# Patient Record
Sex: Female | Born: 1945 | Race: White | Hispanic: No | Smoking: Current every day smoker
Health system: Southern US, Community
[De-identification: ages and names within clinical notes are randomized; demographics above are authoritative.]

## PROBLEM LIST (undated history)

## (undated) DIAGNOSIS — M7070 Other bursitis of hip, unspecified hip: Secondary | ICD-10-CM

## (undated) DIAGNOSIS — T7840XA Allergy, unspecified, initial encounter: Secondary | ICD-10-CM

## (undated) DIAGNOSIS — G5600 Carpal tunnel syndrome, unspecified upper limb: Secondary | ICD-10-CM

## (undated) DIAGNOSIS — C801 Malignant (primary) neoplasm, unspecified: Secondary | ICD-10-CM

## (undated) DIAGNOSIS — S32592A Other specified fracture of left pubis, initial encounter for closed fracture: Secondary | ICD-10-CM

## (undated) DIAGNOSIS — F419 Anxiety disorder, unspecified: Secondary | ICD-10-CM

## (undated) DIAGNOSIS — M719 Bursopathy, unspecified: Secondary | ICD-10-CM

## (undated) DIAGNOSIS — K529 Noninfective gastroenteritis and colitis, unspecified: Secondary | ICD-10-CM

## (undated) DIAGNOSIS — M797 Fibromyalgia: Secondary | ICD-10-CM

## (undated) DIAGNOSIS — M1990 Unspecified osteoarthritis, unspecified site: Secondary | ICD-10-CM

## (undated) DIAGNOSIS — T148XXA Other injury of unspecified body region, initial encounter: Secondary | ICD-10-CM

## (undated) DIAGNOSIS — J189 Pneumonia, unspecified organism: Secondary | ICD-10-CM

## (undated) DIAGNOSIS — E785 Hyperlipidemia, unspecified: Secondary | ICD-10-CM

## (undated) DIAGNOSIS — K589 Irritable bowel syndrome without diarrhea: Secondary | ICD-10-CM

## (undated) DIAGNOSIS — H52209 Unspecified astigmatism, unspecified eye: Secondary | ICD-10-CM

## (undated) DIAGNOSIS — K219 Gastro-esophageal reflux disease without esophagitis: Secondary | ICD-10-CM

## (undated) DIAGNOSIS — N809 Endometriosis, unspecified: Secondary | ICD-10-CM

## (undated) DIAGNOSIS — H269 Unspecified cataract: Secondary | ICD-10-CM

## (undated) DIAGNOSIS — F32A Depression, unspecified: Secondary | ICD-10-CM

## (undated) DIAGNOSIS — K8020 Calculus of gallbladder without cholecystitis without obstruction: Secondary | ICD-10-CM

## (undated) DIAGNOSIS — I10 Essential (primary) hypertension: Secondary | ICD-10-CM

## (undated) DIAGNOSIS — H47323 Drusen of optic disc, bilateral: Secondary | ICD-10-CM

## (undated) DIAGNOSIS — D649 Anemia, unspecified: Secondary | ICD-10-CM

## (undated) DIAGNOSIS — F329 Major depressive disorder, single episode, unspecified: Secondary | ICD-10-CM

## (undated) HISTORY — DX: Other injury of unspecified body region, initial encounter: T14.8XXA

## (undated) HISTORY — DX: Hyperlipidemia, unspecified: E78.5

## (undated) HISTORY — DX: Unspecified astigmatism, unspecified eye: H52.209

## (undated) HISTORY — DX: Fibromyalgia: M79.7

## (undated) HISTORY — DX: Gastro-esophageal reflux disease without esophagitis: K21.9

## (undated) HISTORY — DX: Carpal tunnel syndrome, unspecified upper limb: G56.00

## (undated) HISTORY — DX: Calculus of gallbladder without cholecystitis without obstruction: K80.20

## (undated) HISTORY — DX: Essential (primary) hypertension: I10

## (undated) HISTORY — PX: ELBOW SURGERY: SHX618

## (undated) HISTORY — DX: Unspecified osteoarthritis, unspecified site: M19.90

## (undated) HISTORY — PX: APPENDECTOMY: SHX54

## (undated) HISTORY — DX: Unspecified cataract: H26.9

## (undated) HISTORY — DX: Irritable bowel syndrome, unspecified: K58.9

## (undated) HISTORY — DX: Anemia, unspecified: D64.9

## (undated) HISTORY — DX: Anxiety disorder, unspecified: F41.9

## (undated) HISTORY — DX: Pneumonia, unspecified organism: J18.9

## (undated) HISTORY — DX: Malignant (primary) neoplasm, unspecified: C80.1

## (undated) HISTORY — DX: Bursopathy, unspecified: M71.9

## (undated) HISTORY — DX: Allergy, unspecified, initial encounter: T78.40XA

## (undated) HISTORY — DX: Endometriosis, unspecified: N80.9

## (undated) HISTORY — PX: CARPAL TUNNEL RELEASE: SHX101

## (undated) HISTORY — PX: CYST EXCISION: SHX5701

## (undated) HISTORY — DX: Noninfective gastroenteritis and colitis, unspecified: K52.9

## (undated) HISTORY — PX: LUMBAR FUSION: SHX111

## (undated) HISTORY — DX: Major depressive disorder, single episode, unspecified: F32.9

## (undated) HISTORY — DX: Other bursitis of hip, unspecified hip: M70.70

## (undated) HISTORY — DX: Depression, unspecified: F32.A

## (undated) HISTORY — DX: Drusen of optic disc, bilateral: H47.323

---

## 1999-03-20 ENCOUNTER — Emergency Department (HOSPITAL_COMMUNITY): Admission: EM | Admit: 1999-03-20 | Discharge: 1999-03-20 | Payer: Self-pay | Admitting: Emergency Medicine

## 1999-03-20 ENCOUNTER — Encounter: Payer: Self-pay | Admitting: Emergency Medicine

## 1999-03-30 ENCOUNTER — Other Ambulatory Visit: Admission: RE | Admit: 1999-03-30 | Discharge: 1999-03-30 | Payer: Self-pay | Admitting: Obstetrics and Gynecology

## 2000-07-01 ENCOUNTER — Other Ambulatory Visit: Admission: RE | Admit: 2000-07-01 | Discharge: 2000-07-01 | Payer: Self-pay | Admitting: Obstetrics and Gynecology

## 2001-03-29 ENCOUNTER — Emergency Department (HOSPITAL_COMMUNITY): Admission: EM | Admit: 2001-03-29 | Discharge: 2001-03-29 | Payer: Self-pay | Admitting: Emergency Medicine

## 2001-03-29 ENCOUNTER — Encounter: Payer: Self-pay | Admitting: Emergency Medicine

## 2001-07-10 ENCOUNTER — Emergency Department (HOSPITAL_COMMUNITY): Admission: EM | Admit: 2001-07-10 | Discharge: 2001-07-10 | Payer: Self-pay | Admitting: Emergency Medicine

## 2001-07-10 ENCOUNTER — Encounter: Payer: Self-pay | Admitting: Emergency Medicine

## 2001-09-19 ENCOUNTER — Encounter: Admission: RE | Admit: 2001-09-19 | Discharge: 2001-10-27 | Payer: Self-pay | Admitting: Internal Medicine

## 2001-10-21 ENCOUNTER — Ambulatory Visit (HOSPITAL_COMMUNITY): Admission: RE | Admit: 2001-10-21 | Discharge: 2001-10-21 | Payer: Self-pay | Admitting: Gastroenterology

## 2001-11-06 ENCOUNTER — Other Ambulatory Visit: Admission: RE | Admit: 2001-11-06 | Discharge: 2001-11-06 | Payer: Self-pay | Admitting: Obstetrics and Gynecology

## 2002-09-09 ENCOUNTER — Encounter: Payer: Self-pay | Admitting: Gastroenterology

## 2002-09-09 ENCOUNTER — Encounter: Admission: RE | Admit: 2002-09-09 | Discharge: 2002-09-09 | Payer: Self-pay | Admitting: Gastroenterology

## 2002-09-21 ENCOUNTER — Encounter: Admission: RE | Admit: 2002-09-21 | Discharge: 2002-09-21 | Payer: Self-pay | Admitting: Gastroenterology

## 2002-09-21 ENCOUNTER — Encounter: Payer: Self-pay | Admitting: Gastroenterology

## 2003-03-26 ENCOUNTER — Other Ambulatory Visit: Admission: RE | Admit: 2003-03-26 | Discharge: 2003-03-26 | Payer: Self-pay | Admitting: Obstetrics and Gynecology

## 2004-06-08 ENCOUNTER — Other Ambulatory Visit: Admission: RE | Admit: 2004-06-08 | Discharge: 2004-06-08 | Payer: Self-pay | Admitting: Obstetrics and Gynecology

## 2004-07-22 ENCOUNTER — Inpatient Hospital Stay (HOSPITAL_COMMUNITY): Admission: AD | Admit: 2004-07-22 | Discharge: 2004-07-23 | Payer: Self-pay | Admitting: Obstetrics and Gynecology

## 2004-07-27 ENCOUNTER — Encounter (INDEPENDENT_AMBULATORY_CARE_PROVIDER_SITE_OTHER): Payer: Self-pay | Admitting: Specialist

## 2004-07-27 ENCOUNTER — Ambulatory Visit (HOSPITAL_COMMUNITY): Admission: RE | Admit: 2004-07-27 | Discharge: 2004-07-27 | Payer: Self-pay | Admitting: Obstetrics and Gynecology

## 2004-09-19 ENCOUNTER — Ambulatory Visit: Payer: Self-pay | Admitting: Psychiatry

## 2004-09-19 ENCOUNTER — Encounter: Payer: Self-pay | Admitting: *Deleted

## 2004-09-20 ENCOUNTER — Inpatient Hospital Stay (HOSPITAL_COMMUNITY): Admission: EM | Admit: 2004-09-20 | Discharge: 2004-09-23 | Payer: Self-pay | Admitting: Psychiatry

## 2004-10-12 ENCOUNTER — Other Ambulatory Visit: Admission: RE | Admit: 2004-10-12 | Discharge: 2004-10-12 | Payer: Self-pay | Admitting: Obstetrics and Gynecology

## 2005-09-06 ENCOUNTER — Other Ambulatory Visit: Admission: RE | Admit: 2005-09-06 | Discharge: 2005-09-06 | Payer: Self-pay | Admitting: *Deleted

## 2006-10-24 DIAGNOSIS — C801 Malignant (primary) neoplasm, unspecified: Secondary | ICD-10-CM

## 2006-10-24 HISTORY — DX: Malignant (primary) neoplasm, unspecified: C80.1

## 2006-11-26 ENCOUNTER — Encounter: Admission: RE | Admit: 2006-11-26 | Discharge: 2006-11-26 | Payer: Self-pay | Admitting: Radiology

## 2006-12-02 ENCOUNTER — Encounter: Admission: RE | Admit: 2006-12-02 | Discharge: 2006-12-02 | Payer: Self-pay | Admitting: Family Medicine

## 2006-12-05 ENCOUNTER — Encounter: Admission: RE | Admit: 2006-12-05 | Discharge: 2006-12-05 | Payer: Self-pay | Admitting: Family Medicine

## 2006-12-06 ENCOUNTER — Encounter: Admission: RE | Admit: 2006-12-06 | Discharge: 2006-12-06 | Payer: Self-pay | Admitting: Family Medicine

## 2006-12-10 ENCOUNTER — Encounter: Admission: RE | Admit: 2006-12-10 | Discharge: 2006-12-10 | Payer: Self-pay | Admitting: General Surgery

## 2006-12-11 ENCOUNTER — Encounter (INDEPENDENT_AMBULATORY_CARE_PROVIDER_SITE_OTHER): Payer: Self-pay | Admitting: *Deleted

## 2006-12-11 ENCOUNTER — Ambulatory Visit (HOSPITAL_BASED_OUTPATIENT_CLINIC_OR_DEPARTMENT_OTHER): Admission: RE | Admit: 2006-12-11 | Discharge: 2006-12-11 | Payer: Self-pay | Admitting: General Surgery

## 2006-12-11 HISTORY — PX: BREAST SURGERY: SHX581

## 2006-12-12 ENCOUNTER — Ambulatory Visit: Payer: Self-pay | Admitting: Oncology

## 2006-12-25 LAB — CBC WITH DIFFERENTIAL/PLATELET
EOS%: 1.2 % (ref 0.0–7.0)
Eosinophils Absolute: 0.1 10*3/uL (ref 0.0–0.5)
MCV: 80.6 fL — ABNORMAL LOW (ref 81.0–101.0)
MONO%: 8.4 % (ref 0.0–13.0)
NEUT#: 3.9 10*3/uL (ref 1.5–6.5)
RBC: 4.16 10*6/uL (ref 3.70–5.32)
RDW: 17 % — ABNORMAL HIGH (ref 11.3–14.5)
lymph#: 2.2 10*3/uL (ref 0.9–3.3)

## 2006-12-25 LAB — COMPREHENSIVE METABOLIC PANEL
ALT: 45 U/L — ABNORMAL HIGH (ref 0–35)
BUN: 16 mg/dL (ref 6–23)
Calcium: 9.1 mg/dL (ref 8.4–10.5)
Creatinine, Ser: 0.82 mg/dL (ref 0.40–1.20)
Potassium: 4.4 mEq/L (ref 3.5–5.3)
Sodium: 138 mEq/L (ref 135–145)
Total Bilirubin: 0.4 mg/dL (ref 0.3–1.2)

## 2006-12-25 LAB — LACTATE DEHYDROGENASE: LDH: 235 U/L (ref 94–250)

## 2006-12-26 ENCOUNTER — Ambulatory Visit (HOSPITAL_COMMUNITY): Admission: RE | Admit: 2006-12-26 | Discharge: 2006-12-26 | Payer: Self-pay | Admitting: Oncology

## 2006-12-27 ENCOUNTER — Ambulatory Visit (HOSPITAL_COMMUNITY): Admission: RE | Admit: 2006-12-27 | Discharge: 2006-12-27 | Payer: Self-pay | Admitting: Oncology

## 2006-12-30 ENCOUNTER — Ambulatory Visit: Admission: RE | Admit: 2006-12-30 | Discharge: 2007-03-19 | Payer: Self-pay | Admitting: Radiation Oncology

## 2007-01-03 LAB — CBC & DIFF AND RETIC
BASO%: 0.3 % (ref 0.0–2.0)
Basophils Absolute: 0 10*3/uL (ref 0.0–0.1)
HCT: 33.1 % — ABNORMAL LOW (ref 34.8–46.6)
HGB: 10.8 g/dL — ABNORMAL LOW (ref 11.6–15.9)
LYMPH%: 43.2 % (ref 14.0–48.0)
MCH: 26.6 pg (ref 26.0–34.0)
MCHC: 32.5 g/dL (ref 32.0–36.0)
MONO#: 0.5 10*3/uL (ref 0.1–0.9)
NEUT%: 45.9 % (ref 39.6–76.8)
Platelets: 324 10*3/uL (ref 145–400)
WBC: 5.9 10*3/uL (ref 3.9–10.0)

## 2007-01-03 LAB — MORPHOLOGY

## 2007-01-03 LAB — FERRITIN: Ferritin: 7 ng/mL — ABNORMAL LOW (ref 10–291)

## 2007-01-03 LAB — IRON AND TIBC
%SAT: 8 % — ABNORMAL LOW (ref 20–55)
Iron: 31 ug/dL — ABNORMAL LOW (ref 42–145)
TIBC: 375 ug/dL (ref 250–470)

## 2007-01-06 ENCOUNTER — Ambulatory Visit (HOSPITAL_COMMUNITY): Admission: RE | Admit: 2007-01-06 | Discharge: 2007-01-06 | Payer: Self-pay | Admitting: Oncology

## 2007-01-17 LAB — CBC & DIFF AND RETIC
BASO%: 0.3 % (ref 0.0–2.0)
LYMPH%: 36.3 % (ref 14.0–48.0)
MCHC: 32.7 g/dL (ref 32.0–36.0)
MONO#: 0.7 10*3/uL (ref 0.1–0.9)
Platelets: 270 10*3/uL (ref 145–400)
RBC: 4.18 10*6/uL (ref 3.70–5.32)
Retic %: 1.3 % (ref 0.4–2.3)
WBC: 5.9 10*3/uL (ref 3.9–10.0)

## 2007-01-31 ENCOUNTER — Ambulatory Visit: Payer: Self-pay | Admitting: Oncology

## 2007-01-31 LAB — FECAL OCCULT BLOOD, GUAIAC: Occult Blood: NEGATIVE

## 2007-02-26 ENCOUNTER — Ambulatory Visit: Payer: Self-pay | Admitting: Psychiatry

## 2007-03-03 ENCOUNTER — Ambulatory Visit: Payer: Self-pay | Admitting: Psychiatry

## 2007-03-18 ENCOUNTER — Ambulatory Visit: Payer: Self-pay | Admitting: Oncology

## 2007-03-19 ENCOUNTER — Ambulatory Visit: Admission: RE | Admit: 2007-03-19 | Discharge: 2007-05-11 | Payer: Self-pay | Admitting: Radiation Oncology

## 2007-03-21 LAB — CBC WITH DIFFERENTIAL/PLATELET
BASO%: 0.3 % (ref 0.0–2.0)
EOS%: 2.9 % (ref 0.0–7.0)
HCT: 33 % — ABNORMAL LOW (ref 34.8–46.6)
LYMPH%: 31.5 % (ref 14.0–48.0)
MCH: 27 pg (ref 26.0–34.0)
MCHC: 33.7 g/dL (ref 32.0–36.0)
MONO%: 9.4 % (ref 0.0–13.0)
NEUT%: 55.9 % (ref 39.6–76.8)
Platelets: 275 10*3/uL (ref 145–400)
lymph#: 1.2 10*3/uL (ref 0.9–3.3)

## 2007-03-26 LAB — VITAMIN D PNL(25-HYDRXY+1,25-DIHY)-BLD: Vit D, 25-Hydroxy: 13 ng/mL — ABNORMAL LOW (ref 20–57)

## 2007-03-26 LAB — COMPREHENSIVE METABOLIC PANEL
BUN: 14 mg/dL (ref 6–23)
CO2: 27 mEq/L (ref 19–32)
Calcium: 9.1 mg/dL (ref 8.4–10.5)
Chloride: 101 mEq/L (ref 96–112)
Creatinine, Ser: 0.88 mg/dL (ref 0.40–1.20)
Glucose, Bld: 137 mg/dL — ABNORMAL HIGH (ref 70–99)

## 2007-06-11 ENCOUNTER — Ambulatory Visit: Payer: Self-pay | Admitting: Oncology

## 2007-06-18 LAB — COMPREHENSIVE METABOLIC PANEL
ALT: 12 U/L (ref 0–35)
AST: 17 U/L (ref 0–37)
BUN: 20 mg/dL (ref 6–23)
Calcium: 9.2 mg/dL (ref 8.4–10.5)
Chloride: 106 mEq/L (ref 96–112)
Creatinine, Ser: 0.81 mg/dL (ref 0.40–1.20)
Total Bilirubin: 0.3 mg/dL (ref 0.3–1.2)

## 2007-06-18 LAB — CBC WITH DIFFERENTIAL/PLATELET
BASO%: 0.3 % (ref 0.0–2.0)
Basophils Absolute: 0 10*3/uL (ref 0.0–0.1)
EOS%: 6.5 % (ref 0.0–7.0)
HCT: 29.8 % — ABNORMAL LOW (ref 34.8–46.6)
HGB: 9.9 g/dL — ABNORMAL LOW (ref 11.6–15.9)
LYMPH%: 23.6 % (ref 14.0–48.0)
MCH: 26.9 pg (ref 26.0–34.0)
MCHC: 33.3 g/dL (ref 32.0–36.0)
MCV: 80.7 fL — ABNORMAL LOW (ref 81.0–101.0)
MONO%: 8.2 % (ref 0.0–13.0)
NEUT%: 61.4 % (ref 39.6–76.8)
Platelets: 381 10*3/uL (ref 145–400)
lymph#: 1.2 10*3/uL (ref 0.9–3.3)

## 2007-06-18 LAB — LACTATE DEHYDROGENASE: LDH: 266 U/L — ABNORMAL HIGH (ref 94–250)

## 2007-10-08 ENCOUNTER — Ambulatory Visit: Payer: Self-pay | Admitting: Oncology

## 2007-10-10 LAB — CBC & DIFF AND RETIC
Basophils Absolute: 0 10*3/uL (ref 0.0–0.1)
EOS%: 2.8 % (ref 0.0–7.0)
Eosinophils Absolute: 0.2 10*3/uL (ref 0.0–0.5)
HGB: 11.1 g/dL — ABNORMAL LOW (ref 11.6–15.9)
LYMPH%: 33.6 % (ref 14.0–48.0)
MCH: 27.5 pg (ref 26.0–34.0)
MCV: 79.4 fL — ABNORMAL LOW (ref 81.0–101.0)
MONO%: 9 % (ref 0.0–13.0)
NEUT%: 54.3 % (ref 39.6–76.8)
Platelets: 291 10*3/uL (ref 145–400)
RDW: 16.1 % — ABNORMAL HIGH (ref 11.3–14.5)
RETIC #: 47.4 10*3/uL (ref 19.7–115.1)

## 2007-10-10 LAB — LACTATE DEHYDROGENASE: LDH: 210 U/L (ref 94–250)

## 2007-10-10 LAB — COMPREHENSIVE METABOLIC PANEL
Albumin: 4.1 g/dL (ref 3.5–5.2)
BUN: 19 mg/dL (ref 6–23)
CO2: 22 mEq/L (ref 19–32)
Calcium: 8.7 mg/dL (ref 8.4–10.5)
Chloride: 104 mEq/L (ref 96–112)
Creatinine, Ser: 0.87 mg/dL (ref 0.40–1.20)
Glucose, Bld: 129 mg/dL — ABNORMAL HIGH (ref 70–99)
Potassium: 3.7 mEq/L (ref 3.5–5.3)

## 2007-10-10 LAB — FERRITIN: Ferritin: 5 ng/mL — ABNORMAL LOW (ref 10–291)

## 2007-10-10 LAB — CANCER ANTIGEN 27.29: CA 27.29: 30 U/mL (ref 0–39)

## 2007-10-10 LAB — IRON AND TIBC
Iron: 36 ug/dL — ABNORMAL LOW (ref 42–145)
UIBC: 315 ug/dL

## 2007-10-10 LAB — MORPHOLOGY: PLT EST: ADEQUATE

## 2007-10-21 ENCOUNTER — Ambulatory Visit: Payer: Self-pay | Admitting: Physical Medicine & Rehabilitation

## 2007-10-21 ENCOUNTER — Encounter
Admission: RE | Admit: 2007-10-21 | Discharge: 2007-10-22 | Payer: Self-pay | Admitting: Physical Medicine & Rehabilitation

## 2007-11-03 ENCOUNTER — Encounter: Admission: RE | Admit: 2007-11-03 | Discharge: 2007-12-01 | Payer: Self-pay | Admitting: Oncology

## 2007-11-16 ENCOUNTER — Ambulatory Visit: Payer: Self-pay | Admitting: Cardiovascular Disease

## 2007-11-16 ENCOUNTER — Ambulatory Visit: Payer: Self-pay | Admitting: Emergency Medicine

## 2007-11-16 ENCOUNTER — Inpatient Hospital Stay (HOSPITAL_COMMUNITY): Admission: EM | Admit: 2007-11-16 | Discharge: 2007-12-15 | Payer: Self-pay | Admitting: Emergency Medicine

## 2007-11-18 ENCOUNTER — Encounter: Payer: Self-pay | Admitting: Emergency Medicine

## 2007-11-24 ENCOUNTER — Ambulatory Visit: Payer: Self-pay | Admitting: Vascular Surgery

## 2008-02-10 ENCOUNTER — Ambulatory Visit: Payer: Self-pay | Admitting: Oncology

## 2008-02-27 ENCOUNTER — Encounter: Payer: Self-pay | Admitting: Internal Medicine

## 2008-03-05 ENCOUNTER — Encounter: Payer: Self-pay | Admitting: Critical Care Medicine

## 2008-09-07 ENCOUNTER — Ambulatory Visit: Payer: Self-pay | Admitting: Oncology

## 2008-09-10 ENCOUNTER — Telehealth (INDEPENDENT_AMBULATORY_CARE_PROVIDER_SITE_OTHER): Payer: Self-pay | Admitting: *Deleted

## 2008-09-16 ENCOUNTER — Ambulatory Visit: Payer: Self-pay | Admitting: Internal Medicine

## 2008-09-16 DIAGNOSIS — J984 Other disorders of lung: Secondary | ICD-10-CM

## 2008-09-16 DIAGNOSIS — F341 Dysthymic disorder: Secondary | ICD-10-CM

## 2008-09-16 DIAGNOSIS — Z8719 Personal history of other diseases of the digestive system: Secondary | ICD-10-CM

## 2008-09-16 DIAGNOSIS — R05 Cough: Secondary | ICD-10-CM

## 2008-09-16 DIAGNOSIS — E785 Hyperlipidemia, unspecified: Secondary | ICD-10-CM | POA: Insufficient documentation

## 2008-09-16 LAB — CBC WITH DIFFERENTIAL/PLATELET
BASO%: 0.3 % (ref 0.0–2.0)
LYMPH%: 29.9 % (ref 14.0–48.0)
MCH: 31.9 pg (ref 26.0–34.0)
MCHC: 34.2 g/dL (ref 32.0–36.0)
MCV: 93.3 fL (ref 81.0–101.0)
MONO%: 7.9 % (ref 0.0–13.0)
Platelets: 218 10*3/uL (ref 145–400)
RBC: 4.26 10*6/uL (ref 3.70–5.32)

## 2008-09-17 LAB — COMPREHENSIVE METABOLIC PANEL
ALT: 14 U/L (ref 0–35)
Alkaline Phosphatase: 59 U/L (ref 39–117)
Glucose, Bld: 103 mg/dL — ABNORMAL HIGH (ref 70–99)
Sodium: 140 mEq/L (ref 135–145)
Total Bilirubin: 0.4 mg/dL (ref 0.3–1.2)
Total Protein: 7.2 g/dL (ref 6.0–8.3)

## 2008-09-17 LAB — VITAMIN D 25 HYDROXY (VIT D DEFICIENCY, FRACTURES): Vit D, 25-Hydroxy: 14 ng/mL — ABNORMAL LOW (ref 30–89)

## 2008-09-17 LAB — CANCER ANTIGEN 27.29: CA 27.29: 31 U/mL (ref 0–39)

## 2008-09-20 ENCOUNTER — Ambulatory Visit: Payer: Self-pay | Admitting: Cardiovascular Disease

## 2008-09-28 ENCOUNTER — Telehealth (INDEPENDENT_AMBULATORY_CARE_PROVIDER_SITE_OTHER): Payer: Self-pay | Admitting: *Deleted

## 2008-10-21 ENCOUNTER — Ambulatory Visit: Payer: Self-pay | Admitting: Internal Medicine

## 2008-12-22 ENCOUNTER — Telehealth (INDEPENDENT_AMBULATORY_CARE_PROVIDER_SITE_OTHER): Payer: Self-pay | Admitting: *Deleted

## 2008-12-28 ENCOUNTER — Ambulatory Visit: Payer: Self-pay | Admitting: Internal Medicine

## 2009-02-10 ENCOUNTER — Telehealth (INDEPENDENT_AMBULATORY_CARE_PROVIDER_SITE_OTHER): Payer: Self-pay | Admitting: *Deleted

## 2009-03-15 ENCOUNTER — Encounter: Admission: RE | Admit: 2009-03-15 | Discharge: 2009-03-15 | Payer: Self-pay | Admitting: General Surgery

## 2009-04-06 ENCOUNTER — Ambulatory Visit: Payer: Self-pay | Admitting: Oncology

## 2009-04-08 LAB — CBC & DIFF AND RETIC
BASO%: 0.3 % (ref 0.0–2.0)
LYMPH%: 38.8 % (ref 14.0–49.7)
MCHC: 34.9 g/dL (ref 31.5–36.0)
MONO#: 0.3 10*3/uL (ref 0.1–0.9)
MONO%: 7.2 % (ref 0.0–14.0)
Platelets: 198 10*3/uL (ref 145–400)
RBC: 4.17 10*6/uL (ref 3.70–5.45)
RDW: 12.9 % (ref 11.2–14.5)
Retic %: 1.6 % (ref 0.4–2.3)
WBC: 4.6 10*3/uL (ref 3.9–10.3)

## 2009-04-11 LAB — COMPREHENSIVE METABOLIC PANEL
ALT: 14 U/L (ref 0–35)
AST: 15 U/L (ref 0–37)
Albumin: 4.2 g/dL (ref 3.5–5.2)
Alkaline Phosphatase: 59 U/L (ref 39–117)
BUN: 15 mg/dL (ref 6–23)
CO2: 27 mEq/L (ref 19–32)
Calcium: 9.2 mg/dL (ref 8.4–10.5)
Chloride: 102 mEq/L (ref 96–112)
Creatinine, Ser: 0.83 mg/dL (ref 0.40–1.20)
Glucose, Bld: 113 mg/dL — ABNORMAL HIGH (ref 70–99)
Potassium: 3.9 mEq/L (ref 3.5–5.3)
Sodium: 138 mEq/L (ref 135–145)
Total Bilirubin: 0.3 mg/dL (ref 0.3–1.2)
Total Protein: 7 g/dL (ref 6.0–8.3)

## 2009-04-11 LAB — LACTATE DEHYDROGENASE: LDH: 169 U/L (ref 94–250)

## 2009-04-11 LAB — IRON AND TIBC
TIBC: 282 ug/dL (ref 250–470)
UIBC: 205 ug/dL

## 2009-04-11 LAB — FERRITIN: Ferritin: 125 ng/mL (ref 10–291)

## 2009-04-11 LAB — CANCER ANTIGEN 27.29: CA 27.29: 27 U/mL (ref 0–39)

## 2009-04-11 LAB — VITAMIN D 25 HYDROXY (VIT D DEFICIENCY, FRACTURES): Vit D, 25-Hydroxy: 31 ng/mL (ref 30–89)

## 2009-04-26 ENCOUNTER — Ambulatory Visit: Payer: Self-pay | Admitting: Psychiatry

## 2009-05-11 ENCOUNTER — Ambulatory Visit: Payer: Self-pay | Admitting: Psychiatry

## 2009-05-18 ENCOUNTER — Ambulatory Visit: Payer: Self-pay | Admitting: Psychiatry

## 2009-06-09 ENCOUNTER — Ambulatory Visit: Payer: Self-pay | Admitting: Psychiatry

## 2009-06-16 ENCOUNTER — Ambulatory Visit: Payer: Self-pay | Admitting: Psychiatry

## 2009-07-06 ENCOUNTER — Ambulatory Visit: Payer: Self-pay | Admitting: Psychiatry

## 2009-07-14 ENCOUNTER — Encounter: Admission: RE | Admit: 2009-07-14 | Discharge: 2009-09-20 | Payer: Self-pay | Admitting: Family Medicine

## 2009-07-21 ENCOUNTER — Ambulatory Visit: Payer: Self-pay | Admitting: Psychiatry

## 2009-08-04 ENCOUNTER — Ambulatory Visit: Payer: Self-pay | Admitting: Psychiatry

## 2009-08-18 ENCOUNTER — Ambulatory Visit: Payer: Self-pay | Admitting: Psychiatry

## 2009-10-04 ENCOUNTER — Ambulatory Visit: Payer: Self-pay | Admitting: Psychiatry

## 2009-10-19 ENCOUNTER — Ambulatory Visit: Payer: Self-pay | Admitting: Oncology

## 2009-10-21 LAB — CBC WITH DIFFERENTIAL/PLATELET
Basophils Absolute: 0 10*3/uL (ref 0.0–0.1)
Eosinophils Absolute: 0.1 10*3/uL (ref 0.0–0.5)
HCT: 40.1 % (ref 34.8–46.6)
HGB: 13.6 g/dL (ref 11.6–15.9)
LYMPH%: 34.3 % (ref 14.0–49.7)
MONO#: 0.4 10*3/uL (ref 0.1–0.9)
NEUT%: 55.3 % (ref 38.4–76.8)
Platelets: 231 10*3/uL (ref 145–400)
WBC: 5.3 10*3/uL (ref 3.9–10.3)
lymph#: 1.8 10*3/uL (ref 0.9–3.3)

## 2009-10-22 LAB — COMPREHENSIVE METABOLIC PANEL
ALT: 12 U/L (ref 0–35)
BUN: 14 mg/dL (ref 6–23)
CO2: 28 mEq/L (ref 19–32)
Calcium: 9.1 mg/dL (ref 8.4–10.5)
Chloride: 101 mEq/L (ref 96–112)
Creatinine, Ser: 0.84 mg/dL (ref 0.40–1.20)
Glucose, Bld: 111 mg/dL — ABNORMAL HIGH (ref 70–99)

## 2009-10-22 LAB — CANCER ANTIGEN 27.29: CA 27.29: 22 U/mL (ref 0–39)

## 2009-10-22 LAB — LACTATE DEHYDROGENASE: LDH: 182 U/L (ref 94–250)

## 2009-10-26 ENCOUNTER — Ambulatory Visit: Payer: Self-pay | Admitting: Psychiatry

## 2010-04-20 ENCOUNTER — Ambulatory Visit: Payer: Self-pay | Admitting: Oncology

## 2010-04-21 LAB — CBC WITH DIFFERENTIAL/PLATELET
BASO%: 0.2 % (ref 0.0–2.0)
Eosinophils Absolute: 0.1 10*3/uL (ref 0.0–0.5)
LYMPH%: 39.3 % (ref 14.0–49.7)
MCH: 30 pg (ref 25.1–34.0)
MCV: 89.3 fL (ref 79.5–101.0)
MONO%: 7.9 % (ref 0.0–14.0)
Platelets: 189 10*3/uL (ref 145–400)
RDW: 13.1 % (ref 11.2–14.5)
lymph#: 2.2 10*3/uL (ref 0.9–3.3)

## 2010-04-21 LAB — COMPREHENSIVE METABOLIC PANEL
CO2: 23 mEq/L (ref 19–32)
Chloride: 105 mEq/L (ref 96–112)
Glucose, Bld: 119 mg/dL — ABNORMAL HIGH (ref 70–99)
Potassium: 4.1 mEq/L (ref 3.5–5.3)
Sodium: 140 mEq/L (ref 135–145)
Total Bilirubin: 0.3 mg/dL (ref 0.3–1.2)
Total Protein: 7 g/dL (ref 6.0–8.3)

## 2010-07-19 ENCOUNTER — Encounter: Admission: RE | Admit: 2010-07-19 | Discharge: 2010-07-19 | Payer: Self-pay | Admitting: Family Medicine

## 2010-08-16 ENCOUNTER — Encounter
Admission: RE | Admit: 2010-08-16 | Discharge: 2010-11-14 | Payer: Self-pay | Admitting: Physical Medicine & Rehabilitation

## 2010-08-19 ENCOUNTER — Encounter: Payer: Self-pay | Admitting: Cardiovascular Disease

## 2010-08-22 ENCOUNTER — Ambulatory Visit: Payer: Self-pay | Admitting: Cardiovascular Disease

## 2010-08-22 DIAGNOSIS — F329 Major depressive disorder, single episode, unspecified: Secondary | ICD-10-CM

## 2010-08-22 DIAGNOSIS — K449 Diaphragmatic hernia without obstruction or gangrene: Secondary | ICD-10-CM | POA: Insufficient documentation

## 2010-08-22 DIAGNOSIS — R079 Chest pain, unspecified: Secondary | ICD-10-CM

## 2010-08-22 DIAGNOSIS — D649 Anemia, unspecified: Secondary | ICD-10-CM

## 2010-08-22 DIAGNOSIS — L259 Unspecified contact dermatitis, unspecified cause: Secondary | ICD-10-CM | POA: Insufficient documentation

## 2010-08-22 DIAGNOSIS — R7309 Other abnormal glucose: Secondary | ICD-10-CM

## 2010-09-12 ENCOUNTER — Telehealth (INDEPENDENT_AMBULATORY_CARE_PROVIDER_SITE_OTHER): Payer: Self-pay | Admitting: *Deleted

## 2010-11-06 ENCOUNTER — Ambulatory Visit: Payer: Self-pay | Admitting: Oncology

## 2010-11-08 LAB — COMPREHENSIVE METABOLIC PANEL
BUN: 13 mg/dL (ref 6–23)
CO2: 30 mEq/L (ref 19–32)
Creatinine, Ser: 0.78 mg/dL (ref 0.40–1.20)
Glucose, Bld: 120 mg/dL — ABNORMAL HIGH (ref 70–99)
Total Bilirubin: 0.4 mg/dL (ref 0.3–1.2)

## 2010-11-08 LAB — CBC WITH DIFFERENTIAL/PLATELET
Eosinophils Absolute: 0.1 10*3/uL (ref 0.0–0.5)
HCT: 39.2 % (ref 34.8–46.6)
LYMPH%: 46.1 % (ref 14.0–49.7)
MCHC: 33.5 g/dL (ref 31.5–36.0)
MONO#: 0.4 10*3/uL (ref 0.1–0.9)
NEUT#: 2 10*3/uL (ref 1.5–6.5)
NEUT%: 42.8 % (ref 38.4–76.8)
Platelets: 258 10*3/uL (ref 145–400)
WBC: 4.6 10*3/uL (ref 3.9–10.3)

## 2010-11-08 LAB — LACTATE DEHYDROGENASE: LDH: 177 U/L (ref 94–250)

## 2010-11-09 LAB — IRON AND TIBC
TIBC: 289 ug/dL (ref 250–470)
UIBC: 237 ug/dL

## 2010-11-09 LAB — CANCER ANTIGEN 27.29: CA 27.29: 19 U/mL (ref 0–39)

## 2010-12-08 ENCOUNTER — Ambulatory Visit: Payer: Self-pay | Admitting: Oncology

## 2010-12-28 ENCOUNTER — Encounter
Admission: RE | Admit: 2010-12-28 | Discharge: 2010-12-28 | Payer: Self-pay | Source: Home / Self Care | Attending: Unknown Physician Specialty | Admitting: Unknown Physician Specialty

## 2011-01-23 NOTE — Assessment & Plan Note (Signed)
Summary: chest pain/ family hx heart attacks/mt   Referring Provider:  Dr. Prince Rome Primary Provider:  [facility]   History of Present Illness: [name] is seen today for SSCP.  She is very anxious.  She had a few minutes of SSCP last Friday.  She has CRF's including elevated lipids.  Pain left sided and associated with diaphoresis but no SOB.  She has positive family history of CAD.  She has fibromyalgia and gout in the ankles and cannot walk on a treadmill.  She has just started Rx for a UTI and has colitis which gives her diarhea from time to time including this past weekend.  Denies palpitaiotns, syncope, PND orthopnea or edema.  Her daughter is having a baby boy in the next 2 days and she is very anxious about this  Current Problems (verified): 1)  Chest Pain  (ICD-786.50) 2)  Hyperlipidemia  (ICD-272.4) 3)  Hiatal Hernia  (ICD-553.3) 4)  Depression  (ICD-311) 5)  Bipolar Affective Disorder, Hx of  (ICD-V11.8) 6)  Hyperglycemia  (ICD-790.29) 7)  Dermatitis  (ICD-692.9) 8)  Anemia  (ICD-285.9) 9)  Cough  (ICD-786.2) 10)  Anxiety Depression  (ICD-300.4) 11)  Adult Respiratory Distress Syndrome  (ICD-518.82) 12)  Neoplasm, Malignant, Breast  (ICD-174.9) 13)  Hx of Gallstone  (ICD-V12.79)  Current Medications (verified): 1)  Wellbutrin Xl 300 Mg Xr24h-Tab (Bupropion Hcl) .... Once Daily 2)  Ultram 50 Mg Tabs (Tramadol Hcl) .Marland Kitchen.. 1 - 2 Up To Every 4 Hours If Needed For Cough 3)  Flexeril 10 Mg Tabs (Cyclobenzaprine Hcl) .Marland Kitchen.. 1 Three Times A Day As Needed 4)  Bentyl 10 Mg Caps (Dicyclomine Hcl) .Marland Kitchen.. 1 Once Daily As Needed 5)  Calcium .Marland Kitchen.. 1 Tab By Mouth Once Daily 6)  Lexapro 20 Mg Tabs (Escitalopram Oxalate) .Marland Kitchen.. 1 Tab By Mouth Once Daily 7)  Prevacid 30 Mg Cpdr (Lansoprazole) .Marland Kitchen.. 1  Tab By Mouth Once Daily 8)  Zocor 20 Mg Tabs (Simvastatin) .Marland Kitchen.. 1 Tab By Mouth Once Daily  Allergies (verified): No Known Drug Allergies  Past History:  Past Medical History: Last updated:  08/22/2010 CHEST PAIN  HYPERLIPIDEMIA  HIATAL HERNIA  DEPRESSION  BIPOLAR AFFECTIVE DISORDER, HX OF  HYPERGLYCEMIA DERMATITIS  ANEMIA  COUGH  ANXIETY DEPRESSION  ADULT RESPIRATORY DISTRESS SYNDROME  NEOPLASM, MALIGNANT, BREAST  HX OF GALLSTONE      COUGH onset 11/08.................................................................Marland KitchenWert    - Sinus CT 09/10/08  neg  Past Surgical History: Last updated: 09/16/2008 Appendectomy Breast Surgery nov 07 plus RT no chemo finished RT april 08: .Marland KitchenMarland KitchenMarland Kitchenfollowed by Donnie Coffin  Family History: Last updated: 09/16/2008 Both parents had heart dz Mother had emphysema Father had colon cancer  Social History: Last updated: 09/16/2008 Former smoker, quit in November 2008  She smoked 1 ppd x 1 yr No ETOH Quit using drugs in 1989  Review of Systems       Denies fever, malais, weight loss, blurry vision, decreased visual acuity, cough, sputum, SOB, hemoptysis, pleuritic pain, palpitaitons, heartburn, abdominal pain, melena, lower extremity edema, claudication, or rash.   Vital Signs:  Patient profile:   65 year old female Height:      66 inches Weight:      163 pounds BMI:     26.40 Pulse rate:   93 / minute Resp:     14 per minute BP sitting:   121 / 82  (left arm)  Vitals Entered By: Kem Parkinson (August 22, 2010 2:04 PM)  Physical Exam  General:  Affect appropriate  Healthy:  appears stated age HEENT: normal Neck supple with no adenopathy JVP normal no bruits no thyromegaly Lungs clear with no wheezing and good diaphragmatic motion Heart:  S1/S2 no murmur,rub, gallop or click PMI normal Abdomen: benighn, BS positve, no tenderness, no AAA no bruit.  No HSM or HJR Distal pulses intact with no bruits No edema Neuro non-focal Skin warm and dry    Impression & Recommendations:  Problem # 1:  CHEST PAIN (ICD-786.50) Atypical but 2 CRF's.  Unable to walk due to fibromyalgia and gout in ankles.  Lexiscan  myovue Orders: Nuclear Stress Test (Nuc Stress Test)  Problem # 2:  HYPERLIPIDEMIA (ICD-272.4) Continue statin labs per primary Her updated medication list for this problem includes:    Zocor 20 Mg Tabs (Simvastatin) .Marland Kitchen... 1 tab by mouth once daily  Patient Instructions: 1)  Your physician recommends that you schedule a follow-up appointment in:  AS NEEDED PENDING TEST RESULTS 2)  Your physician has requested that you have an LEXISCAN myoview.  For further information please visit [email or web address]  Please follow instruction sheet, as given.   EKG Report  Procedure date:  08/22/2010  Findings:      NSR 93 Normal ECG

## 2011-01-23 NOTE — Letter (Signed)
Summary: [facility]  [facility]   Imported By: Marylou Mccoy 08/22/2010 09:12:20  _____________________________________________________________________  External Attachment:    Type:   Image     Comment:   External Document

## 2011-01-23 NOTE — Progress Notes (Signed)
Summary: Nuclear Pre-Procedure  Phone Note Outgoing Call   Call placed by: Milana Na, EMT-P,  September 12, 2010 3:01 PM Summary of Call: Left message with information on Myoview Information Sheet (see scanned document for details).      Nuclear Med Background Indications for Stress Test: Evaluation for Ischemia     Symptoms: Chest Pain    Nuclear Pre-Procedure Cardiac Risk Factors: Family History - CAD, History of Smoking, Lipids Height (in): 66  Nuclear Med Study Referring MD:  P.Nishan     Appended Document: Nuclear Pre-Procedure Pt called and cancelled due to illness(strep throat). Stated will call back to reschedule.

## 2011-03-06 ENCOUNTER — Other Ambulatory Visit: Payer: Self-pay | Admitting: Family Medicine

## 2011-03-06 DIAGNOSIS — M25572 Pain in left ankle and joints of left foot: Secondary | ICD-10-CM

## 2011-03-07 ENCOUNTER — Other Ambulatory Visit: Payer: Self-pay | Admitting: Family Medicine

## 2011-03-07 DIAGNOSIS — M25572 Pain in left ankle and joints of left foot: Secondary | ICD-10-CM

## 2011-03-09 ENCOUNTER — Other Ambulatory Visit: Payer: Self-pay

## 2011-03-14 ENCOUNTER — Ambulatory Visit
Admission: RE | Admit: 2011-03-14 | Discharge: 2011-03-14 | Disposition: A | Discharge status: Home / Self Care | Payer: Medicare Other | Source: Ambulatory Visit | Attending: Family Medicine | Admitting: Family Medicine

## 2011-03-14 DIAGNOSIS — M25572 Pain in left ankle and joints of left foot: Secondary | ICD-10-CM

## 2011-05-08 NOTE — Consult Note (Signed)
NAMELILANA, [name]                ACCOUNT NO.:  [id number]   MEDICAL RECORD NO.:  [id number]          PATIENT TYPE:  INP   LOCATION:  1514                         FACILITY:  [facility]   PHYSICIAN:  Antonietta Breach, M.D.  DATE OF BIRTH:  07-05-1946   DATE OF CONSULTATION:  12/09/2007  DATE OF DISCHARGE:                                 CONSULTATION   CONSULTATION FOLLOW-UP:  [name] continues with impaired judgment  and fluctuations in her cognitive process.  She is not able to provide  the day of the week or the day of the month.  Her judgment is impaired.  She will confabulate at times.  Also she is not able to rationally  appreciate her deficits and the risks that would be involved such as  potential self-neglect.   Her mood is within normal limits, although she continues to have slight  fatigue.  She is not having any adverse medication effects.  She has  constructive future goals and interests are intact.   VITAL SIGNS:  Temperature 97.0, pulse 95, respiratory rate 20, blood  pressure 106/54.  MENTAL STATUS EXAM:  [name] is alert.  Her eye contact is good.  Her concentration is mildly decreased.  On orientation testing she does  not know the day of the week or the day of the month.  She has poor  memory and cannot retain the instruction or basic information of the  conversation that the undersigned has with her.  Later in the rounding  period, the patient comes up to the nursing station and asks questions  clearly reflecting that she does not recall information.  Her speech is  involving a slightly flat prosody.  Thought process as in the history of  thought content, no thoughts of harming herself, no thoughts of harming  others, no delusions, no hallucinations.  Judgment is impaired.  Insight  is poor.   ASSESSMENT:  1. 293.00, delirium, not otherwise specified, improved.  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  Like other patients, the patient may eventually regain      her cognitive and memory baseline through subsequent weeks of      thiamine treatment; however, by 4 weeks of thiamine treatment if      she is not improved, her prognosis of regaining her memory and      cognitive function is poor.  At that point would consider Namenda      and/or Aricept.  2. 293.83, mood disorder, not otherwise specified.  The patient may      have a history of bipolar disorder.  This was mentioned as a      possibility in previous psychiatric evaluation.  As the patient      regains her memory, this can be ascertained.  For now, would      proceed with a decrease of the Depakote to 500 mg q.h.s. in an  effort to reduce the possibility of Depakote fatigue.  Would      continue to monitor periodic CBC and liver function panel for      screening Depakote adverse effects.  Would slowly taper off the      Klonopin.  Would continue the patient's Effexor 37.5 mg b.i.d.,      augmenting it with Wellbutrin 150 mg b.i.d. for antidepression.   The patient could possibly be tapered off of her Depakote over the long  term by her outpatient psychiatrist if it is confirmed that she has no  history of mania or hypomania and her mood continues to remain stable.   The patient is displaying ongoing severe impairment in judgment as well  as memory difficulty.  She does not have the capacity for informed  consent.   She will require an assisted living level memory-impaired environment  until she regains her memory and cognitive function.   Would continue the thiamine 100 mg daily indefinitely.   Concerning follow-up, the patient should have her mental status  rechecked every week.  Outpatient psychiatric follow-up can be obtained  at one of the clinics attached to [facility], [facility] or  [facility].  There are also private  psychiatrists available.      Antonietta Breach, M.D.  Electronically Signed     JW/MEDQ  D:  12/10/2007  T:  12/10/2007  Job:  [id number]

## 2011-05-08 NOTE — Discharge Summary (Signed)
[name], Lori                ACCOUNT NO.:  [id number]   MEDICAL RECORD NO.:  [id number]          PATIENT TYPE:  INP   LOCATION:  1226                         FACILITY:  [facility]   PHYSICIAN:  Weatherman B. Lori Sires, MD, FCCPDATE OF BIRTH:  1946-07-12   DATE OF ADMISSION:  11/16/2007  DATE OF DISCHARGE:  11/29/2007                               DISCHARGE SUMMARY   INTERIM DISCHARGE SUMMARY:   DATE OF CRITICAL CARE SIGNOFF:  November 29, 2007   DISCHARGE DIAGNOSES:  1. Status post acute respiratory failure secondary to community-      acquired pneumonia (no organism specified with resultant      fibroproliferative acute respiratory distress syndrome).  2. Delirium with toxic metabolic encephalopathy superimposed on      underlying bipolar disease and narcotic dependency.  3. Left upper extremity superficial thrombus along the peripherally      inserted central catheter line from insertion site to the axilla      noted in the cephalic vein.  4. Anemia.  5. Candida dermatitis.  6. Hyperglycemia.  7. History of breast cancer.  8. Elevated liver function tests.   LINES AND TUBES:  1. On November 17, 2007, oral endotracheal tube placed and removed      November 24, 2007.  2. Left upper extremity PICC line placed November 17, 2007 and removed      November 25, 2007.  3. Left radial A line placed on November 17, 2007 and removed November 24, 2007.  4. Oral endotracheal tube placed November 24, 2007 and removed November 27, 2007.  5. Right internal jugular vein central venous catheter placed November 25, 2007.   LABORATORY DATA:  On November 28, 2007, AST 41, ALT 53, total bilirubin  0.6, alkaline phosphatase 153.  Hemoglobin 9.1, hematocrit 27.2, white  blood cell count 9.2, platelet count 455.  On November 27, 2007, sodium  137, potassium 4.6, chloride 97, bicarbonate 35, glucose 112, BUN 16,  creatinine 0.58.   MICROBIOLOGY:  On November 16, 2007, blood cultures x2  negative.  On  November 16, 2007, urine culture negative.  On November 17, 2007, urine  Legionella and urine Streptococcus antigens both negative.  On November 17, 2007, bronchioalveolar lavage negative.  On November 19, 2007, blood  cultures x2 negative.  On November 19, 2007, urine cultures negative.  On November 21, 2007 and November 22, 2007, sputum and bronchioalveolar  lavage demonstrating few Candida.   IMAGING DATA:  Chest x-ray obtained on November 27, 2007 demonstrating  diffuse pulmonary infiltrates consistent with acute lung injury, notable  improved aeration over the last 48 hours.  Endotracheal tube has been  removed compared with prior film.   BRIEF HISTORY:  [name] is a 65 year old white female with a history  of smoking, breast cancer on the right status post radiation therapy and  lumpectomy, history of substance abuse and depression, and bipolar  disease and narcotic dependent since November 30, 2007.  14 days prior to  admission, she stopped taking  her narcotics after having failed a taper  withdrawal as prescribed by her pain specialist.  She developed a cough  which was productive of purulent tan secretions.  She presented to the  hospital on November 16, 2007 and was started on empiric azithromycin  and Rocephin.  Vancomycin was added on November 17, 2007.  She developed  decline in her respiratory status on November 17, 2007.  She was moved  to intensive care on 70% nonbreather mask.  The pulmonary critical care  team was asked to evaluate her at that point.   HOSPITAL COURSE:  Problem 1.  Acute respiratory failure secondary to  community-acquired pneumonia (no organism specified, with resultant  fibroproliferative ARDS).  Ms. Carrender was admitted to the critical care  unit at [facility].  She was placed on mechanical ventilation  on November 17, 2007.  Therapeutic interventions included sedation  protocol, IV empiric antibiotics consisting of  Rocephin from November 16, 2007 to November 26, 2007, Zithromax from November 16, 2007 to  November 22, 2007, and IV vancomycin from November 17, 2007 to November  38, 2008.  She was ventilated on low tidal volume ventilation per  standard of care and ARDS therapy.  Her ventilator mechanics actually  improved to the point where she was extubated on November 24, 2007.  She  had marked upper airway stridor.  It was unclear how much of this was  dynamic airway collapse versus upper airway inflammation.  She developed  precipitous respiratory failure.  Her chest x-ray demonstrated what  appeared to be acute pulmonary edema superimposed on underlying  resolving acute lung injury.  It was presumed at that time that she had  developed negative pressure pulmonary edema post extubation.  She was  therefore reintubated on November 24, 2007 shortly after extubation.  She  remained again on mechanical ventilation up until November 27, 2007.  Up  to that time, therapeutic changes in her management regimen focused  primarily on control of her agitation.  It was felt that her agitation  and delirium were major obstacles to successful extubation.  She was  successfully extubated on November 27, 2007.  Since that time, she  continues to have diffuse pulmonary infiltrates on chest x-ray.  We did  trial a 48-hour course of IV Solu-Medrol which it is unclear as to how  much this contributed to improvement in her chest x-ray; however, it was  felt that it was precipitating worsening delirium and was therefore  discontinued.  Upon time of critical care signoff, Ms. Bilyeu is  currently on 2 liters nasal cannula, saturating in the 92-93% range.  Her chest x-ray continues as of November 27, 2007 to demonstrate diffuse  pulmonary infiltrates likely residual fibroproliferative ARDS.  The  recommendations at this point would be to continue pulmonary toilet,  wean FiO2 as tolerated, and continue diuresis as her BUN,  creatinine,  and blood pressure will allow.  Additionally, we will set up speech-  language pathology consultation to evaluate swallowing function.  She  remains a high risk for aspiration at this point and therefore will  continue n.p.o. status until swallowing function is objectively  evaluated.   Problem 2.  Delirium.  Toxic metabolic encephalopathy, superimposed on  underlying bipolar disease, depression, and narcotic dependency.  As  previously said, Ms. Liggett had been undergoing a guided narcotic  withdrawal program in the outpatient setting.  Certainly, her agitation  and anxiolytic/depression/narcotic requirements have been a major  obstacle in carrying  Ms. Pettitt in the acute setting.  Upon time of  critical care signoff, Ms. Fant remains delirious, with occasional  visual hallucinations and erratic behavior.  Psychiatry was consulted on  November 27, 2007.  Dr. Jeanie Sewer has made recommendations.  Upon time of  critical care signoff, Ms. Enwright primary concerns remain to be  delirium and safety.  She is a relatively large fall risk.  Upon time of  critical care signoff, current plan of care will be to order a bedtime  sitter for close observation and prevention of falls as well as to  continue the current anxiolytic, narcotic, and antipsychotic regimen as  outlined by psychiatry.   Problem 3.  Superficial thrombus in the left upper extremity cephalic  vein.  Ms. Sanagustin had a left upper extremity cephalically placed PICC  line which was placed on November 17, 2007.  She began to complain of  significant discomfort particularly to palpitation on November 24, 2007.  The site was noted to be erythematous and slightly warm to the touch.  A  venous ultrasound was obtained that demonstrated a superficial venous  thrombus extending from the level of the PICC insertion site all the way  up to the axilla.  Therefore, therapeutic low molecular weight heparin  was initiated, and  the left upper extremity was discontinued.  She now  has a right internal jugular vein central venous catheter in place.   Problem 4.  Anemia.  Current hemoglobin is 9.1, hematocrit 27.2.  There  has been no evidence of bleeding on therapeutic dose low molecular  weight heparin.  We will continue with this as well as proton pump  inhibitors, with plan to periodically trend CBC.   Problem 5.  Candida dermatitis in the groin.  She is now on day #4 of  Diflucan and nystatin powder.  Clinically, this is continuing to  improve.  Plan for this is to continue treatment x7-10 days.   Problem 6.  Hyperglycemia.  Plan for this is to continue sliding scale  insulin.   Problem 7.  Elevated LFTs.  This was felt to be primarily secondary to  medication administration.  Of note, on initiation of Depakote therapy,  we have noticed a small rise in her AST and ALT which had previously  been trending down.  Plan for this would be to continue to trend LFTs.   Problem 8.  History of breast cancer status post radiation therapy and  lumpectomy on the right.  Plan for this is to observe the typical  precautions as outlined by hospital protocol.   DIET:  Upon time of critical care signoff, diet is currently n.p.o.  until evaluated by speech therapy.  The plan at this point will be to  either continue n.p.o. and resume tube feedings or advance diet per  recommendations of speech therapy.   DISCHARGE MEDICATIONS:  1. Diflucan 150 mg p.o. daily x7 days.  2. Catapres 0.2 mg tablet q.8h.  3. Combivent inhaler 2 puffs q.6h.  4. Depakote 500 mg p.o. or IV q.h.s.  5. Haldol 1 mg via tube q.8h.  6. Depakote 250 mg p.o. daily at 4 p.m.  7. Duragesic patch 100 mcg an hour.  8. Klonopin 2 mg p.o. q.6h.  9. Effexor 37.5 mg via tube b.i.d.  10.Wellbutrin 150 mg via tube b.i.d.  11.Reglan 10 mg IV q.6h.  12.Protonix 40 mg via tube daily.  13.Currently she is on Lovenox 70 mg subcutaneously every 12 hours.   14.Lantus insulin  5 units subcutaneously every 12 hours.  15.Sliding scale insulin.  16.Haldol p.r.n.  17.Fentanyl p.r.n.  18.Ultram p.r.n.   DISPOSITION:  Ms. Trostel remains acutely ill; however, she is no longer  critically ill.  Her care at this point will be signed over to the  [facility] team, internal medicine for internal medicine who will  reassume her care.      Zenia Resides, NP      Charlaine Dalton. Lori Sires, MD, [facility]  Electronically Signed    PB/MEDQ  D:  11/28/2007  T:  11/28/2007  Job:  [id number]

## 2011-05-08 NOTE — Consult Note (Signed)
NAMEMANREET, [name]                ACCOUNT NO.:  [id number]   MEDICAL RECORD NO.:  [id number]          PATIENT TYPE:  INP   LOCATION:  1514                         FACILITY:  [facility]   PHYSICIAN:  Antonietta Breach, M.D.  DATE OF BIRTH:  Aug 14, 1946   DATE OF CONSULTATION:  12/12/2007  DATE OF DISCHARGE:  12/01/2007                                 CONSULTATION   [name] continues with memory impairment.  She was addressing the  undersigned and stated that he has not seen her the whole time she has  been here at the hospital.  She is refusing some of her medication and  believing that she is going to be poisoned.  She believes there is a  conspiracy going on to make money by keeping her here.  She also has  reported delusions that there are rats in the room above hers.  The  onset of these psychotic symptoms has been within the past 24 hours.   The patient continues with impaired judgment and poor insight.  She also  has difficulty with time orientation.   She wanders up and down the hallway but is redirectable by staff back to  her room.  She is not combative.   The organic workup has been negative, including the brain MRI with and  without contrast which was done on December 13 showing no acute or  reversible brain pathology.  There were minor chronic small vessel type  changes in the white matter.  The radiologist stated that these appeared  the same and are not likely of clinical relevance.   PHYSICAL EXAMINATION:  VITAL SIGNS:  Temperature 98.2, pulse 87,  respiratory rate 18, blood pressure 114/60, O2 saturation on room air  99%.   MENTAL STATUS EXAM:  As above.  The patient is alert.  She is oriented  to person and place but has difficulty with time orientation.  Thought  process involves some confabulation.  Please see the above for other  elements of the mental status exam.   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  The patient is now  beyond the acute phase of delirium.  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  She may have a Korsakoff  dementia; however, it is premature to state this since it is possible  that she could respond yet to her thiamine.  Some cases will continue to  respond to thiamine as far out as 4 weeks after stopping alcohol.   The psychotic symptoms above are new in onset within the past 24 hours.  The patient's thought process is much more organized than the acute  delirium phase, also she does not wax and wane with her level of  alertness.  Therefore, the dementia NOS category is utilized.   RECOMMENDATIONS:  1. Given the ongoing organic impairment with no additional response to      thiamine and the patient's age combined with the new development of  psychosis within the past 24 hours, would consult neurology to      pursue the possibility of less likely etiologies on the      differential.  The patient's antidepressant medication has not been      altered so far due to her history of recurrent severe depression;      however, given the fact that Wellbutrin is a dopamine reuptake      inhibitor, would now reduce it to 150 mg SR q.a.m.  While this is      not likely to be contributing to her mental status abnormalities,      this reduction is low risk and given the issues described above is      worth doing at this time.  2. Would also as discussed before continue to taper off the Klonopin      to as low as possible while maintaining anti-acute anxiety benefit.  3. Would continue the Depakote for anti-agitation and mood      stabilization.  4. Would start Zyprexa 5 mg q.h.s. for anti-psychosis.  5. Would continue the combination of Effexor combined with Wellbutrin      for anti-depression, which has been an effective maintenance      combination for this patient, however, will  reduce the Wellbutrin      as discussed above.  6. While continuing to reduce the Klonopin, would keep the Ativan      available p.r.n. as written.   Regarding disposition, this patient is still a candidate for a memory  impaired unit in an assisted-living facility; however, now that she has  developed a psychosis she would also be a candidate for an inpatient  psychiatric unit given that this psychosis can be reversed.   As mentioned, her decapacitating condition is the dementia not otherwise  specified.  Also she has a mild psychosis and is redirectable and  noncombative; therefore, she could be placed safely in a memory impaired  assisted living or skilled nursing facility.      Antonietta Breach, M.D.  Electronically Signed     JW/MEDQ  D:  12/13/2007  T:  12/13/2007  Job:  [id number]
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
## 2011-05-08 NOTE — Discharge Summary (Signed)
[name], [name]                ACCOUNT NO.:  [id number]   MEDICAL RECORD NO.:  [id number]          PATIENT TYPE:  INP   LOCATION:  1514                         FACILITY:  [facility]   PHYSICIAN:  Ladell Pier, M.D.   DATE OF BIRTH:  06/06/1946   DATE OF ADMISSION:  DATE OF DISCHARGE:                               DISCHARGE SUMMARY   ADDENDUM:  This is for the period between December the 5th and December  the 11th.  The outstanding problems are as follows:  1. Delirium:  Patient remains delirious.  Dr. Jeanie Sewer is following      patient and making adjustments to her medication.  We will continue      to monitor her.  2. Bipolar disorder:  Dr. Jeanie Sewer is also following her medication      regimen for that.  He continues to make adjustments.  3. Anemia:  Patient's hemoglobin remained stable at 11.  4. Abnormal liver function tests:  On transfer from the unit, patient      had abnormal liver function tests that have since resolved and her      liver function is back to normal.  5. Hyperglycemia:  She also has some elevated blood sugars, highest      1.78.  We will continue to monitor and cover with sliding scale      insulin.   DISPOSITION:  Patient will be discharged to an intermediate rehab  facility when she leaves the hospital, discussed this with her daughter.  She will continue PT.  We will continue to monitor her mental status.   DISCHARGE LABS:  Ammonia 25.  CMP, sodium 138, potassium 3.0, chloride  101, CO2 of 31, glucose 98, BUN 11, creatinine 0.62.  LFTs, as mentioned  before, within normal limits.  WBC 5.7, hemoglobin 11, platelets 403.  She has a chest x-ray that is pending.      Ladell Pier, M.D.  Electronically Signed     NJ/MEDQ  D:  12/04/2007  T:  12/05/2007  Job:  [id number]

## 2011-05-08 NOTE — Discharge Summary (Signed)
[name], [name]                ACCOUNT NO.:  [id number]   MEDICAL RECORD NO.:  [id number]          PATIENT TYPE:  INP   LOCATION:  1511                         FACILITY:  [facility]   PHYSICIAN:  Beckey Rutter, MD  DATE OF BIRTH:  November 11, 1946   DATE OF ADMISSION:  11/16/2007  DATE OF DISCHARGE:  12/15/2007                               DISCHARGE SUMMARY   Please refer to the previously dictated discharge summary for details in  regard to the hospitalization.   Today, the patient left the hospital and she was found by security, but  she refused to come up to the floor.  She has a friend who stated that  she is committed to keep her under observation 24/7.  The daughter  agreed to this plan and hence, the plan originally is to have the  patient continue on her medication of thiamine and Zyprexa.  Will  discharge her in assisted-living facility mainly for observation  purposes.  This plan is acceptable to use.  I also discussed this  situation with Dr. Antonietta Breach who wanted to make sure that the  patient will be released against medical advice with observation, the  thing which has happened as per the documentation on the chart in  several places.  So now, the patient is under observation 24/7 by her  friend.  The daughter and family are aware of the plan.  It was  recommended the patient continue Zyprexa and thiamine which was  discussed earlier with the family.  This discharge summary is to be  amended to the previously dictated discharge summary on December 14, 2007.   DISPOSITION:  The patient left the hospital against medical advice.      Beckey Rutter, MD  Electronically Signed     EME/MEDQ  D:  12/15/2007  T:  12/16/2007  Job:  [id number]

## 2011-05-08 NOTE — H&P (Signed)
NAMEJADA, FASS                ACCOUNT NO.:  [id number]   MEDICAL RECORD NO.:  [id number]          PATIENT TYPE:  INP   LOCATION:  1409                         FACILITY:  [facility]   PHYSICIAN:  Ladell Pier, M.D.   DATE OF BIRTH:  1946-06-29   DATE OF ADMISSION:  11/16/2007  DATE OF DISCHARGE:                              HISTORY & PHYSICAL   CHIEF COMPLAINT:  Shortness of breath.   HISTORY OF PRESENT ILLNESS:  The patient is a 65 year old white female  that presented to the emergency room complaining of dizziness when she  stands up, cough productive of green mucus as well as blood tinge and  increasing shortness of breath for the past 4 days.  She has subjective  fevers and chills.   PAST MEDICAL HISTORY:  Significant for:  1. Breast cancer with right upper extremity lymphedema.  2. GERD.  3. Gastritis.  4. Depression.  5. Hiatal hernia.  6. History of gallstones.  7. History of narcotic abuse, presently seeing a pain doctor, Dr.      Osborn Coho, phone number [phone].   FAMILY HISTORY:  Mother died at 29 from heart disease.  Father died at  41 from colon cancer.   SOCIAL HISTORY:  She smokes about a pack a day for many years she has  quit for about 12 days now.  No alcohol.  No IV drug use.  She is  divorced.  She has 1 child who is on disability.   MEDICATIONS:  1. Effexor 75 mg daily.  2. Prozac 40 mg daily.  3. Wellbutrin 300 mg daily.  4. Prevacid 20 mg daily.  5. Suboxone 8 mg to 10 mg daily.   ALLERGIES:  NO KNOWN DRUG ALLERGIES.   REVIEW OF SYSTEMS:  As per stated in HPI.   PHYSICAL EXAM:  VITAL SIGNS:  Temperature of 101.4, blood pressure  120/75, pulse 96, respirations 20, pulse oximetry 91% on room air.  HEENT:  Head is normocephalic, atraumatic.  Pupils are reactive to  light.  Throat without erythema.  CARDIOVASCULAR:  Regular rate and rhythm.  LUNGS:  Decreased breath sounds throughout.  ABDOMEN:  Positive bowel sounds.  Soft, nontender and  nondistended.  EXTREMITIES:  Without edema.  DP pulses 2+ bilaterally.   LABORATORY DATA:  WBC 8.7, hemoglobin 9.7, MCV 85.3, platelets 250,000.  Sodium 136, potassium 3.5, chloride 101, CO2 27, glucose 108, BUN 6,  creatinine 0.68, calcium 8.6.  UA showed 3-6 wbc's, 3-6 rbc's, moderate  amount of leukocyte esterase, moderate hemoglobin.   Chest x-ray showed diffuse density, edema versus pneumonia.   ASSESSMENT AND PLAN:  1. Pneumonia:  The patient most likely has atypical pneumonia, based      on other factor such as increase in temperature.  We will start her      on Rocephin and Zithromax.  We will also check a BNP level.  We      will give her nebulizer treatments, scheduled and as needed.  Blood      cultures x2.  2. Urinary tract infection.  The patient had a moderate amount  of      leukocyte esterase with 3-6 wbc's.  We will check a urine culture.      The patient is on Rocephin for pneumonia, which will also treat      urinary tract infection.  3. Gastroesophageal reflux disease.  Continue on a proton pump      inhibitor.  4. Depression.  Continue her on her home medications.  5. Question of treatment for narcotic abuse.  Will keep her on      Suboxone and verify with her pain doctor.  6. Anemia.  She has had a colonoscopy and upper endoscopy in the      computer it looks like back in 2005.  We will do an anemia panel to      see if she needs to be on iron replacement therapy.      Ladell Pier, M.D.  Electronically Signed     NJ/MEDQ  D:  11/16/2007  T:  11/17/2007  Job:  [id number]   cc:   Dr. Osborn Coho

## 2011-05-08 NOTE — Consult Note (Signed)
[name], [name]                ACCOUNT NO.:  [id number]   MEDICAL RECORD NO.:  [id number]          PATIENT TYPE:  INP   LOCATION:  1514                         FACILITY:  [facility]   PHYSICIAN:  Antonietta Breach, M.D.  DATE OF BIRTH:  Aug 27, 1946   DATE OF CONSULTATION:  12/01/2007  DATE OF DISCHARGE:                                 CONSULTATION   INPATIENT CONSULTATION FOLLOWUP   [name] continues to have stupor, clouding of consciousness,  disorganized thought process, difficulty concentrating.  She is not  having any current hallucinations or delusions.   WBC 5.8, hemoglobin 10.5, platelet count 479, SGOT 24, SGPT 29.   EXAMINATION:  Vital signs:  Temperature 97.4, pulse 87, respiratory rate  24, blood pressure 123/65, O2 saturation on 2 L 100%.   MENTAL STATUS EXAM:  As above, the patient, when asked what the year  was, she delayed and then finally said 2008.  She did have some coherent  thought process.  However, she is having stupor.  Her insight is poor.  Her judgment is impaired.  She does not appear to be having any  hallucinations or delusions.   RECOMMENDATION:  Would proceed with a Klonopin taper off or as low as  possible.  At this point we will decrease to 6 mg per day and utilize  Ativan 1-2 mg q.4h. p.r.n. anxiety/agitation.  Would also keep the  Haldol available p.r.n. for severe agitation or combativeness.   Given the fact that the patient has required Haldol 5 mg along with a 50  mcg fentanyl dose twice this morning, would increase her Depakote to 500  mg t.i.d.  This can provide antiagitation and mood stabilization with  less sedation.  Would also decrease the fentanyl as possible.   We may have to apply a standing Haldol dose.  However, since the patient  is not having hallucinations or delusions currently, will not proceed  with that yet.      Antonietta Breach, M.D.  Electronically Signed     JW/MEDQ  D:  12/05/2007  T:  12/06/2007  Job:   [id number]

## 2011-05-08 NOTE — Consult Note (Signed)
NAMEZIA, NAJERA                ACCOUNT NO.:  [id number]   MEDICAL RECORD NO.:  [id number]          PATIENT TYPE:  INP   LOCATION:  1514                         [facility]ACILITY:  [facility]   PHYSICIAN:  Antonietta Breach, M.D.  DATE OF BIRTH:  26-Mar-1946   DATE OF CONSULTATION:  DATE OF DISCHARGE:                                 CONSULTATION   INPATIENT CONSULTATION:  Ms. Pascarella is able to walk now with  assistance.  She continues to have memory impairment as well as  disorganized thought process.  She is not having hallucinations or  delusions.  However, she may have had some fluctuation into paranoia  this morning when she saw the attending physician, she clearly was  suspicious (there does still appear to be some waxing and waning).   She still has trouble with date orientation.  She is able to recognize  long-term friends and family.  Her mood is within normal limits  concerning depression.  She has constructive future goals and interests.  She still has decreased energy.  She is cooperative with health care and  redirectable.   Her ammonia level was 25, within normal limits.   PHYSICAL EXAMINATION:  VITAL SIGNS:  Temperature 98.3, pulse 76,  respiratory rate 18, blood pressure 123/73.  MENTAL STATUS EXAM:  As above.  The patient is alert.  Her attention  span is decreased.  She is mildly distractible.  Concentration decreased  on orientation testing; she states the year correctly, however, she does  not know the month or the day of the month.  She does not know the day  of the week.  She knows that she is in a hospital in [place].  She is  oriented to self.  On memory testing she can only recall 1/3 words  immediately.  Her fund of knowledge and intelligence are below that of  her premorbid baseline.  Her speech has a slightly flat prosody.  There  is no dysarthria.  Thought process, slight disorganization, no looseness  of associations.  Thought content, no thoughts of harming  herself, no  thoughts of harming others, no delusions, no hallucinations.  Insight is  poor, judgment is impaired.   ASSESSMENT:  AXIS I:  293.00 delirium, not otherwise specified; rule out  dementia, not otherwise specified.  Next diagnosis 296.80 bipolar  disorder, not otherwise specified, depressed.   The patient's cognitive and memory impairment may be due to slight  adverse effects of Depakote and Wellbutrin combined with narcotic  medications and Klonopin.  However, at this point one would expect a 60-  year-old to have regained cognitive and memory function.  Therefore  suspicion of other organic factors is warranted.   RECOMMENDATIONS:  1. Would check a cranial image.  Would also check B12, folic acid, RPR      and TSH.  2. Continue to taper off the Klonopin with the next step decreased to      1 mg b.i.d..  3. Will reduce Depakote to 1000 mg extended release p.o. daily at      bedtime.  4. Would decrease her opioid  pain medication to as low as possible.  5. If these measures do not improve her would decrease her Wellbutrin      to 150 mg XR p.o. q. a.m.   Concerning the patient's disposition she would require a memory impaired  assisted living unit until her mental status recovered.  Otherwise she  would require outpatient 24 hour supervision.  She does not have the  capacity for informed consent.      Antonietta Breach, M.D.  Electronically Signed     JW/MEDQ  D:  12/05/2007  T:  12/07/2007  Job:  [id number]

## 2011-05-08 NOTE — Consult Note (Signed)
NAMEGENTRI, [name]                ACCOUNT NO.:  [id number]   MEDICAL RECORD NO.:  [id number]          PATIENT TYPE:  INP   LOCATION:  1514                         FACILITY:  [facility]   PHYSICIAN:  Antonietta Breach, M.D.  DATE OF BIRTH:  [birth year]   DATE OF CONSULTATION:  12/03/2007  DATE OF DISCHARGE:  12/01/2007                                 CONSULTATION   [name] does continue with stupor, clouding of consciousness,  impairment of memory and disorientation.  She is not combative; however,  she did receive Haldol yesterday.   WBC 5.7, hemoglobin 11, platelet count 403, SGOT 23, SGPT 27, albumin  2.4.   Temperature 98.8, pulse 90, respiratory rate 22, blood pressure 125/75.   MENTAL STATUS EXAM:  As above, [name] is stuporous.  She has easy  distractibility,  impaired judgment, decreased concentration,  confabulation on thought process.  On thought content, there are no  hallucinations or delusions.  Judgment and insight are impaired.   ASSESSMENT:  AXIS I:  293.00, delirium not otherwise specified.   RECOMMENDATION:  Would discontinue the fentanyl p.r.n.  The undersigned  will write for that order.  We will check an ammonia level in that  Depakote can idiosyncratically increase ammonia independent of a  Depakote- induced hepatitis.   We will decrease her Klonopin with the goal of tapering off.  At this  point will decrease down to 1 mg t.i.d.      Antonietta Breach, M.D.  Electronically Signed     JW/MEDQ  D:  12/05/2007  T:  12/06/2007  Job:  [id number]

## 2011-05-08 NOTE — Group Therapy Note (Signed)
PURPOSE OF EVALUATION:  Evaluate and treat chronic diffuse pain.   HISTORY OF PRESENT ILLNESS:  [name] is a 65 year old adult female  referred to this office by Dr. Prince Rome for evaluation and treatment of  chronic diffuse pain.  Minimal medical records accompanied the patient,  and those were reviewed prior to the office visit.  She does bring in a  list of other complaints with her in the office today, and those were  reviewed as noted below.   It appears that the patient has had pain diffusely of her fingers,  hands, knees, shoulders and headaches described as cluster headaches  since approximately June 2008.  She apparently reports increased pain  gradually over the year 2008 and was prescribed oxycodone by Dr. Roselind Messier,  her radiation oncologist.  She has a history of breast cancer with  resection in December 2007 and has had increased pain since that time.   Apparently on June 05, 2007, the patient saw Dr. Prince Rome, and blood tests  including a sed rate were done along with rheumatoid factor and ANA.  The sed rate was elevated at 55, but the rheumatoid factor and  antinuclear antibodies were negative.  On June 10, 2007, the patient saw  Dr. Prince Rome in follow-up.  She complained of neck and upper back pain  after falling backwards out of a hammock.  He apparently did cervical  and thoracic spine and found no significant abnormalities and diagnosed  this with sprain of those two areas.   The patient apparently was given the oxycodone by Dr. Roselind Messier, her  radiation oncologist.  She is also followed by Dr. Donnie Coffin, her  oncologist.   The patient reports that she has taken oxycodone since December 2007  when she had breast surgery.  She reports that she was taking 5 mg 4  times a day for a total of 20 mg through August 2008, and then she  started increasing the medication on her own.  She initially increased  it to 6 tablets per day for a total of 30 mg per day and then increased  it on  her own to 40 mg daily for a total of 8 tablets per day.  She  reports that Dr. Prince Rome started her on OxyContin CR 10 mg twice a day  approximately a month ago and allowed her to use immediate-release  oxycodone 5 mg twice a day as needed.  On her own again, she misused the  medication and started using increased amounts of the OxyContin  medication with questionable increased amounts of oxycodone.  She  reports that Dr. Prince Rome has told her that they would not be able to  supply her with any more pain medicine secondary to her increased use,  and she was referred to this office for continued care.   Presently, the patient reports diffuse pain all over her body including  her hands, shoulders, midback, low back, bilateral hips, knees and feet  along with headache.  She reports the pain is interfering with all  activities including relaxation and enjoyment of life.  She reports the  pain is worse with inactivity and worse with some activities.  She  reports that the pain improves with medication, rest and therapy.   PAST MEDICAL HISTORY:  1. History of breast cancer with right partial mastectomy in December      2007 with subsequent radiation treatment.  2. History of right fourth trigger finger.  3. Tobacco abuse.  4. Cluster headaches.  5.  Panic attacks.  6. Bilateral hand degenerative joint disease primarily involving the      proximal interphalangeal joints.  7. Dyslipidemia.  8. Anemia.  9. Appendectomy in 1970s.  10.Bursitis of the shoulder.  11.Irritable bowel syndrome with colitis.  12.Fibromyalgia.  13.Gastritis.  14.Gallstones.  15.Sinus infection.  16.Osteopenia.  17.Pinched nerve of her back.  18.Severe insomnia.  19.Attention deficit disorder.  20.Hiatal hernia.  21.Panniculitis.  22.Depression and anxiety.  23.Iron deficiency anemia.  24.Lymph fluid of the right arm secondary to prior breast surgery.  25.Gingivitis.  26.Abscessed teeth.   ALLERGIES:  No known  drug allergies.   FAMILY HISTORY:  Positive for alcohol abuse, heart disease, drug abuse  and depression.   SOCIAL HISTORY:  The patient is divorced and has one grown daughter.  The patient does not use alcohol, although she does have a history of  alcohol abuse in the past along with numerous other medication and  nonprescription medication abuses including marijuana, Valium, Sinequan,  Fiorinal.  She still smokes 8 cigarettes per day.  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   MEDICATIONS:  1. Oxycodone immediate release 5 mg 1 tablet t.i.d. p.r.n.  2. OxyContin sustained release 10 mg b.i.d.  3. Prevacid 30 mg b.i.d.  4. Wellbutrin sustained release 150 mg b.i.d.  5. Prozac 40 mg daily.  6. Effexor XR 75 mg daily.  7. Alprazolam 0.25 mg b.i.d.  8. Cyclobenzaprine 10 mg t.i.d. p.r.n.  9. Nasacort 2 sprays daily p.r.n.   PHYSICAL EXAMINATION:  GENERAL:  Reasonably well-appearing, middle-aged  adult female who is pleasant and cooperative throughout the exam,  although she is a poor historian.  VITAL SIGNS:  Blood pressure is 118/67 with pulse of 92, respiratory  rate 18 and O2 saturation 97% on room air.  NEUROLOGIC/EXTREMITIES:  The patient ambulates without any assistive  device.  Bulk and tone were normal.  Upper extremity range of motion was  full and pain free.  Lumbar range of motion was good in all directions.  She complained of diffuse pain throughout her shoulders, upper arms,  hands, hips, knees and feet during the exam.  Upper extremity exam  showed 5-/5 strength.  Bulk and tone were normal, and reflexes were 2+  and symmetrical.  Cervical range of motion was decreased in all planes  with complaints of pain.  Lower extremity exam showed normal bulk and  tone with strength of at least 4+/5 in hip flexion/extension and ankle  dorsiflexion.  Straight leg raise was negative bilaterally.   Hip range  of motion was within functional limits with complaints of pain.   IMPRESSION:  1. Diffuse musculoskeletal pain, probable fibromyalgia.  2. History of breast cancer with prior partial mastectomy, December      2007.  3. Depression.  4. History of street drug, prescription drug and alcohol abuse.   In the office today, we had a long discussion regarding her abuse  tendencies in the past and the fact that she tends to misuse medication,  prescribed to her most recently by Dr. Prince Rome.  I have explicitly  explained to her that if she has any adjustment of her medicines on her  own without prior authorization that she will be released from this  office.  Furthermore, if she is calling in for medication early, she  will be released as that is a sign of overuse.  She seems  to have a  reasonable understanding, although I am not confident that she will be  able to comply with our rules.  At this point, we have given her a  prescription for oxycodone sustained release 10 mg b.i.d. and oxycodone  immediate release 5 mg 1 tablet b.i.d. p.r.n.  If she is able to comply  with her regulations, we certainly can treat her pain through this  office.  If she is unable or unwilling, either emotionally or  cognitively, to follow our directions, then we will have to release her.  This has been explained to her explicitly in the office today.   We did obtain a urine drug screen on the patient in the office today.  We will plan on seeing her in follow-up in approximately 4-5 weeks' time  with refills prior to that appointment as necessary.           ______________________________  Ellwood Dense, M.D.     DC/MedQ  D:  10/22/2007 11:43:50  T:  10/22/2007 15:47:22  Job #:  [id number]

## 2011-05-08 NOTE — Consult Note (Signed)
[name], [name]                ACCOUNT NO.:  [id number]   MEDICAL RECORD NO.:  [id number]          PATIENT TYPE:  INP   LOCATION:  1514                         FACILITY:  [facility]   PHYSICIAN:  Antonietta Breach, M.D.  DATE OF BIRTH:  1946-08-05   DATE OF CONSULTATION:  11/27/2007  DATE OF DISCHARGE:  12/01/2007                                 CONSULTATION   REQUESTING PHYSICIAN:  Dr. Sandrea Hughs   REASON FOR CONSULTATION:  Delirium, severe agitation.   HISTORY OF PRESENT ILLNESS:  [name] is a 65 year old female  admitted to the [facility] on November 16, 2007, with  pneumonia, anemia and dehydration.   The patient developed respiratory failure and had to go on the  ventilator.  She is currently extubated.  She has continued with  confusion, severe agitation, and has been crying out for her daughter  over and over again.  She goes in and out of sleep.  She has clouding of  consciousness and disorganized thought process.  This has been occurring  for approximately 1 week.  She has been on Seroquel 200 mg twice daily  without benefit.   PAST PSYCHIATRIC HISTORY:  The patent does have a history of depression.  She has a history of substance abuse counseling by [facility] locally.   She has a history of an admission to a psychiatric hospital.   The undersigned did not reveal any information to the family; however,  the family did report that the patient was doing well without any  substance-dependent behavior until she had breast surgery and then  started oxycodone.  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   FAMILY PSYCHIATRIC HISTORY:  None known.   SOCIAL HISTORY:  [name] does have a daughter who has been attentive  to her.  The patient also has an ex-husband who has been checking in on  her.  The patient is medically disabled.   PAST MEDICAL HISTORY:  The patient has pneumonia in the right middle  lobe, anemia.   MEDICATIONS:  The  MAR is reviewed.  She is on Klonopin 2 mg q.6h.,  Wellbutrin 150 mg b.i.d.  She is on clonidine 0.2 mg q.8h. for blood  pressure as well as opioid withdrawal symptoms.  She is on a fentanyl  patch 100 mcg per 72 hours.  She also is on Solu-Medrol 40 mg q.12h.,  Seroquel 200 mg b.i.d., Depakote 250 mg b.i.d.  She also is on Effexor  37.5 mg b.i.d.   No known drug allergies.   LABORATORY DATA:  Sodium 137, BUN 16, creatinine 0.58.  WBC 10.5,  hemoglobin 9.2, platelet count 423.  The magnesium and phosphorus are  within normal limits.  SGOT 36, SGPT 43.   REVIEW OF SYSTEMS:  Is limited to the due to the patient's poor history  providing ability.  CONSTITUTIONAL:  Afebrile.  No weight loss.  HEAD:  No trauma.  EYES:  No known visual changes.  EARS:  No known hearing  impairment.  NOSE:  No rhinorrhea.  MOUTH/THROAT:  No known sore throat.  NEUROLOGIC:  No focal motor or sensory changes.  PSYCHIATRIC:  As above.  CARDIOVASCULAR:  No chest pain.  RESPIRATORY:  No current cough.  GASTROINTESTINAL:  No vomiting.  GENITOURINARY:  No dysuria.  SKIN:  Unremarkable.  ENDOCRINE/METABOLIC:  No heat or cold intolerance.  MUSCULOSKELETAL:  No deformities.  HEMATOLOGIC/LYMPHATIC:  Anemia with  the hemoglobin as above.   EXAMINATION:  VITAL SIGNS:  Temperature 98.4, pulse 81, respiratory rate  is 18, blood pressure 132/68, O2 saturation on 3 L nasal cannula is 97%.  GENERAL APPEARANCE:  [name] is a middle-aged female lying in a  supine position with no abnormal involuntary movements.  OTHER MENTAL STATUS EXAM:  [name] has clouding of consciousness.  She has poor attention.  Her eye contact is intermittent, concentration  decreased, oriented to self only.  When she is asked where she is she  states on 51.  Memory function is poor.  She does appear to know the  name of her daughter as well as her own name.  Fund of knowledge and  intelligence are below that of her estimated premorbid baseline.   Her  affect is anxious, mood anxious, thought process disorganized.  She is  not able to perform calculation or abstraction testing.  Language  comprehension appears to be better than expression.  Expression as  above.  Thought content:  The patient is not able to provide questions  of thought content.  Currently there appears to be internal stimulation.  Insight is poor, judgment is impaired.   ASSESSMENT:  AXIS I:  1. Code 293.00 delirium not otherwise specified.  2. Code 293.83 mood disorder not otherwise specified.  She appears      have a history of depression.  However, there are also general      medical factors.  The delirium at this point does appear to be a      combination of a baseline anemia with an overlay of acute infection      factors and possibly a role of the steroid treatment.  AXIS II:  Deferred.  AXIS III:  See general medical section above.  AXIS IV:  General medical, primary support group.  AXIS V:  20.   RECOMMENDATION:  1. Would discontinue the Seroquel and would start Haldol standing 1 mg      t.i.d.  The Seroquel does have a significant amount of      antihistaminic properties that may be contributing to the patient's      clouding of consciousness.  Haldol is much more of a pure D2      blocker.  2. Would increase the patient's Depakote to 250 mg b.i.d. and 500 mg      q.h.s. for anti-acute agitation and mood stabilization.  3. Would check a QTC twice daily and discontinue the Haldol if the QTC      is greater than 500 milliseconds.  4. Will not change other psychotropic medications at this time but      will follow up.  5. Keep memory and orientation cues in the room with low-stimulation      ego support.      Antonietta Breach, M.D.  Electronically Signed     JW/MEDQ  D:  12/03/2007  T:  12/03/2007  Job:  [id number]
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
## 2011-05-11 NOTE — Op Note (Signed)
[name], [name]                            ACCOUNT NO.:  [id number]   MEDICAL RECORD NO.:  [id number]                   PATIENT TYPE:  AMB   LOCATION:  DAY                                  FACILITY:  [facility]   PHYSICIAN:  Laqueta Linden, M.D.                 DATE OF BIRTH:  Apr 29, 1946   DATE OF PROCEDURE:  07/27/2004  DATE OF DISCHARGE:                                 OPERATIVE REPORT   PREOPERATIVE DIAGNOSIS:  Postmenopausal bleeding due to endometrial polyp.   POSTOPERATIVE DIAGNOSIS:  Postmenopausal bleeding due to endometrial polyp.   PROCEDURE:  Hysteroscopic resection with curettage.   SURGEON:  Laqueta Linden, M.D.   ANESTHESIA:  General LMA.   ESTIMATED BLOOD LOSS:  Less than 100 mL.   SORBITOL NET INTAKE:  Less than 100 mL.   SPECIMENS:  1. Polyp.  2. Curetting sent to pathology.   COMPLICATIONS:  None.   INDICATIONS:  [name] is a 65 year old, menopausal female on combined  hormonal replacement therapy, who started bleeding several weeks ago.  She  had an increase in her progesterone and actually stopped her estrogen and  continued to have heavy bleeding, dropping her hemoglobin as much as 3 g.  She was evaluated in the office with an endometrial biopsy which revealed  some simple hyperplasia and the question of a polyp.  Her Prometrium was  increased, and she therefore underwent a sonohysterogram which revealed a  slightly enlarged retroverted uterus with a small intramural fibroid.  Ovaries were normal.  There was a 1.5 x 0.6 cm fundal endometrial polyp  noted at that time.  The patient continued to have bleeding in spite of  resuming her estrogen at twice a day for symptoms control and increasing her  Prometrium to 100 mg daily.  She is therefore to undergo hysteroscopic  resection with curettage in an effort to stop the bleeding.  She has seen  the informed consent film, voiced her understanding of symptoms as well as  risks, benefits, alternatives, and  complications and agrees to proceed.   DESCRIPTION OF PROCEDURE:  The patient was taken to the operating room and  after proper identification and consents were ascertained, she was placed on  the operating room table in supine position.  After general LMA was  introduced, she was placed in the [place] stirrups, and the perineum and  vagina were prepped and draped in routine sterile fashion.  Uterus was noted  to be retroverted and somewhat irregular.  The speculum was placed into the  vagina and the internal os dilated to a #33 Pratt dilator.  Marked  difficulty was encountered inserting the resectoscope due to placement of  the loop which appeared to protrude from the end of the scope. This was  eventually rectified.  The scope was still difficult to insert to the fundus  due to the tortuosity of the endocervical canal  and the fact that this  uterus was acutely retroflexed.  Eventually the fundus was identified, and  the fundal polyp was noted.  It was resected and sent as a separate specimen  to pathology.  The remainder of the endometrial cavity appeared quite  shaggy, and sharp curettage productive of a moderate amount of additional  tissue was performed.  Aggressive curettage was performed in an effort to  cause __________ and hopefully at least temporarily stop the bleeding.  This  was sent as a separate specimen.  The patient tolerated the procedure well.  Estimated blood loss less than 100 mL.  Sorbitol intake less than 100 mL.  Complications none.  The patient was awakened stable and transferred to the  PACU.  She was given Toradol 30 mg IV and 30 mg IM intraoperatively.  She  will be given routine verbal and written discharge instructions and told to  follow up in the office in 2 weeks.  She is to take Advil orally as needed  for cramping, to continue her Estradiol once daily and to continue her  Prometrium 100 mg twice daily at this time.  She will be seen in the office  next  week for a hemoglobin check and knows to expect some bleeding over the  next week or so due to the procedure.  The patient was stable upon discharge  per anesthesia.                                               Laqueta Linden, M.D.    LKS/MEDQ  D:  07/27/2004  T:  07/27/2004  Job:  [id number]

## 2011-05-11 NOTE — Procedures (Signed)
[place]  Patient:    GENEIEVE, DUELL Visit Number: [id number] MRN: [id number]          Service Type: END Location: ENDO Attending Physician:  Nelda Marseille Dictated by:   Petra Kuba, M.D. Proc. Date: 10/21/01 Admit Date:  10/21/2001   CC:         Duncan Dull, M.D.  Beulah Gandy. Royetta Asal, M.D.   Procedure Report  PROCEDURE PERFORMED:  Esophagogastroduodenoscopy.  ENDOSCOPIST:  Petra Kuba, M.D.  INDICATIONS FOR PROCEDURE:  Upper tract symptoms.  Consent was signed prior to any premeds given after risks, benefits, methods, and options were thoroughly discussed in the office.  ADDITIONAL MEDICINES FOR THIS PROCEDURE:  Demerol 20 mg, Versed 2 mg.  DESCRIPTION OF PROCEDURE:  The video endoscope was inserted by direct vision. Esophagus was normal.  In the distal esophagus was a small medium-sized hiatal hernia.  There were no signs of esophagitis or Barretts.  The scope passed into the stomach and into the antrum, pertinent for some mild linear antritis, through a normal pylorus into a normal duodenal bulb and around the C-loop to a normal second portion of the duodenum.  Scope was withdrawn back to the bulb and a good look there ruled out ulcers in that location.  Scope was withdrawn back to the stomach and retroflexed.  She had some difficulty holding air but the angularis, cardia and fundus were all normal.  The lesser and greater curve were normal on retroflex and then straight visualization.  The scope was then slowly withdrawn.  Again a good look at the esophagus from the hiatal hernia ruled out any other abnormality.  The scope was removed.  The patient tolerated the procedure well.  There was no obvious immediate complication.  ENDOSCOPIC DIAGNOSIS: 1. Small hiatal hernia. 2. Mild linear antritis. 3. Otherwise normal esophagogastroduodenoscopy.  PLAN:  Happy to see back p.r.n.  Continue present management further  work-up and plans per Dr. Kevan Ny.  Consider completing her GI work-up to include probably CAT scan next with an ultrasound of her gallbladder and possibly even a one time small bowel follow-through to be complete and I will be happy to assist if needed. Dictated by:   Petra Kuba, M.D. Attending Physician:  Nelda Marseille DD:  10/21/01 TD:  10/22/01 Job: [id number]

## 2011-05-11 NOTE — Op Note (Signed)
NAMESAMANTHIA, HOWLAND                ACCOUNT NO.:  [id number]   MEDICAL RECORD NO.:  [id number]          PATIENT TYPE:  AMB   LOCATION:  DSC                          FACILITY:  [facility]   PHYSICIAN:  Rose Phi. Maple Hudson, M.D.   DATE OF BIRTH:  September 11, 1946   DATE OF PROCEDURE:  12/11/2006  DATE OF DISCHARGE:                               OPERATIVE REPORT   PREOPERATIVE DIAGNOSIS:  Stage I carcinoma of the right breast.   POSTOPERATIVE DIAGNOSIS:  Stage I carcinoma of the right breast.   OPERATION:  1. Blue dye injection.  2. Right partial mastectomy.  3. Right sentinel lymph node biopsy.   SURGEON:  Rose Phi. Maple Hudson, M.D.   ANESTHESIA:  General.   OPERATIVE PROCEDURE:  Prior to coming to the operating room, 1 mCi of  technetium sulfur colloid was injected intradermally in the periareolar  area of the right breast.  After suitable general anesthesia was  induced, the patient was placed in the supine position with the arms  extended on the arm board.  5 mL of a mixture of 2 mL of methylene blue  and 3 mL of injectable saline was injected in the subareolar tissue and  the breast gently massaged for three minutes.  We then prepped and  draped the breast and axilla.   A short transverse right axillary incision was made with dissection  through the subcutaneous tissue to the clavipectoral fascia.  Deep to  the fascia were two blue and hot lymph nodes which were removed as  sentinel lymph nodes.  There were no other palpable blue or hot nodes.  The tumor was at the 9 o'clock position of the right breast and a radial  incision was outlined overlying it and an incision made and then a wide  excision of the palpable tumor and the surrounding tissue was carried  out.  The specimen was oriented for the pathologist and submitted for  evaluation of margins.   The sentinel node was reported as negative for metastatic disease.  The  margins were reported as clear, although the anterior margin had a  few  atypical cells.   Both incisions were closed in two layers of 3-0 Vicryl and subcuticular  4-0 Monocryl and Dermabond.  Dressings were applied.  The patient was  transferred to the recovery room in satisfactory condition having  tolerated the procedure well.     Rose Phi. Maple Hudson, M.D.  Electronically Signed    PRY/MEDQ  D:  12/11/2006  T:  12/11/2006  Job:  [id number]

## 2011-05-11 NOTE — Procedures (Signed)
[place]  Patient:    NATORI, GUDINO Visit Number: [id number] MRN: [id number]          Service Type: END Location: ENDO Attending Physician:  Nelda Marseille Dictated by:   Petra Kuba, M.D. Proc. Date: 10/21/01 Admit Date:  10/21/2001   CC:         Duncan Dull, M.D.             Lemmie Evens, M.D.                           Procedure Report  PROCEDURE:  Colonoscopy.  INDICATION:  Patient with multiple GI complaints, family history of colon cancer, overdue for colonic screening.  Consent was signed after risks, benefits, methods, and options thoroughly discussed in the office.  MEDICATIONS:  Demerol 130 mg, Versed 10 mg.  DESCRIPTION OF PROCEDURE:  Rectal inspection was pertinent for external hemorrhoids, small.  Digital exam was negative.  Pediatric video colonoscope was inserted and fairly easily advanced around the colon to the cecum.  No obvious abnormality was seen on insertion.  To advance to the cecum required rolling her on her back and various abdominal pressures.  The cecum was identified by the appendiceal orifice and the ileocecal valve.  In fact, the scope was inserted a short way into the terminal ileum, which was normal. Photo documentation was obtained.  The scope was slowly withdrawn.  The prep was fair at best.  We suctioned over 2 L of liquid stool but still had some formed stool that we could not wash and suction; however, on slow withdrawal back to the rectum no polypoid lesions, masses, diverticula, or other abnormalities were seen.  Once back in the rectum the scope was then retroflexed, pertinent for some internal hemorrhoids.  The scope was straightened and readvanced a short way up the left side of the colon, air was suctioned, scope removed.  The patient tolerated the procedure well.  There was no obvious immediate complication.  ENDOSCOPIC DIAGNOSES: 1. Internal-external hemorrhoids, small. 2.  Fair prep, lots of washing and suctioning done. 3. Otherwise within normal limits to the terminal ileum, but small lesions    could have been missed.  PLAN:  Yearly rectals and guaiacs per Dr. Kevan Ny.  Continue workup with an EGD. Re-screening p.r.n. or in five years and probably use a more aggressive prep and possibly even consider diprivan at that junction. Dictated by:   Petra Kuba, M.D. Attending Physician:  Nelda Marseille DD:  10/21/01 TD:  10/22/01 Job: [id number]

## 2011-05-25 ENCOUNTER — Encounter (HOSPITAL_BASED_OUTPATIENT_CLINIC_OR_DEPARTMENT_OTHER): Payer: Medicare Other | Admitting: Oncology

## 2011-05-25 DIAGNOSIS — Z170 Estrogen receptor positive status [ER+]: Secondary | ICD-10-CM

## 2011-05-25 DIAGNOSIS — C50919 Malignant neoplasm of unspecified site of unspecified female breast: Secondary | ICD-10-CM

## 2011-05-25 DIAGNOSIS — F329 Major depressive disorder, single episode, unspecified: Secondary | ICD-10-CM

## 2011-06-05 ENCOUNTER — Encounter (HOSPITAL_BASED_OUTPATIENT_CLINIC_OR_DEPARTMENT_OTHER): Payer: Medicare Other | Admitting: Oncology

## 2011-06-05 ENCOUNTER — Other Ambulatory Visit: Payer: Self-pay | Admitting: Oncology

## 2011-06-05 DIAGNOSIS — Z170 Estrogen receptor positive status [ER+]: Secondary | ICD-10-CM

## 2011-06-05 DIAGNOSIS — F329 Major depressive disorder, single episode, unspecified: Secondary | ICD-10-CM

## 2011-06-05 DIAGNOSIS — C50419 Malignant neoplasm of upper-outer quadrant of unspecified female breast: Secondary | ICD-10-CM

## 2011-06-05 LAB — COMPREHENSIVE METABOLIC PANEL
AST: 16 U/L (ref 0–37)
Albumin: 4.4 g/dL (ref 3.5–5.2)
Alkaline Phosphatase: 52 U/L (ref 39–117)
BUN: 15 mg/dL (ref 6–23)
Calcium: 9.5 mg/dL (ref 8.4–10.5)
Chloride: 101 mEq/L (ref 96–112)
Glucose, Bld: 114 mg/dL — ABNORMAL HIGH (ref 70–99)
Potassium: 4 mEq/L (ref 3.5–5.3)
Sodium: 138 mEq/L (ref 135–145)
Total Protein: 6.7 g/dL (ref 6.0–8.3)

## 2011-06-05 LAB — VITAMIN D 25 HYDROXY (VIT D DEFICIENCY, FRACTURES): Vit D, 25-Hydroxy: 36 ng/mL (ref 30–89)

## 2011-06-05 LAB — CBC WITH DIFFERENTIAL/PLATELET
Basophils Absolute: 0 10*3/uL (ref 0.0–0.1)
Eosinophils Absolute: 0.1 10*3/uL (ref 0.0–0.5)
HGB: 12.7 g/dL (ref 11.6–15.9)
MONO%: 7.3 % (ref 0.0–14.0)
NEUT#: 2.7 10*3/uL (ref 1.5–6.5)
RBC: 4.04 10*6/uL (ref 3.70–5.45)
RDW: 13.3 % (ref 11.2–14.5)
WBC: 5 10*3/uL (ref 3.9–10.3)
lymph#: 1.8 10*3/uL (ref 0.9–3.3)

## 2011-06-07 ENCOUNTER — Other Ambulatory Visit: Payer: Self-pay | Admitting: Oncology

## 2011-06-11 LAB — UIFE/LIGHT CHAINS/TP QN, 24-HR UR
Albumin, U: DETECTED
Alpha 2, Urine: DETECTED — AB
Beta, Urine: DETECTED — AB
Gamma Globulin, Urine: DETECTED — AB
Total Protein, Urine-Ur/day: 11 mg/d (ref 10–140)
Volume, Urine: 600 mL

## 2011-06-12 ENCOUNTER — Encounter: Payer: Medicare Other | Admitting: Oncology

## 2011-09-28 LAB — URINALYSIS, ROUTINE W REFLEX MICROSCOPIC
Nitrite: NEGATIVE
Protein, ur: 30 — AB
Specific Gravity, Urine: 1.035 — ABNORMAL HIGH
Urobilinogen, UA: 1

## 2011-09-28 LAB — COMPREHENSIVE METABOLIC PANEL
Albumin: 3.7
Alkaline Phosphatase: 72
BUN: 7
CO2: 27
Chloride: 102
Creatinine, Ser: 0.8
GFR calc non Af Amer: >60
Potassium: 4.9
Total Bilirubin: 1.2

## 2011-09-28 LAB — URINE MICROSCOPIC-ADD ON

## 2011-10-01 LAB — CBC
HCT: 29.1 — ABNORMAL LOW
HCT: 30.6 — ABNORMAL LOW
HCT: 30.9 — ABNORMAL LOW
HCT: 32.1 — ABNORMAL LOW
HCT: 32.8 — ABNORMAL LOW
Hemoglobin: 10 — ABNORMAL LOW
Hemoglobin: 10.5 — ABNORMAL LOW
Hemoglobin: 10.7 — ABNORMAL LOW
Hemoglobin: 11 — ABNORMAL LOW
Hemoglobin: 9.1 — ABNORMAL LOW
Hemoglobin: 9.9 — ABNORMAL LOW
MCHC: 32.7
MCHC: 33.2
MCHC: 33.4
MCHC: 33.4
MCHC: 33.6
MCHC: 33.9
MCV: 85.5
MCV: 86.1
MCV: 86.5
MCV: 86.6
MCV: 86.6
Platelets: 332
Platelets: 370
Platelets: 403 — ABNORMAL HIGH
Platelets: 423 — ABNORMAL HIGH
Platelets: 469 — ABNORMAL HIGH
Platelets: 500 — ABNORMAL HIGH
RBC: 3.18 — ABNORMAL LOW
RBC: 3.36 — ABNORMAL LOW
RBC: 3.5 — ABNORMAL LOW
RBC: 3.63 — ABNORMAL LOW
RDW: 18.4 — ABNORMAL HIGH
RDW: 18.5 — ABNORMAL HIGH
RDW: 18.5 — ABNORMAL HIGH
RDW: 18.7 — ABNORMAL HIGH
RDW: 19 — ABNORMAL HIGH
RDW: 19 — ABNORMAL HIGH
RDW: 19.5 — ABNORMAL HIGH
RDW: 20.1 — ABNORMAL HIGH
WBC: 10.5
WBC: 5.8
WBC: 8.6

## 2011-10-01 LAB — COMPREHENSIVE METABOLIC PANEL
ALT: 29
ALT: 29
ALT: 43 — ABNORMAL HIGH
AST: 28
AST: 36
Albumin: 2.3 — ABNORMAL LOW
Albumin: 2.3 — ABNORMAL LOW
Albumin: 2.4 — ABNORMAL LOW
Alkaline Phosphatase: 103
Alkaline Phosphatase: 123 — ABNORMAL HIGH
BUN: 10
BUN: 11
BUN: 12
CO2: 31
Calcium: 8.5
Calcium: 8.5
Calcium: 8.6
Calcium: 8.8
Chloride: 98
Creatinine, Ser: 0.62
Creatinine, Ser: 0.62
Creatinine, Ser: 0.65
GFR calc Af Amer: >60
GFR calc Af Amer: >60
GFR calc non Af Amer: >60
Glucose, Bld: 116 — ABNORMAL HIGH
Glucose, Bld: 90
Glucose, Bld: 98
Potassium: 3.7
Potassium: 3.8
Sodium: 135
Sodium: 137
Sodium: 137
Total Bilirubin: 0.6
Total Protein: 5.6 — ABNORMAL LOW
Total Protein: 5.8 — ABNORMAL LOW
Total Protein: 5.9 — ABNORMAL LOW

## 2011-10-01 LAB — BLOOD GAS, ARTERIAL
Acid-Base Excess: 3.2 — ABNORMAL HIGH
Acid-Base Excess: 7.8 — ABNORMAL HIGH
Bicarbonate: 30.7 — ABNORMAL HIGH
Drawn by: 232811
Drawn by: 275531
Drawn by: 275531
Drawn by: 295541
FIO2: 0.3
MECHVT: 420
MECHVT: 420
MECHVT: 420
O2 Saturation: 93.7
O2 Saturation: 96.6
PEEP: 5
PEEP: 5
PEEP: 5
Patient temperature: 98
Patient temperature: 98
Patient temperature: 98.6
RATE: 18
RATE: 18
pCO2 arterial: 64.1
pH, Arterial: 7.418 — ABNORMAL HIGH
pO2, Arterial: 68 — ABNORMAL LOW

## 2011-10-01 LAB — BASIC METABOLIC PANEL
BUN: 12
BUN: 16
BUN: 16
CO2: 35 — ABNORMAL HIGH
Calcium: 8.1 — ABNORMAL LOW
Calcium: 8.7
Calcium: 8.7
Chloride: 93 — ABNORMAL LOW
Chloride: 93 — ABNORMAL LOW
Creatinine, Ser: 0.58
GFR calc non Af Amer: >60
GFR calc non Af Amer: >60
Glucose, Bld: 112 — ABNORMAL HIGH
Glucose, Bld: 125 — ABNORMAL HIGH
Glucose, Bld: 130 — ABNORMAL HIGH
Glucose, Bld: 78
Potassium: 3.8
Sodium: 136

## 2011-10-01 LAB — HEPATIC FUNCTION PANEL
ALT: 51 — ABNORMAL HIGH
AST: 41 — ABNORMAL HIGH
Albumin: 2 — ABNORMAL LOW
Alkaline Phosphatase: 153 — ABNORMAL HIGH
Bilirubin, Direct: <0.1
Total Bilirubin: 0.4
Total Bilirubin: 0.6
Total Protein: 5.4 — ABNORMAL LOW

## 2011-10-01 LAB — AMMONIA: Ammonia: 25

## 2011-10-01 LAB — MAGNESIUM: Magnesium: 2.1

## 2011-10-01 LAB — GLUCOSE, RANDOM: Glucose, Bld: 157 — ABNORMAL HIGH

## 2011-10-01 LAB — CARDIAC PANEL(CRET KIN+CKTOT+MB+TROPI)
CK, MB: 2
Total CK: 153
Troponin I: 0.05

## 2011-10-01 LAB — TSH: TSH: 3.591

## 2011-10-01 LAB — FOLATE: Folate: >20

## 2011-10-01 LAB — PHOSPHORUS: Phosphorus: 4.1

## 2011-10-01 LAB — VITAMIN B12: Vitamin B-12: 726 (ref 211–911)

## 2011-10-01 LAB — CLOSTRIDIUM DIFFICILE EIA

## 2011-10-02 LAB — COMPREHENSIVE METABOLIC PANEL
ALT: 27
ALT: 64 — ABNORMAL HIGH
Albumin: 1.6 — ABNORMAL LOW
Alkaline Phosphatase: 104
Alkaline Phosphatase: 384 — ABNORMAL HIGH
BUN: 15
BUN: 15
BUN: 8
CO2: 32
Calcium: 7.6 — ABNORMAL LOW
Calcium: 7.9 — ABNORMAL LOW
Chloride: 102
Creatinine, Ser: 0.66
GFR calc non Af Amer: >60
Glucose, Bld: 110 — ABNORMAL HIGH
Glucose, Bld: 159 — ABNORMAL HIGH
Potassium: 3.2 — ABNORMAL LOW
Potassium: 3.5
Potassium: 3.5
Total Bilirubin: 0.9
Total Protein: 5.1 — ABNORMAL LOW
Total Protein: 5.3 — ABNORMAL LOW

## 2011-10-02 LAB — CROSSMATCH
ABO/RH(D): A POS
Antibody Screen: NEGATIVE

## 2011-10-02 LAB — BLOOD GAS, ARTERIAL
Acid-Base Excess: 7.2 — ABNORMAL HIGH
Acid-Base Excess: 7.2 — ABNORMAL HIGH
Acid-base deficit: 0.3
Acid-base deficit: 2.5 — ABNORMAL HIGH
Acid-base deficit: 2.7 — ABNORMAL HIGH
Bicarbonate: 22.1
Bicarbonate: 32.9 — ABNORMAL HIGH
Bicarbonate: 33.5 — ABNORMAL HIGH
Drawn by: 232811
Drawn by: 276051
Drawn by: 276051
FIO2: 0.3
FIO2: 0.3
FIO2: 0.5
FIO2: 0.6
FIO2: 0.6
MECHVT: 340
MECHVT: 400
MECHVT: 420
MECHVT: 420
MECHVT: 450
O2 Saturation: 93.4
O2 Saturation: 93.6
O2 Saturation: 97.5
PEEP: 5
PEEP: 5
PEEP: 5
PEEP: 5
Patient temperature: 100.3
Patient temperature: 98
Patient temperature: 98.1
Patient temperature: 98.6
Patient temperature: 98.6
Patient temperature: 98.6
Pressure support: 5
RATE: 18
RATE: 26
TCO2: 20.4
TCO2: 20.5
TCO2: 21
TCO2: 30.8
TCO2: 31.3
pCO2 arterial: 40
pCO2 arterial: 42.9
pCO2 arterial: 55.7 — ABNORMAL HIGH
pCO2 arterial: 55.7 — ABNORMAL HIGH
pH, Arterial: 7.331 — ABNORMAL LOW
pH, Arterial: 7.366
pH, Arterial: 7.389
pH, Arterial: 7.395
pO2, Arterial: 67.3 — ABNORMAL LOW
pO2, Arterial: 67.9 — ABNORMAL LOW
pO2, Arterial: 82.7

## 2011-10-02 LAB — BASIC METABOLIC PANEL
BUN: 11
BUN: 12
BUN: 12
BUN: 16
BUN: 6
CO2: 27
CO2: 31
CO2: 34 — ABNORMAL HIGH
Calcium: 8.3 — ABNORMAL LOW
Chloride: 101
Chloride: 105
Chloride: 94 — ABNORMAL LOW
Chloride: 99
Chloride: 99
Creatinine, Ser: 0.59
Creatinine, Ser: 0.63
Creatinine, Ser: 0.75
GFR calc Af Amer: >60
GFR calc Af Amer: >60
GFR calc Af Amer: >60
GFR calc non Af Amer: >60
GFR calc non Af Amer: >60
Glucose, Bld: 199 — ABNORMAL HIGH
Glucose, Bld: 229 — ABNORMAL HIGH
Potassium: 3.4 — ABNORMAL LOW
Potassium: 3.5
Potassium: 3.9
Potassium: 4.1
Sodium: 136
Sodium: 137

## 2011-10-02 LAB — CBC
HCT: 22.6 — ABNORMAL LOW
HCT: 26.4 — ABNORMAL LOW
HCT: 27.8 — ABNORMAL LOW
HCT: 28 — ABNORMAL LOW
HCT: 30.3 — ABNORMAL LOW
Hemoglobin: 7.8 — CL
Hemoglobin: 8.3 — ABNORMAL LOW
Hemoglobin: 9.1 — ABNORMAL LOW
Hemoglobin: 9.4 — ABNORMAL LOW
MCHC: 33.3
MCHC: 33.6
MCHC: 33.9
MCV: 85.3
MCV: 85.3
MCV: 85.7
MCV: 86
Platelets: 250
Platelets: 304
Platelets: 318
Platelets: 344
RBC: 2.69 — ABNORMAL LOW
RBC: 2.81 — ABNORMAL LOW
RBC: 2.87 — ABNORMAL LOW
RBC: 3.14 — ABNORMAL LOW
RBC: 3.25 — ABNORMAL LOW
RBC: 3.28 — ABNORMAL LOW
RBC: 3.55 — ABNORMAL LOW
RDW: 19.2 — ABNORMAL HIGH
RDW: 22 — ABNORMAL HIGH
WBC: 10.4
WBC: 8.7
WBC: 8.8
WBC: 9
WBC: 9.5

## 2011-10-02 LAB — URINE CULTURE: Culture: NO GROWTH

## 2011-10-02 LAB — LEGIONELLA ANTIGEN, URINE: Legionella Antigen, Urine: NEGATIVE

## 2011-10-02 LAB — LACTIC ACID, PLASMA: Lactic Acid, Venous: 1.5

## 2011-10-02 LAB — BENZODIAZEPINE, QUANTITATIVE, URINE: Nordiazepam GC/MS Conf: NEGATIVE

## 2011-10-02 LAB — MAGNESIUM
Magnesium: 2.1
Magnesium: 2.2

## 2011-10-02 LAB — CULTURE, BLOOD (ROUTINE X 2)
Culture: NO GROWTH
Culture: NO GROWTH
Culture: NO GROWTH
Culture: NO GROWTH
Culture: NO GROWTH

## 2011-10-02 LAB — URINALYSIS, ROUTINE W REFLEX MICROSCOPIC
Glucose, UA: NEGATIVE
Nitrite: NEGATIVE
Protein, ur: 30 — AB
Specific Gravity, Urine: 1.036 — ABNORMAL HIGH
Urobilinogen, UA: 1
pH: 5.5

## 2011-10-02 LAB — DRUGS OF ABUSE SCREEN W/O ALC, ROUTINE URINE
Cocaine Metabolites: NEGATIVE
Creatinine,U: 186.2
Opiate Screen, Urine: POSITIVE — AB
Phencyclidine (PCP): NEGATIVE
Propoxyphene: NEGATIVE

## 2011-10-02 LAB — CULTURE, BAL-QUANTITATIVE W GRAM STAIN: Colony Count: NO GROWTH

## 2011-10-02 LAB — OPIATE, QUANTITATIVE, URINE
Codeine Urine: NEGATIVE ng/mL
Hydrocodone: NEGATIVE ng/mL
Hydromorphone GC/MS Conf: NEGATIVE ng/mL
Morphine, Confirm: 2590 ng/mL
Oxycodone, ur: NEGATIVE ng/mL

## 2011-10-02 LAB — VANCOMYCIN, TROUGH: Vancomycin Tr: 8.9

## 2011-10-02 LAB — CARDIAC PANEL(CRET KIN+CKTOT+MB+TROPI)
CK, MB: 19.5 — ABNORMAL HIGH
Total CK: 234 — ABNORMAL HIGH

## 2011-10-02 LAB — CULTURE, RESPIRATORY W GRAM STAIN

## 2011-10-02 LAB — IRON AND TIBC
Iron: 11 — ABNORMAL LOW
TIBC: 154 — ABNORMAL LOW

## 2011-10-02 LAB — B-NATRIURETIC PEPTIDE (CONVERTED LAB)
Pro B Natriuretic peptide (BNP): 59.9
Pro B Natriuretic peptide (BNP): 66
Pro B Natriuretic peptide (BNP): 88.4

## 2011-10-02 LAB — URINE MICROSCOPIC-ADD ON

## 2011-10-02 LAB — DIFFERENTIAL
Eosinophils Absolute: 0.2
Eosinophils Relative: 3
Lymphs Abs: 0.8
Monocytes Relative: 11

## 2011-10-02 LAB — RETICULOCYTES
RBC.: 3.31 — ABNORMAL LOW
Retic Count, Absolute: 56.3

## 2011-10-02 LAB — STREP PNEUMONIAE URINARY ANTIGEN: Strep Pneumo Urinary Antigen: NEGATIVE

## 2011-10-02 LAB — FOLATE: Folate: 10.6

## 2011-10-02 LAB — LIPASE, BLOOD: Lipase: 14

## 2011-10-02 LAB — SEDIMENTATION RATE: Sed Rate: >140 — ABNORMAL HIGH

## 2011-10-02 LAB — AMYLASE: Amylase: 60

## 2011-11-27 ENCOUNTER — Encounter: Payer: Medicare Other | Attending: Family Medicine | Admitting: *Deleted

## 2011-11-27 ENCOUNTER — Encounter: Payer: Self-pay | Admitting: *Deleted

## 2011-11-27 DIAGNOSIS — Z713 Dietary counseling and surveillance: Secondary | ICD-10-CM | POA: Insufficient documentation

## 2011-11-27 DIAGNOSIS — R7309 Other abnormal glucose: Secondary | ICD-10-CM | POA: Insufficient documentation

## 2011-11-27 NOTE — Patient Instructions (Signed)
Pt instructions:  Patient plans to aim for 3 - 4 Carb choices per meal, 2 per snack Read food labels for Total Carbohydrate of foods Continue with current activity level of managing her home and yard work, and walking dog daily

## 2011-11-27 NOTE — Progress Notes (Signed)
  Medical Nutrition Therapy:  Appt start time: 1100 end time:  1200.   Assessment:  Primary concerns today: Patient states she would like more information about pre-diabetes in relation to her current eating habits. She does enjoy sweets, eats 3 meals and 3+ snacks per day and is active around her home. 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 She states she has been active in [place] since 1989. She also states she gets together with friends a few times per week and walks her dog daily  MEDICATIONS: see list   DIETARY INTAKE:  Usual eating pattern includes 3 meals and 3+ snacks per day.  Everyday foods include fair variety of most food groups.  Avoided foods include coffee, hard candy limited vegetables.    24-hr recall:  B ( AM): scrambled egg, toast fruit and juice  Snk ( AM): toast with tea and sugar  L ( PM): eats out or microwaves a frozen dinner Snk ( PM): red licorice, chocolate candy or popsicles D ( PM): cooked meal or left overs, chicken sandwich with cranberry relish Snk ( PM):  red licorice, chocolate candy or popsicles Beverages: 2 oz regular Coke 3 x/day, 2 oz milk or fruit juice at breakfast  Usual physical activity: yard work, house work, walks dog daily  Estimated energy needs: 1600 calories 180 g carbohydrates 120 g protein 44 g fat  Progress Towards Goal(s):  In progress.   Nutritional Diagnosis:  NI-5.8.4 Inconsistent carbohydrate intake As related to Pre-diabetes.  As evidenced by mildly elevated FBG.    Intervention:  Nutrition counseling provided on basic pathophysiology of diabetes, rationale for consistent carb meals, value of regular activity daily. Pt instructions: Patient plans to aim for 3 - 4 Carb choices per meal, 2 per snack Read food labels for Total Carbohydrate of foods Continue with current activity level of managing her home and yard work, and walking dog daily Handouts given during visit  include:  Living Well with Type 2 Diabetes per patient request  Carb Counting and Beyond and Label Reading handouts  Monitoring/Evaluation:  Dietary intake, exercise, label reading, and body weight prn.

## 2011-12-05 ENCOUNTER — Other Ambulatory Visit (HOSPITAL_BASED_OUTPATIENT_CLINIC_OR_DEPARTMENT_OTHER): Payer: Medicare Other

## 2011-12-05 ENCOUNTER — Other Ambulatory Visit: Payer: Self-pay | Admitting: Oncology

## 2011-12-05 DIAGNOSIS — M899 Disorder of bone, unspecified: Secondary | ICD-10-CM

## 2011-12-05 DIAGNOSIS — C50419 Malignant neoplasm of upper-outer quadrant of unspecified female breast: Secondary | ICD-10-CM

## 2011-12-05 DIAGNOSIS — Z170 Estrogen receptor positive status [ER+]: Secondary | ICD-10-CM

## 2011-12-05 LAB — CBC & DIFF AND RETIC
BASO%: 0.2 % (ref 0.0–2.0)
LYMPH%: 36.5 % (ref 14.0–49.7)
MCHC: 33 g/dL (ref 31.5–36.0)
MONO#: 0.4 10*3/uL (ref 0.1–0.9)
Platelets: 177 10*3/uL (ref 145–400)
RBC: 3.99 10*6/uL (ref 3.70–5.45)
RDW: 12.4 % (ref 11.2–14.5)
Retic %: 1.08 % (ref 0.70–2.10)
WBC: 4.9 10*3/uL (ref 3.9–10.3)
lymph#: 1.8 10*3/uL (ref 0.9–3.3)

## 2011-12-05 LAB — COMPREHENSIVE METABOLIC PANEL
Alkaline Phosphatase: 38 U/L — ABNORMAL LOW (ref 39–117)
Glucose, Bld: 118 mg/dL — ABNORMAL HIGH (ref 70–99)
Sodium: 142 mEq/L (ref 135–145)
Total Bilirubin: 0.4 mg/dL (ref 0.3–1.2)
Total Protein: 6.3 g/dL (ref 6.0–8.3)

## 2011-12-05 LAB — CHCC SMEAR

## 2011-12-05 LAB — VITAMIN D 25 HYDROXY (VIT D DEFICIENCY, FRACTURES): Vit D, 25-Hydroxy: 43 ng/mL (ref 30–89)

## 2011-12-12 ENCOUNTER — Ambulatory Visit (HOSPITAL_BASED_OUTPATIENT_CLINIC_OR_DEPARTMENT_OTHER): Payer: Medicare Other | Admitting: Oncology

## 2011-12-12 ENCOUNTER — Telehealth: Payer: Self-pay | Admitting: *Deleted

## 2011-12-12 DIAGNOSIS — C50919 Malignant neoplasm of unspecified site of unspecified female breast: Secondary | ICD-10-CM

## 2011-12-12 DIAGNOSIS — E559 Vitamin D deficiency, unspecified: Secondary | ICD-10-CM

## 2011-12-12 NOTE — Progress Notes (Signed)
Hematology and Oncology Follow Up Visit  [name] [id number] Jul 16, 1946 65 y.o. 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  S/P right lumpectomy with node dissection followed by radiation completed 04/30/2007.  Declined tamoxifen therapy. 1. History of anemia with iron deficiency.  S/P iron replacement. 2.     History of depression and anxiety.  3.     History of narcotic use  Interim History:  There have been no intercurrent illness, hospitalizations or medication changes. Taking some hydrocodone for pain related to fibromyalgia Medications: I have reviewed the patient's current medications.  Allergies: No Known Allergies  Past Medical History, Surgical history, Social history, and Family History were reviewed and updated.  Review of Systems: Constitutional:  Negative for fever, chills, night sweats, anorexia, weight loss, pain. Cardiovascular: no chest pain or dyspnea on exertion Respiratory: no cough, shortness of breath, or wheezing Neurological: no TIA or stroke symptoms Dermatological: negative ENT: negative Skin Gastrointestinal: no abdominal pain, change in bowel habits, or black or bloody stools Genito-Urinary: no dysuria, trouble voiding, or hematuria Hematological and Lymphatic: negative Breast: negative for breast lumps Musculoskeletal: positive for - joint pain, muscle pain and  Hips, knees, hx of colitis Remaining ROS negative.  Physical Exam: Blood pressure 123/76, pulse 82, temperature 98.3 F (36.8 C), height 5\' 6"  (1.676 m), weight 148 lb 14.4 oz (67.541 kg). ECOG: 0 General appearance: alert, cooperative and appears stated age Head: Normocephalic, without obvious abnormality, atraumatic Neck: no adenopathy, no carotid bruit, no JVD, supple, symmetrical, trachea midline and thyroid not enlarged, symmetric,  no tenderness/mass/nodules Lymph nodes: Cervical, supraclavicular, and axillary nodes normal. Cardiac : regular rate and rhythm, no murmurs or gallops Pulmonary:clear to auscultation bilaterally and normal percussion bilaterally Breasts: inspection negative, no nipple discharge or bleeding, no masses or nodularity palpable Abdomen:soft, non-tender; bowel sounds normal; no masses,  no organomegaly Extremities negative Neuro: alert, oriented, normal speech, no focal findings or movement disorder noted  Lab Results: Lab Results  Component Value Date   WBC 4.9 12/05/2011   HGB 12.1 12/05/2011   HCT 36.7 12/05/2011   MCV 92.0 12/05/2011   PLT 177 12/05/2011     Chemistry      Component Value Date/Time   NA 142 12/05/2011 1342   NA 142 12/05/2011 1342   NA 142 12/05/2011 1342   NA 142 12/05/2011 1342   K 4.3 12/05/2011 1342   K 4.3 12/05/2011 1342   K 4.3 12/05/2011 1342   K 4.3 12/05/2011 1342   CL 105 12/05/2011 1342   CL 105 12/05/2011 1342   CL 105 12/05/2011 1342   CL 105 12/05/2011 1342   CO2 27 12/05/2011 1342   CO2 27 12/05/2011 1342   CO2 27 12/05/2011 1342   CO2 27 12/05/2011 1342   BUN 13 12/05/2011 1342   BUN 13 12/05/2011 1342   BUN 13 12/05/2011 1342   BUN 13 12/05/2011 1342   CREATININE 0.82 12/05/2011 1342   CREATININE 0.82 12/05/2011 1342   CREATININE 0.82 12/05/2011 1342   CREATININE 0.82 12/05/2011 1342      Component Value Date/Time   CALCIUM 9.1 12/05/2011 1342   CALCIUM 9.1 12/05/2011 1342   CALCIUM 9.1 12/05/2011 1342   CALCIUM 9.1 12/05/2011 1342   ALKPHOS 38* 12/05/2011 1342   ALKPHOS 38* 12/05/2011 1342   ALKPHOS 38* 12/05/2011 1342   ALKPHOS 38*  12/05/2011 1342   AST 17 12/05/2011 1342   AST 17 12/05/2011 1342   AST 17 12/05/2011 1342   AST 17 12/05/2011 1342   ALT 9 12/05/2011 1342   ALT 9 12/05/2011 1342   ALT 9 12/05/2011 1342   ALT 9 12/05/2011 1342   BILITOT 0.4 12/05/2011 1342   BILITOT 0.4 12/05/2011 1342   BILITOT 0.4  12/05/2011 1342   BILITOT 0.4 12/05/2011 1342      .pathology. Radiological Studies: chest X-ray n/a Mammogram  6/12- wnl Bone density n/a  Impression and Plan: [name] is doing well, she is concerned repossible narcotic abuse issues, i will refer her to dr Noe Gens.  More than 50% of the visit was spent in patient-related counselling   Pierce Crane, MD 12/19/201211:12 AM

## 2011-12-12 NOTE — Telephone Encounter (Signed)
called dr.peters and informed her the patient would like to see her gave patient appointment for 11-2012

## 2011-12-31 ENCOUNTER — Ambulatory Visit (INDEPENDENT_AMBULATORY_CARE_PROVIDER_SITE_OTHER): Payer: Medicare Other | Admitting: Psychiatry

## 2011-12-31 DIAGNOSIS — F988 Other specified behavioral and emotional disorders with onset usually occurring in childhood and adolescence: Secondary | ICD-10-CM

## 2011-12-31 DIAGNOSIS — F319 Bipolar disorder, unspecified: Secondary | ICD-10-CM

## 2011-12-31 NOTE — Progress Notes (Unsigned)
Patient seen for individual psychotherapy.  Patient returns to treatment after an absence of three years.  She acknowledges abusing her pain medications, feeling h & h, guilty, with anhedonia and extremely low self-esteem.  She c/o problems with her daughter, lack of friends and financial stressors. She denies active S.I.'s but feels fearful of aging and loss of function on a physical level.  Recommended patient discuss a change of antidepressants with her primary MD, Fenley Hilts,since she has been on wellbutrin and lexapro for many years;  join the tai chi and yoga classes at the [facility] to help her relax and retain physical function; and return to regular church attendance for spiritual support and fellowship.  She is agreeable to continuing in counseling and her next appointment is 01-08-2012.

## 2012-01-01 ENCOUNTER — Ambulatory Visit: Payer: Medicare Other | Admitting: *Deleted

## 2012-01-03 DIAGNOSIS — F321 Major depressive disorder, single episode, moderate: Secondary | ICD-10-CM | POA: Diagnosis not present

## 2012-01-03 DIAGNOSIS — M25559 Pain in unspecified hip: Secondary | ICD-10-CM | POA: Diagnosis not present

## 2012-01-03 DIAGNOSIS — M25579 Pain in unspecified ankle and joints of unspecified foot: Secondary | ICD-10-CM | POA: Diagnosis not present

## 2012-01-08 ENCOUNTER — Ambulatory Visit: Payer: Medicare Other | Admitting: Psychiatry

## 2012-01-08 NOTE — Progress Notes (Unsigned)
Patient cancelled appointment for today because she has the flu.  She will call to reschedule.

## 2012-03-11 DIAGNOSIS — R259 Unspecified abnormal involuntary movements: Secondary | ICD-10-CM | POA: Diagnosis not present

## 2012-03-11 DIAGNOSIS — R634 Abnormal weight loss: Secondary | ICD-10-CM | POA: Diagnosis not present

## 2012-03-11 DIAGNOSIS — R197 Diarrhea, unspecified: Secondary | ICD-10-CM | POA: Diagnosis not present

## 2012-03-11 DIAGNOSIS — R300 Dysuria: Secondary | ICD-10-CM | POA: Diagnosis not present

## 2012-03-17 DIAGNOSIS — R109 Unspecified abdominal pain: Secondary | ICD-10-CM | POA: Diagnosis not present

## 2012-03-27 DIAGNOSIS — K589 Irritable bowel syndrome without diarrhea: Secondary | ICD-10-CM | POA: Diagnosis not present

## 2012-03-27 DIAGNOSIS — R1013 Epigastric pain: Secondary | ICD-10-CM | POA: Diagnosis not present

## 2012-03-27 DIAGNOSIS — R142 Eructation: Secondary | ICD-10-CM | POA: Diagnosis not present

## 2012-03-31 ENCOUNTER — Ambulatory Visit: Payer: Medicare Other | Admitting: Psychiatry

## 2012-04-02 DIAGNOSIS — D129 Benign neoplasm of anus and anal canal: Secondary | ICD-10-CM | POA: Diagnosis not present

## 2012-04-02 DIAGNOSIS — K2970 Gastritis, unspecified, without bleeding: Secondary | ICD-10-CM | POA: Diagnosis not present

## 2012-04-02 DIAGNOSIS — K5900 Constipation, unspecified: Secondary | ICD-10-CM | POA: Diagnosis not present

## 2012-04-02 DIAGNOSIS — R1013 Epigastric pain: Secondary | ICD-10-CM | POA: Diagnosis not present

## 2012-04-02 DIAGNOSIS — K6389 Other specified diseases of intestine: Secondary | ICD-10-CM | POA: Diagnosis not present

## 2012-04-02 DIAGNOSIS — K219 Gastro-esophageal reflux disease without esophagitis: Secondary | ICD-10-CM | POA: Diagnosis not present

## 2012-04-02 DIAGNOSIS — D126 Benign neoplasm of colon, unspecified: Secondary | ICD-10-CM | POA: Diagnosis not present

## 2012-04-02 DIAGNOSIS — D128 Benign neoplasm of rectum: Secondary | ICD-10-CM | POA: Diagnosis not present

## 2012-04-02 DIAGNOSIS — Z1211 Encounter for screening for malignant neoplasm of colon: Secondary | ICD-10-CM | POA: Diagnosis not present

## 2012-04-02 DIAGNOSIS — K2990 Gastroduodenitis, unspecified, without bleeding: Secondary | ICD-10-CM | POA: Diagnosis not present

## 2012-04-03 ENCOUNTER — Ambulatory Visit (INDEPENDENT_AMBULATORY_CARE_PROVIDER_SITE_OTHER): Payer: Medicare Other | Admitting: Psychiatry

## 2012-04-03 DIAGNOSIS — F988 Other specified behavioral and emotional disorders with onset usually occurring in childhood and adolescence: Secondary | ICD-10-CM | POA: Diagnosis not present

## 2012-04-03 DIAGNOSIS — F319 Bipolar disorder, unspecified: Secondary | ICD-10-CM | POA: Diagnosis not present

## 2012-04-07 NOTE — Progress Notes (Addendum)
04-03-2012  Patient seen for individual psychotherapy.  She returns to treatment after several months and a stint in rehab to resolve over-use of prescription medications.  Session focused on establishing goals for treatment and assessing patient's recovery program. Patient will return on 04-09-2012 for f/u appointment.

## 2012-04-09 ENCOUNTER — Ambulatory Visit (INDEPENDENT_AMBULATORY_CARE_PROVIDER_SITE_OTHER): Payer: Medicare Other | Admitting: Psychiatry

## 2012-04-09 DIAGNOSIS — F319 Bipolar disorder, unspecified: Secondary | ICD-10-CM | POA: Diagnosis not present

## 2012-04-09 DIAGNOSIS — F988 Other specified behavioral and emotional disorders with onset usually occurring in childhood and adolescence: Secondary | ICD-10-CM

## 2012-04-09 NOTE — Progress Notes (Signed)
04-09-2012  Patient seen for individual psychotherapy.  She struggles with self-doubt and physical problems that result from her emotional issues.  Recommended she find a p/t job or volunteer several days a week to balance her life between activity and inactivity.  Next appointment 04-16-2012.

## 2012-04-15 ENCOUNTER — Ambulatory Visit (INDEPENDENT_AMBULATORY_CARE_PROVIDER_SITE_OTHER): Payer: Medicare Other | Admitting: Psychiatry

## 2012-04-15 DIAGNOSIS — F319 Bipolar disorder, unspecified: Secondary | ICD-10-CM | POA: Diagnosis not present

## 2012-04-15 DIAGNOSIS — F988 Other specified behavioral and emotional disorders with onset usually occurring in childhood and adolescence: Secondary | ICD-10-CM | POA: Diagnosis not present

## 2012-04-15 NOTE — Progress Notes (Signed)
04-15-2012  Patient seen for individual psychotherapy.  She is making gains in treatment:  took bubble bath, practiced deep breathing and stopped stomach ache, went to lunch with a friend, worked on sorting old mail out, and expressed gratitude for her home.  Helped patient with toxic words and re-framing situations from point of power versus victim stance.  Next appointment 04-24-2012.

## 2012-04-24 ENCOUNTER — Ambulatory Visit: Payer: Medicare Other | Admitting: Psychiatry

## 2012-04-24 NOTE — Progress Notes (Signed)
04-24-2012  Patient called to cancel her appointment for today because she fell and is also feeling ill.  Rescheduled her for 05-01-2012.

## 2012-04-27 ENCOUNTER — Ambulatory Visit: Payer: Medicare Other

## 2012-04-27 ENCOUNTER — Ambulatory Visit (INDEPENDENT_AMBULATORY_CARE_PROVIDER_SITE_OTHER): Payer: Medicare Other | Admitting: Emergency Medicine

## 2012-04-27 VITALS — BP 125/80 | HR 97 | Temp 98.3°F | Resp 18 | Ht 65.0 in | Wt 152.4 lb

## 2012-04-27 DIAGNOSIS — M542 Cervicalgia: Secondary | ICD-10-CM

## 2012-04-27 DIAGNOSIS — S139XXA Sprain of joints and ligaments of unspecified parts of neck, initial encounter: Secondary | ICD-10-CM | POA: Diagnosis not present

## 2012-04-27 DIAGNOSIS — S335XXA Sprain of ligaments of lumbar spine, initial encounter: Secondary | ICD-10-CM | POA: Diagnosis not present

## 2012-04-27 DIAGNOSIS — S239XXA Sprain of unspecified parts of thorax, initial encounter: Secondary | ICD-10-CM

## 2012-04-27 DIAGNOSIS — M549 Dorsalgia, unspecified: Secondary | ICD-10-CM

## 2012-04-27 MED ORDER — HYDROCODONE-ACETAMINOPHEN 5-325 MG PO TABS: 1.0000 | ORAL_TABLET | Freq: Four times a day (QID) | ORAL | Status: AC | PRN

## 2012-04-27 NOTE — Progress Notes (Signed)
  Subjective:    Patient ID: [name], female    DOB: 11-Jul-1946, 66 y.o.   MRN: [id number]  HPI usual state of health until yesterday when while sitting on a chair with wheels she fell off of the chair and onto her buttocks. She now complains of pain in her lower back midthoracic area as well as up into her neck she has pain with movement of the neck or the lower back.    Review of Systems she did not have any loss of consciousness with her episode.     Objective:   Physical Exam  Musculoskeletal:       There is tenderness over the paracervical muscles parathoracic muscles and. Lumbar muscles. Deep tendon reflexes of the upper and lower extremities are 2+ and symmetrical. Straight leg raising is negative in both legs. She states that when she plantar flexes her feet against resistance she feels pain in her lower lumbar spine to   [facility] reading (PRIMARY) by  Dr.Jaxn Chiquito LS spine films are normal T-spine films are normal C-spine films showed degenerative disc disease at C5-C6 with a retro-listhesis of C6 of approximately 2 mm there is also degenerative disc disease at C6-C7         Assessment & Plan:    Patient with an axial load type injury with complaints of pain in her entire spine. We'll check two-view films of her C-spine T-spine and LS-spine

## 2012-05-01 ENCOUNTER — Ambulatory Visit (INDEPENDENT_AMBULATORY_CARE_PROVIDER_SITE_OTHER): Payer: Medicare Other | Admitting: Psychiatry

## 2012-05-01 DIAGNOSIS — F319 Bipolar disorder, unspecified: Secondary | ICD-10-CM | POA: Diagnosis not present

## 2012-05-01 DIAGNOSIS — M542 Cervicalgia: Secondary | ICD-10-CM | POA: Diagnosis not present

## 2012-05-01 DIAGNOSIS — F988 Other specified behavioral and emotional disorders with onset usually occurring in childhood and adolescence: Secondary | ICD-10-CM

## 2012-05-01 DIAGNOSIS — M25549 Pain in joints of unspecified hand: Secondary | ICD-10-CM | POA: Diagnosis not present

## 2012-05-15 ENCOUNTER — Ambulatory Visit: Payer: Medicare Other | Admitting: Psychiatry

## 2012-05-22 DIAGNOSIS — M25579 Pain in unspecified ankle and joints of unspecified foot: Secondary | ICD-10-CM | POA: Diagnosis not present

## 2012-05-28 ENCOUNTER — Ambulatory Visit (INDEPENDENT_AMBULATORY_CARE_PROVIDER_SITE_OTHER): Payer: Medicare Other | Admitting: Psychiatry

## 2012-05-28 DIAGNOSIS — F988 Other specified behavioral and emotional disorders with onset usually occurring in childhood and adolescence: Secondary | ICD-10-CM

## 2012-05-28 DIAGNOSIS — F319 Bipolar disorder, unspecified: Secondary | ICD-10-CM

## 2012-05-28 NOTE — Progress Notes (Signed)
05-28-2012  Patient seen for individual psychotherapy.  Session focused on coping skills.  Next appointment 06-04-2012.

## 2012-06-03 ENCOUNTER — Other Ambulatory Visit: Payer: Self-pay | Admitting: Family Medicine

## 2012-06-03 DIAGNOSIS — M542 Cervicalgia: Secondary | ICD-10-CM | POA: Diagnosis not present

## 2012-06-03 DIAGNOSIS — R5381 Other malaise: Secondary | ICD-10-CM | POA: Diagnosis not present

## 2012-06-03 DIAGNOSIS — R509 Fever, unspecified: Secondary | ICD-10-CM | POA: Diagnosis not present

## 2012-06-03 DIAGNOSIS — M25579 Pain in unspecified ankle and joints of unspecified foot: Secondary | ICD-10-CM | POA: Diagnosis not present

## 2012-06-03 DIAGNOSIS — R5383 Other fatigue: Secondary | ICD-10-CM | POA: Diagnosis not present

## 2012-06-03 DIAGNOSIS — M25469 Effusion, unspecified knee: Secondary | ICD-10-CM | POA: Diagnosis not present

## 2012-06-03 DIAGNOSIS — E785 Hyperlipidemia, unspecified: Secondary | ICD-10-CM | POA: Diagnosis not present

## 2012-06-03 DIAGNOSIS — M25569 Pain in unspecified knee: Secondary | ICD-10-CM | POA: Diagnosis not present

## 2012-06-03 DIAGNOSIS — D649 Anemia, unspecified: Secondary | ICD-10-CM | POA: Diagnosis not present

## 2012-06-04 ENCOUNTER — Ambulatory Visit (INDEPENDENT_AMBULATORY_CARE_PROVIDER_SITE_OTHER): Payer: Medicare Other | Admitting: Psychiatry

## 2012-06-04 DIAGNOSIS — F319 Bipolar disorder, unspecified: Secondary | ICD-10-CM

## 2012-06-04 DIAGNOSIS — F988 Other specified behavioral and emotional disorders with onset usually occurring in childhood and adolescence: Secondary | ICD-10-CM | POA: Diagnosis not present

## 2012-06-05 DIAGNOSIS — Z853 Personal history of malignant neoplasm of breast: Secondary | ICD-10-CM | POA: Diagnosis not present

## 2012-06-11 ENCOUNTER — Ambulatory Visit (INDEPENDENT_AMBULATORY_CARE_PROVIDER_SITE_OTHER): Payer: Medicare Other | Admitting: Psychiatry

## 2012-06-11 DIAGNOSIS — F319 Bipolar disorder, unspecified: Secondary | ICD-10-CM | POA: Diagnosis not present

## 2012-06-11 DIAGNOSIS — F988 Other specified behavioral and emotional disorders with onset usually occurring in childhood and adolescence: Secondary | ICD-10-CM

## 2012-06-17 ENCOUNTER — Other Ambulatory Visit (HOSPITAL_COMMUNITY): Payer: Self-pay | Admitting: Family Medicine

## 2012-06-17 DIAGNOSIS — M19049 Primary osteoarthritis, unspecified hand: Secondary | ICD-10-CM | POA: Diagnosis not present

## 2012-06-17 DIAGNOSIS — R0781 Pleurodynia: Secondary | ICD-10-CM

## 2012-06-17 DIAGNOSIS — M25549 Pain in joints of unspecified hand: Secondary | ICD-10-CM | POA: Diagnosis not present

## 2012-06-17 DIAGNOSIS — R079 Chest pain, unspecified: Secondary | ICD-10-CM | POA: Diagnosis not present

## 2012-06-18 ENCOUNTER — Ambulatory Visit: Payer: Medicare Other | Admitting: Psychiatry

## 2012-06-20 ENCOUNTER — Ambulatory Visit: Payer: Medicare Other

## 2012-06-20 ENCOUNTER — Ambulatory Visit (INDEPENDENT_AMBULATORY_CARE_PROVIDER_SITE_OTHER): Payer: Medicare Other | Admitting: Emergency Medicine

## 2012-06-20 VITALS — BP 129/83 | HR 98 | Temp 98.7°F | Resp 18 | Ht 66.5 in | Wt 152.0 lb

## 2012-06-20 DIAGNOSIS — M542 Cervicalgia: Secondary | ICD-10-CM

## 2012-06-20 DIAGNOSIS — M549 Dorsalgia, unspecified: Secondary | ICD-10-CM

## 2012-06-20 MED ORDER — HYDROCODONE-ACETAMINOPHEN 5-325 MG PO TABS: 1.0000 | ORAL_TABLET | Freq: Four times a day (QID) | ORAL | Status: AC | PRN

## 2012-06-20 NOTE — Progress Notes (Signed)
  Subjective:    Patient ID: [name], female    DOB: 1946/12/08, 66 y.o.   MRN: [id number]  HPI patient states she was in her usual state of health until last night when stepping down she tripped and fell striking her tailbone with an axial load to her low back and her neck. She has not had any radicular pain into her arm or legs. She has pain with complete flexion or extension of her neck.    Review of Systems     Objective:   Physical Exam HEENT exam is unremarkable. She has tenderness along the paracervical muscles. She has pain with full flexion and extension. She has arthritic changes of both wrists but no focal weakness of her upper extremities .  Tendon reflexes of the lower extremities are 2+. She has tenderness over the lower LS spine .  [facility] reading (PRIMARY) by  Dr.Alis Sawchuk patient has degenerative disc disease and arthritic changes C5-6-7 on the lumbar spine there is degenerative change at L5-S1 but no fractures are seen. There is significant aortic calcification noted      Assessment & Plan:     Patient to followup with Dr. Jorge Mandril. . we'll go ahead and give #30 hydrocodone 5 mg with no refills

## 2012-06-24 ENCOUNTER — Ambulatory Visit (HOSPITAL_COMMUNITY): Payer: Medicare Other

## 2012-06-24 ENCOUNTER — Encounter (HOSPITAL_COMMUNITY): Payer: Medicare Other

## 2012-06-30 ENCOUNTER — Encounter (HOSPITAL_COMMUNITY): Payer: Medicare Other

## 2012-06-30 ENCOUNTER — Inpatient Hospital Stay (HOSPITAL_COMMUNITY): Admission: RE | Admit: 2012-06-30 | Payer: Medicare Other | Source: Ambulatory Visit

## 2012-07-02 ENCOUNTER — Ambulatory Visit (INDEPENDENT_AMBULATORY_CARE_PROVIDER_SITE_OTHER): Payer: Medicare Other | Admitting: Psychiatry

## 2012-07-02 DIAGNOSIS — F319 Bipolar disorder, unspecified: Secondary | ICD-10-CM

## 2012-07-02 DIAGNOSIS — F988 Other specified behavioral and emotional disorders with onset usually occurring in childhood and adolescence: Secondary | ICD-10-CM

## 2012-07-02 NOTE — Progress Notes (Signed)
07-02-2012  Patient seen for individual psychotherapy.  Next appointment 07-09-2012.

## 2012-07-04 ENCOUNTER — Ambulatory Visit (HOSPITAL_COMMUNITY): Payer: Medicare Other

## 2012-07-04 ENCOUNTER — Encounter (HOSPITAL_COMMUNITY): Payer: Medicare Other

## 2012-07-09 ENCOUNTER — Ambulatory Visit: Payer: Medicare Other | Admitting: Psychiatry

## 2012-07-10 ENCOUNTER — Encounter (HOSPITAL_COMMUNITY): Payer: Medicare Other

## 2012-07-10 ENCOUNTER — Ambulatory Visit (HOSPITAL_COMMUNITY): Payer: Medicare Other

## 2012-07-16 ENCOUNTER — Encounter (HOSPITAL_COMMUNITY)
Admission: RE | Admit: 2012-07-16 | Discharge: 2012-07-16 | Disposition: A | Discharge status: Home / Self Care | Payer: Medicare Other | Source: Ambulatory Visit | Attending: Family Medicine | Admitting: Family Medicine

## 2012-07-16 ENCOUNTER — Ambulatory Visit: Payer: Medicare Other | Admitting: Psychiatry

## 2012-07-16 ENCOUNTER — Ambulatory Visit (HOSPITAL_COMMUNITY): Payer: Medicare Other

## 2012-07-16 DIAGNOSIS — M47812 Spondylosis without myelopathy or radiculopathy, cervical region: Secondary | ICD-10-CM | POA: Diagnosis not present

## 2012-07-16 DIAGNOSIS — R079 Chest pain, unspecified: Secondary | ICD-10-CM | POA: Diagnosis not present

## 2012-07-16 DIAGNOSIS — Z853 Personal history of malignant neoplasm of breast: Secondary | ICD-10-CM | POA: Insufficient documentation

## 2012-07-16 DIAGNOSIS — M19049 Primary osteoarthritis, unspecified hand: Secondary | ICD-10-CM | POA: Diagnosis not present

## 2012-07-16 DIAGNOSIS — M542 Cervicalgia: Secondary | ICD-10-CM | POA: Insufficient documentation

## 2012-07-16 MED ORDER — TECHNETIUM TC 99M MEDRONATE IV KIT
23.8000 | PACK | Freq: Once | INTRAVENOUS | Status: AC | PRN
  Administered 2012-07-16: 23.8 via INTRAVENOUS

## 2012-07-29 DIAGNOSIS — M542 Cervicalgia: Secondary | ICD-10-CM | POA: Diagnosis not present

## 2012-07-29 DIAGNOSIS — M47812 Spondylosis without myelopathy or radiculopathy, cervical region: Secondary | ICD-10-CM | POA: Diagnosis not present

## 2012-07-30 ENCOUNTER — Ambulatory Visit (INDEPENDENT_AMBULATORY_CARE_PROVIDER_SITE_OTHER): Payer: Medicare Other | Admitting: Psychiatry

## 2012-07-30 DIAGNOSIS — F988 Other specified behavioral and emotional disorders with onset usually occurring in childhood and adolescence: Secondary | ICD-10-CM

## 2012-07-30 DIAGNOSIS — F319 Bipolar disorder, unspecified: Secondary | ICD-10-CM | POA: Diagnosis not present

## 2012-07-31 DIAGNOSIS — M47812 Spondylosis without myelopathy or radiculopathy, cervical region: Secondary | ICD-10-CM | POA: Diagnosis not present

## 2012-07-31 DIAGNOSIS — M542 Cervicalgia: Secondary | ICD-10-CM | POA: Diagnosis not present

## 2012-08-05 NOTE — Progress Notes (Signed)
08-05-2012  Patient cancelled tomorrow's appointment because she is helping her daughter with care of ex-husband as he recovers from serious illness.  Next appointment 08-13-2012.

## 2012-08-06 ENCOUNTER — Ambulatory Visit: Payer: Medicare Other | Admitting: Psychiatry

## 2012-08-13 ENCOUNTER — Ambulatory Visit: Payer: Medicare Other | Admitting: Psychiatry

## 2012-08-14 ENCOUNTER — Ambulatory Visit (INDEPENDENT_AMBULATORY_CARE_PROVIDER_SITE_OTHER): Payer: Medicare Other | Admitting: Psychiatry

## 2012-08-14 DIAGNOSIS — F988 Other specified behavioral and emotional disorders with onset usually occurring in childhood and adolescence: Secondary | ICD-10-CM

## 2012-08-14 DIAGNOSIS — F319 Bipolar disorder, unspecified: Secondary | ICD-10-CM | POA: Diagnosis not present

## 2012-08-14 NOTE — Progress Notes (Signed)
08-14-2012 Patient seen for individual psychotherapy.  Next appointment 08-27-2012.

## 2012-08-20 ENCOUNTER — Ambulatory Visit: Payer: Medicare Other | Admitting: Psychiatry

## 2012-08-27 ENCOUNTER — Ambulatory Visit: Payer: Medicare Other | Admitting: Psychiatry

## 2012-08-28 ENCOUNTER — Ambulatory Visit (INDEPENDENT_AMBULATORY_CARE_PROVIDER_SITE_OTHER): Payer: Medicare Other | Admitting: Psychiatry

## 2012-08-28 DIAGNOSIS — F988 Other specified behavioral and emotional disorders with onset usually occurring in childhood and adolescence: Secondary | ICD-10-CM | POA: Diagnosis not present

## 2012-08-28 DIAGNOSIS — F319 Bipolar disorder, unspecified: Secondary | ICD-10-CM | POA: Diagnosis not present

## 2012-09-01 NOTE — Progress Notes (Incomplete)
08-28-2012  Patient seen for individual psychotherapy.  Next appointment 09-17-2012.

## 2012-09-03 ENCOUNTER — Ambulatory Visit: Payer: Medicare Other | Admitting: Psychiatry

## 2012-09-09 ENCOUNTER — Ambulatory Visit (INDEPENDENT_AMBULATORY_CARE_PROVIDER_SITE_OTHER): Payer: Medicare Other | Admitting: Psychiatry

## 2012-09-09 DIAGNOSIS — F988 Other specified behavioral and emotional disorders with onset usually occurring in childhood and adolescence: Secondary | ICD-10-CM

## 2012-09-09 DIAGNOSIS — F319 Bipolar disorder, unspecified: Secondary | ICD-10-CM

## 2012-09-09 NOTE — Progress Notes (Signed)
09-09-2012  Patient seen for individual psychotherapy.  Next appointment 08-26-2012.

## 2012-09-12 DIAGNOSIS — R279 Unspecified lack of coordination: Secondary | ICD-10-CM | POA: Diagnosis not present

## 2012-09-12 DIAGNOSIS — IMO0001 Reserved for inherently not codable concepts without codable children: Secondary | ICD-10-CM | POA: Diagnosis not present

## 2012-09-12 DIAGNOSIS — M542 Cervicalgia: Secondary | ICD-10-CM | POA: Diagnosis not present

## 2012-09-15 DIAGNOSIS — R059 Cough, unspecified: Secondary | ICD-10-CM | POA: Diagnosis not present

## 2012-09-15 DIAGNOSIS — Z23 Encounter for immunization: Secondary | ICD-10-CM | POA: Diagnosis not present

## 2012-09-15 DIAGNOSIS — R05 Cough: Secondary | ICD-10-CM | POA: Diagnosis not present

## 2012-09-15 DIAGNOSIS — M25579 Pain in unspecified ankle and joints of unspecified foot: Secondary | ICD-10-CM | POA: Diagnosis not present

## 2012-09-23 DIAGNOSIS — M47812 Spondylosis without myelopathy or radiculopathy, cervical region: Secondary | ICD-10-CM | POA: Diagnosis not present

## 2012-09-25 ENCOUNTER — Ambulatory Visit: Payer: Medicare Other | Admitting: Psychiatry

## 2012-09-30 ENCOUNTER — Ambulatory Visit (INDEPENDENT_AMBULATORY_CARE_PROVIDER_SITE_OTHER): Payer: Medicare Other | Admitting: Psychiatry

## 2012-09-30 DIAGNOSIS — F319 Bipolar disorder, unspecified: Secondary | ICD-10-CM

## 2012-09-30 DIAGNOSIS — F988 Other specified behavioral and emotional disorders with onset usually occurring in childhood and adolescence: Secondary | ICD-10-CM | POA: Diagnosis not present

## 2012-10-02 DIAGNOSIS — M25579 Pain in unspecified ankle and joints of unspecified foot: Secondary | ICD-10-CM | POA: Diagnosis not present

## 2012-10-07 ENCOUNTER — Ambulatory Visit: Payer: Medicare Other | Admitting: Psychiatry

## 2012-10-07 DIAGNOSIS — M542 Cervicalgia: Secondary | ICD-10-CM | POA: Diagnosis not present

## 2012-10-07 DIAGNOSIS — M47812 Spondylosis without myelopathy or radiculopathy, cervical region: Secondary | ICD-10-CM | POA: Diagnosis not present

## 2012-10-14 ENCOUNTER — Ambulatory Visit: Payer: Medicare Other | Admitting: Psychiatry

## 2012-10-24 DIAGNOSIS — M542 Cervicalgia: Secondary | ICD-10-CM | POA: Diagnosis not present

## 2012-10-24 DIAGNOSIS — Z23 Encounter for immunization: Secondary | ICD-10-CM | POA: Diagnosis not present

## 2012-10-24 DIAGNOSIS — M25579 Pain in unspecified ankle and joints of unspecified foot: Secondary | ICD-10-CM | POA: Diagnosis not present

## 2012-10-24 DIAGNOSIS — R059 Cough, unspecified: Secondary | ICD-10-CM | POA: Diagnosis not present

## 2012-10-24 DIAGNOSIS — R05 Cough: Secondary | ICD-10-CM | POA: Diagnosis not present

## 2012-10-28 ENCOUNTER — Ambulatory Visit (INDEPENDENT_AMBULATORY_CARE_PROVIDER_SITE_OTHER): Payer: Medicare Other | Admitting: Psychiatry

## 2012-10-28 DIAGNOSIS — F319 Bipolar disorder, unspecified: Secondary | ICD-10-CM

## 2012-10-28 DIAGNOSIS — F988 Other specified behavioral and emotional disorders with onset usually occurring in childhood and adolescence: Secondary | ICD-10-CM | POA: Diagnosis not present

## 2012-11-11 ENCOUNTER — Ambulatory Visit (INDEPENDENT_AMBULATORY_CARE_PROVIDER_SITE_OTHER): Payer: Medicare Other | Admitting: Psychiatry

## 2012-11-11 DIAGNOSIS — F988 Other specified behavioral and emotional disorders with onset usually occurring in childhood and adolescence: Secondary | ICD-10-CM | POA: Diagnosis not present

## 2012-11-11 DIAGNOSIS — F319 Bipolar disorder, unspecified: Secondary | ICD-10-CM

## 2012-11-14 DIAGNOSIS — M25579 Pain in unspecified ankle and joints of unspecified foot: Secondary | ICD-10-CM | POA: Diagnosis not present

## 2012-11-14 DIAGNOSIS — M76829 Posterior tibial tendinitis, unspecified leg: Secondary | ICD-10-CM | POA: Diagnosis not present

## 2012-11-24 DIAGNOSIS — M19079 Primary osteoarthritis, unspecified ankle and foot: Secondary | ICD-10-CM | POA: Diagnosis not present

## 2012-11-25 ENCOUNTER — Ambulatory Visit: Payer: Medicare Other | Admitting: Psychiatry

## 2012-11-25 DIAGNOSIS — H47329 Drusen of optic disc, unspecified eye: Secondary | ICD-10-CM | POA: Diagnosis not present

## 2012-11-28 DIAGNOSIS — M25579 Pain in unspecified ankle and joints of unspecified foot: Secondary | ICD-10-CM | POA: Diagnosis not present

## 2012-11-28 DIAGNOSIS — M76829 Posterior tibial tendinitis, unspecified leg: Secondary | ICD-10-CM | POA: Diagnosis not present

## 2012-12-09 ENCOUNTER — Ambulatory Visit (INDEPENDENT_AMBULATORY_CARE_PROVIDER_SITE_OTHER): Payer: Medicare Other | Admitting: Psychiatry

## 2012-12-09 DIAGNOSIS — F988 Other specified behavioral and emotional disorders with onset usually occurring in childhood and adolescence: Secondary | ICD-10-CM

## 2012-12-09 DIAGNOSIS — F319 Bipolar disorder, unspecified: Secondary | ICD-10-CM

## 2012-12-11 ENCOUNTER — Telehealth: Payer: Self-pay | Admitting: *Deleted

## 2012-12-11 ENCOUNTER — Other Ambulatory Visit (HOSPITAL_BASED_OUTPATIENT_CLINIC_OR_DEPARTMENT_OTHER): Payer: Medicare Other | Admitting: Lab

## 2012-12-11 ENCOUNTER — Ambulatory Visit (HOSPITAL_BASED_OUTPATIENT_CLINIC_OR_DEPARTMENT_OTHER): Payer: Medicare Other | Admitting: Oncology

## 2012-12-11 VITALS — BP 132/79 | HR 82 | Temp 98.4°F | Resp 20 | Ht 66.5 in | Wt 152.3 lb

## 2012-12-11 DIAGNOSIS — E559 Vitamin D deficiency, unspecified: Secondary | ICD-10-CM

## 2012-12-11 DIAGNOSIS — Z170 Estrogen receptor positive status [ER+]: Secondary | ICD-10-CM | POA: Diagnosis not present

## 2012-12-11 DIAGNOSIS — C50919 Malignant neoplasm of unspecified site of unspecified female breast: Secondary | ICD-10-CM

## 2012-12-11 LAB — CBC WITH DIFFERENTIAL/PLATELET
BASO%: 0.2 % (ref 0.0–2.0)
Basophils Absolute: 0 10*3/uL (ref 0.0–0.1)
EOS%: 1.9 % (ref 0.0–7.0)
HGB: 12 g/dL (ref 11.6–15.9)
MCH: 32 pg (ref 25.1–34.0)
MCHC: 34.2 g/dL (ref 31.5–36.0)
MCV: 93.3 fL (ref 79.5–101.0)
MONO%: 9.2 % (ref 0.0–14.0)
NEUT%: 44.4 % (ref 38.4–76.8)
RDW: 12.9 % (ref 11.2–14.5)
lymph#: 2.2 10*3/uL (ref 0.9–3.3)

## 2012-12-11 LAB — COMPREHENSIVE METABOLIC PANEL (CC13)
AST: 15 U/L (ref 5–34)
Alkaline Phosphatase: 43 U/L (ref 40–150)
BUN: 12 mg/dL (ref 7.0–26.0)
Glucose: 100 mg/dl — ABNORMAL HIGH (ref 70–99)
Sodium: 141 mEq/L (ref 136–145)
Total Bilirubin: 0.43 mg/dL (ref 0.20–1.20)

## 2012-12-11 NOTE — Telephone Encounter (Signed)
Gave patient instructions on getting her appointments for 2014

## 2012-12-11 NOTE — Progress Notes (Signed)
Hematology and Oncology Follow Up Visit  [name] [id number] May 27, 1946 66 y.o. 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  S/P right lumpectomy with node dissection followed by radiation completed 04/30/2007.  Declined tamoxifen therapy. 1. History of anemia with iron deficiency.  S/P iron replacement. 2.     History of depression and anxiety.  3.     History of narcotic use  Interim History:  There have been no intercurrent illness, hospitalizations or medication changes. Of note she is on trazodone. Taking some hydrocodone for pain related to fibromyalgia, positive for hydrocodone as per day which is being supplied to her by her primary care Dr. her last mammogram was in June 2013 at [facility]. This was within normal limits. Medications: I have reviewed the patient's current medications.  Allergies:  Allergies  Allergen Reactions  . Sulfa Drugs Cross Reactors     Past Medical History, Surgical history, Social history, and Family History were reviewed and updated.  Review of Systems: Constitutional:  Negative for fever, chills, night sweats, anorexia, weight loss, pain. Cardiovascular: no chest pain or dyspnea on exertion Respiratory: no cough, shortness of breath, or wheezing Neurological: no TIA or stroke symptoms Dermatological: negative ENT: negative Skin Gastrointestinal: no abdominal pain, change in bowel habits, or black or bloody stools Genito-Urinary: no dysuria, trouble voiding, or hematuria Hematological and Lymphatic: negative Breast: negative for breast lumps Musculoskeletal: positive for - joint pain, muscle pain and  Hips, knees, hx of colitis Remaining ROS negative.  Physical Exam: Blood pressure 132/79, pulse 82, temperature 98.4 F (36.9 C), resp. rate 20, height 5' 6.5" (1.689 m), weight 152 lb 4.8 oz  (69.083 kg). ECOG: 0 General appearance: alert, cooperative and appears stated age Head: Normocephalic, without obvious abnormality, atraumatic Neck: no adenopathy, no carotid bruit, no JVD, supple, symmetrical, trachea midline and thyroid not enlarged, symmetric, no tenderness/mass/nodules Lymph nodes: Cervical, supraclavicular, and axillary nodes normal. Cardiac : regular rate and rhythm, no murmurs or gallops Pulmonary:clear to auscultation bilaterally and normal percussion bilaterally Breasts: inspection negative, no nipple discharge or bleeding, no masses or nodularity palpable Abdomen:soft, non-tender; bowel sounds normal; no masses,  no organomegaly Extremities negative Neuro: alert, oriented, normal speech, no focal findings or movement disorder noted  Lab Results: Lab Results  Component Value Date   WBC 4.9 12/11/2012   HGB 12.0 12/11/2012   HCT 35.1 12/11/2012   MCV 93.3 12/11/2012   PLT 199 12/11/2012     Chemistry      Component Value Date/Time   NA 142 12/05/2011 1342   NA 142 12/05/2011 1342   NA 142 12/05/2011 1342   NA 142 12/05/2011 1342   K 4.3 12/05/2011 1342   K 4.3 12/05/2011 1342   K 4.3 12/05/2011 1342   K 4.3 12/05/2011 1342   CL 105 12/05/2011 1342   CL 105 12/05/2011 1342   CL 105 12/05/2011 1342   CL 105 12/05/2011 1342   CO2 27 12/05/2011 1342   CO2 27 12/05/2011 1342   CO2 27 12/05/2011 1342   CO2 27 12/05/2011 1342   BUN 13 12/05/2011 1342   BUN 13 12/05/2011 1342   BUN 13 12/05/2011 1342   BUN 13 12/05/2011 1342   CREATININE 0.82 12/05/2011 1342   CREATININE 0.82 12/05/2011 1342   CREATININE 0.82 12/05/2011 1342   CREATININE 0.82 12/05/2011 1342  Component Value Date/Time   CALCIUM 9.1 12/05/2011 1342   CALCIUM 9.1 12/05/2011 1342   CALCIUM 9.1 12/05/2011 1342   CALCIUM 9.1 12/05/2011 1342   ALKPHOS 38* 12/05/2011 1342   ALKPHOS 38* 12/05/2011 1342   ALKPHOS 38* 12/05/2011 1342   ALKPHOS 38* 12/05/2011 1342   AST 17  12/05/2011 1342   AST 17 12/05/2011 1342   AST 17 12/05/2011 1342   AST 17 12/05/2011 1342   ALT 9 12/05/2011 1342   ALT 9 12/05/2011 1342   ALT 9 12/05/2011 1342   ALT 9 12/05/2011 1342   BILITOT 0.4 12/05/2011 1342   BILITOT 0.4 12/05/2011 1342   BILITOT 0.4 12/05/2011 1342   BILITOT 0.4 12/05/2011 1342      .pathology. Radiological Studies: chest X-ray n/a Mammogram  6/12- wnl Bone density n/a  Impression and Plan: [name] is doing well, she continues to see Dr. Noe Gens. She will be seen  in a years time with appropriate imaging studies. More than 50% of the visit was spent in patient-related counselling   Pierce Crane, MD 12/19/201312:12 PM

## 2013-01-06 ENCOUNTER — Ambulatory Visit (INDEPENDENT_AMBULATORY_CARE_PROVIDER_SITE_OTHER): Payer: Medicare Other | Admitting: Psychiatry

## 2013-01-06 DIAGNOSIS — F988 Other specified behavioral and emotional disorders with onset usually occurring in childhood and adolescence: Secondary | ICD-10-CM | POA: Diagnosis not present

## 2013-01-06 DIAGNOSIS — F319 Bipolar disorder, unspecified: Secondary | ICD-10-CM

## 2013-01-20 ENCOUNTER — Ambulatory Visit: Payer: Medicare Other | Admitting: Psychiatry

## 2013-02-03 ENCOUNTER — Ambulatory Visit (INDEPENDENT_AMBULATORY_CARE_PROVIDER_SITE_OTHER): Payer: Medicare Other | Admitting: Psychiatry

## 2013-02-03 DIAGNOSIS — F319 Bipolar disorder, unspecified: Secondary | ICD-10-CM

## 2013-02-03 DIAGNOSIS — F988 Other specified behavioral and emotional disorders with onset usually occurring in childhood and adolescence: Secondary | ICD-10-CM

## 2013-02-09 DIAGNOSIS — M76829 Posterior tibial tendinitis, unspecified leg: Secondary | ICD-10-CM | POA: Diagnosis not present

## 2013-02-09 DIAGNOSIS — M25579 Pain in unspecified ankle and joints of unspecified foot: Secondary | ICD-10-CM | POA: Diagnosis not present

## 2013-02-10 ENCOUNTER — Other Ambulatory Visit (HOSPITAL_COMMUNITY): Payer: Self-pay | Admitting: Orthopedic Surgery

## 2013-02-12 DIAGNOSIS — J019 Acute sinusitis, unspecified: Secondary | ICD-10-CM | POA: Diagnosis not present

## 2013-02-12 DIAGNOSIS — G5600 Carpal tunnel syndrome, unspecified upper limb: Secondary | ICD-10-CM | POA: Diagnosis not present

## 2013-02-12 DIAGNOSIS — M25579 Pain in unspecified ankle and joints of unspecified foot: Secondary | ICD-10-CM | POA: Diagnosis not present

## 2013-02-17 ENCOUNTER — Ambulatory Visit: Payer: Medicare Other | Admitting: Psychiatry

## 2013-02-18 ENCOUNTER — Encounter (HOSPITAL_COMMUNITY): Admission: RE | Admit: 2013-02-18 | Payer: Medicare Other | Source: Ambulatory Visit

## 2013-02-25 ENCOUNTER — Encounter (HOSPITAL_COMMUNITY): Admission: RE | Payer: Self-pay | Source: Ambulatory Visit

## 2013-02-25 ENCOUNTER — Ambulatory Visit (HOSPITAL_COMMUNITY): Admission: RE | Admit: 2013-02-25 | Payer: Medicare Other | Source: Ambulatory Visit | Admitting: Orthopedic Surgery

## 2013-02-25 SURGERY — REPAIR, TENDON, ACHILLES
Anesthesia: General | Laterality: Left

## 2013-03-03 ENCOUNTER — Ambulatory Visit (INDEPENDENT_AMBULATORY_CARE_PROVIDER_SITE_OTHER): Payer: Medicare Other | Admitting: Psychiatry

## 2013-03-03 DIAGNOSIS — F319 Bipolar disorder, unspecified: Secondary | ICD-10-CM

## 2013-03-03 DIAGNOSIS — F988 Other specified behavioral and emotional disorders with onset usually occurring in childhood and adolescence: Secondary | ICD-10-CM

## 2013-03-17 ENCOUNTER — Ambulatory Visit: Payer: Medicare Other | Admitting: Psychiatry

## 2013-03-17 DIAGNOSIS — Z800 Family history of malignant neoplasm of digestive organs: Secondary | ICD-10-CM | POA: Diagnosis not present

## 2013-03-17 DIAGNOSIS — K219 Gastro-esophageal reflux disease without esophagitis: Secondary | ICD-10-CM | POA: Diagnosis not present

## 2013-03-17 DIAGNOSIS — R141 Gas pain: Secondary | ICD-10-CM | POA: Diagnosis not present

## 2013-03-17 DIAGNOSIS — R143 Flatulence: Secondary | ICD-10-CM | POA: Diagnosis not present

## 2013-03-17 DIAGNOSIS — K5909 Other constipation: Secondary | ICD-10-CM | POA: Diagnosis not present

## 2013-03-20 ENCOUNTER — Other Ambulatory Visit: Payer: Self-pay | Admitting: Gastroenterology

## 2013-03-20 ENCOUNTER — Ambulatory Visit
Admission: RE | Admit: 2013-03-20 | Discharge: 2013-03-20 | Disposition: A | Discharge status: Home / Self Care | Payer: Medicare Other | Source: Ambulatory Visit | Attending: Gastroenterology | Admitting: Gastroenterology

## 2013-03-20 DIAGNOSIS — K5900 Constipation, unspecified: Secondary | ICD-10-CM

## 2013-03-20 DIAGNOSIS — R109 Unspecified abdominal pain: Secondary | ICD-10-CM | POA: Diagnosis not present

## 2013-03-31 ENCOUNTER — Ambulatory Visit: Payer: Medicare Other | Admitting: Psychiatry

## 2013-04-14 ENCOUNTER — Ambulatory Visit: Payer: Medicare Other | Admitting: Psychiatry

## 2013-04-28 ENCOUNTER — Ambulatory Visit: Payer: Medicare Other | Admitting: Psychiatry

## 2013-05-07 DIAGNOSIS — H1189 Other specified disorders of conjunctiva: Secondary | ICD-10-CM | POA: Diagnosis not present

## 2013-05-12 ENCOUNTER — Ambulatory Visit: Payer: Medicare Other | Admitting: Psychiatry

## 2013-05-19 DIAGNOSIS — R142 Eructation: Secondary | ICD-10-CM | POA: Diagnosis not present

## 2013-05-19 DIAGNOSIS — Z86010 Personal history of colonic polyps: Secondary | ICD-10-CM | POA: Diagnosis not present

## 2013-05-19 DIAGNOSIS — K648 Other hemorrhoids: Secondary | ICD-10-CM | POA: Diagnosis not present

## 2013-05-19 DIAGNOSIS — K5900 Constipation, unspecified: Secondary | ICD-10-CM | POA: Diagnosis not present

## 2013-05-26 ENCOUNTER — Ambulatory Visit: Payer: Medicare Other | Admitting: Psychiatry

## 2013-05-26 DIAGNOSIS — H2510 Age-related nuclear cataract, unspecified eye: Secondary | ICD-10-CM | POA: Diagnosis not present

## 2013-05-26 DIAGNOSIS — H2589 Other age-related cataract: Secondary | ICD-10-CM | POA: Diagnosis not present

## 2013-06-09 ENCOUNTER — Ambulatory Visit: Payer: Medicare Other | Admitting: Psychiatry

## 2013-06-10 DIAGNOSIS — Z853 Personal history of malignant neoplasm of breast: Secondary | ICD-10-CM | POA: Diagnosis not present

## 2013-06-23 ENCOUNTER — Ambulatory Visit: Payer: Medicare Other | Admitting: Psychiatry

## 2013-07-07 ENCOUNTER — Ambulatory Visit: Payer: Medicare Other | Admitting: Psychiatry

## 2013-07-11 ENCOUNTER — Ambulatory Visit (INDEPENDENT_AMBULATORY_CARE_PROVIDER_SITE_OTHER): Payer: Medicare Other | Admitting: Emergency Medicine

## 2013-07-11 VITALS — BP 122/76 | HR 93 | Temp 98.2°F | Resp 16 | Ht 65.0 in | Wt 150.4 lb

## 2013-07-11 DIAGNOSIS — W57XXXA Bitten or stung by nonvenomous insect and other nonvenomous arthropods, initial encounter: Secondary | ICD-10-CM

## 2013-07-11 DIAGNOSIS — S9000XA Contusion of unspecified ankle, initial encounter: Secondary | ICD-10-CM | POA: Diagnosis not present

## 2013-07-11 DIAGNOSIS — S9002XA Contusion of left ankle, initial encounter: Secondary | ICD-10-CM

## 2013-07-11 MED ORDER — DOXYCYCLINE HYCLATE 100 MG PO TABS: 100.0000 mg | ORAL_TABLET | Freq: Two times a day (BID) | ORAL | Status: DC

## 2013-07-11 NOTE — Progress Notes (Addendum)
[facility] [address], [place] [address] [phone]  Date:  07/11/2013   Name:  [name]   DOB:  Feb 04, 1946   MRN:  [id number]  PCP:  [name], MD    Chief Complaint: Rash, Insect Bite and Ankle Pain   History of Present Illness:  [name] is a 67 y.o. very pleasant female patient who presents with the following:  Patient with a number of issues.  She was bitten by a tick on 7/4 and is concerned that she has "STARI".  She is experiencing no rash, fever, chills, myalgia, arthralgia, headache or other complaints referable to a tick bite.   She is scheduled for reconstructive surgery on her left ankle.  She tripped over a vacuum cleaner in the middle of the floor two days ago and is concerned that her "tendon may have pulled loose".   She had pain but it is resolved and she now has no pain, swelling, ecchymosis, tenderness or limitation of ROM.  She has no other complaints.  Denies other complaint or health concern today.   Patient Active Problem List   Diagnosis Date Noted  . ANEMIA 08/22/2010  . DEPRESSION 08/22/2010  . HIATAL HERNIA 08/22/2010  . DERMATITIS 08/22/2010  . CHEST PAIN 08/22/2010  . HYPERGLYCEMIA 08/22/2010  . BIPOLAR AFFECTIVE DISORDER, HX OF 08/22/2010  . NEOPLASM, MALIGNANT, BREAST 09/16/2008  . HYPERLIPIDEMIA 09/16/2008  . ANXIETY DEPRESSION 09/16/2008  . 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 COUGH 09/16/2008  . HX OF GALLSTONE 09/16/2008    Past Medical History  Diagnosis Date  . Colitis   . Cancer   . Hyperlipidemia   . Allergy   . Anemia     Past Surgical History  Procedure Laterality Date  . Appendectomy    . Breast surgery    . Cholecystectomy      History  Substance Use Topics  . Smoking status: Former Games developer  . Smokeless tobacco: Not on file  . Alcohol Use: No     Comment: AA since 1989    Family History  Problem Relation Age of Onset  . Cancer Mother   . Cancer Father   .  Heart disease Father   . Heart disease Brother   . Heart disease Brother     Allergies  Allergen Reactions  . Sulfa Drugs Cross Reactors     Medication list has been reviewed and updated.  Current Outpatient Prescriptions on File Prior to Visit  Medication Sig Dispense Refill  . buPROPion (WELLBUTRIN XL) 300 MG 24 hr tablet Take 300 mg by mouth daily.        . cetirizine (ZYRTEC) 10 MG tablet Take 10 mg by mouth daily.        Marland Kitchen escitalopram (LEXAPRO) 20 MG tablet Take 20 mg by mouth daily.        . lansoprazole (PREVACID) 15 MG capsule Take 15 mg by mouth daily.        . simvastatin (ZOCOR) 20 MG tablet Take 20 mg by mouth at bedtime.        Marland Kitchen esomeprazole (NEXIUM) 40 MG capsule Take 40 mg by mouth daily before breakfast.       No current facility-administered medications on file prior to visit.    Review of Systems:  As per HPI, otherwise negative.    Physical Examination: Filed Vitals:   07/11/13 0811  BP: 122/76  Pulse: 93  Temp: 98.2 F (36.8 C)  Resp: 16  Filed Vitals:   07/11/13 0811  Height: 5\' 5"  (1.651 m)  Weight: 150 lb 6.4 oz (68.221 kg)   Body mass index is 25.03 kg/(m^2). Ideal Body Weight: Weight in (lb) to have BMI = 25: 149.9   GEN: WDWN, NAD, Non-toxic, Alert & Oriented x 3 HEENT: Atraumatic, Normocephalic.  Ears and Nose: No external deformity. EXTR: No clubbing/cyanosis/edema NEURO: Normal gait.  PSYCH: Normally interactive. Conversant. Not depressed or anxious appearing.  Calm demeanor.  SKIN:  No rash.   LEFT ankle.  Not tender.  No bruise, swelling, full ROM active and passive  Assessment and Plan: Contusion ankle Tick bite   Signed,  Phillips Odor, MD   07/11/13 4:56 pm  Patient was seen here earlier by Dr. Dareen Piano and wants to be rechecked, was not satisfied with what she was told after she went home and looked at the [facility] website/internet.   She is afraid that she has tick borne illnesses and wants to be treated.  She  had tick bite on 06/26/13,  Was in yard, was on her left mid abd x 5 hrs, pulled out tick after she got dressed She has had HA x 1 episode, chronic jt pain that is not diff than her arthritis, she also has rash a"red spots" on her body that is "never there on her body", I see the spots she is referring to, we have taken pictures, they are not target lesions Was advised that she can get Lyme, RMSF, Ehrlichia titers in 2 weeks if she wants, I would rather wait a little longer before we ger titers if she desires.  We will give her treatment for tick exposure, she has taken Doxycyline  before without problems Pictures of areas of her concern were taken Rx Doxycyline 100 mg BID x 10 Days Gross sideeffects, risk and benefits, and alternatives of medications d/w patient. Patient is aware that all medications have potential sideeffects and we are unable to predict every sideeffect or drug-drug interaction that may occur. She states she will lodge complaints against our practice, She was given info with Colleen/Dr Smith's names if this is what she wants to pursue.  Thao P Le DO This was addended on 07/11/2013  Signed 07/13/2013

## 2013-07-11 NOTE — Addendum Note (Signed)
Addended by: Lenell Antu on: 07/11/2013 05:08 PM   Modules accepted: Orders

## 2013-07-14 DIAGNOSIS — M25579 Pain in unspecified ankle and joints of unspecified foot: Secondary | ICD-10-CM | POA: Diagnosis not present

## 2013-07-17 DIAGNOSIS — H02849 Edema of unspecified eye, unspecified eyelid: Secondary | ICD-10-CM | POA: Diagnosis not present

## 2013-07-21 ENCOUNTER — Telehealth: Payer: Self-pay | Admitting: *Deleted

## 2013-07-21 ENCOUNTER — Ambulatory Visit: Payer: Medicare Other | Admitting: Psychiatry

## 2013-07-21 NOTE — Telephone Encounter (Signed)
Left message for a return phone call to schedule her with a new provider.  Awaiting patient response. 

## 2013-07-23 ENCOUNTER — Ambulatory Visit: Payer: Medicare Other

## 2013-07-23 ENCOUNTER — Ambulatory Visit (INDEPENDENT_AMBULATORY_CARE_PROVIDER_SITE_OTHER): Payer: Medicare Other | Admitting: Family Medicine

## 2013-07-23 VITALS — BP 132/82 | HR 94 | Temp 98.8°F | Resp 17 | Ht 65.5 in | Wt 152.0 lb

## 2013-07-23 DIAGNOSIS — R52 Pain, unspecified: Secondary | ICD-10-CM

## 2013-07-23 DIAGNOSIS — R05 Cough: Secondary | ICD-10-CM

## 2013-07-23 DIAGNOSIS — R509 Fever, unspecified: Secondary | ICD-10-CM | POA: Diagnosis not present

## 2013-07-23 DIAGNOSIS — R300 Dysuria: Secondary | ICD-10-CM

## 2013-07-23 DIAGNOSIS — T148 Other injury of unspecified body region: Secondary | ICD-10-CM

## 2013-07-23 LAB — POCT CBC
Granulocyte percent: 55 %G (ref 37–80)
Lymph, poc: 2.3 (ref 0.6–3.4)
MCHC: 32 g/dL (ref 31.8–35.4)
MPV: 6.9 fL (ref 0–99.8)
POC Granulocyte: 3.2 (ref 2–6.9)
POC LYMPH PERCENT: 38.4 %L (ref 10–50)
POC MID %: 6.6 %M (ref 0–12)
Platelet Count, POC: 254 10*3/uL (ref 142–424)
RDW, POC: 13.2 %

## 2013-07-23 LAB — POCT UA - MICROSCOPIC ONLY
Bacteria, U Microscopic: NEGATIVE
Casts, Ur, LPF, POC: NEGATIVE
Crystals, Ur, HPF, POC: NEGATIVE
Yeast, UA: NEGATIVE

## 2013-07-23 LAB — POCT SEDIMENTATION RATE: POCT SED RATE: 16 mm/hr (ref 0–22)

## 2013-07-23 LAB — POCT URINALYSIS DIPSTICK
Blood, UA: NEGATIVE
Nitrite, UA: POSITIVE
Protein, UA: NEGATIVE
Urobilinogen, UA: 0.2
pH, UA: 5.5

## 2013-07-23 NOTE — Progress Notes (Signed)
Subjective:    Patient ID: [name], female    DOB: 1946-02-08, 67 y.o.   MRN: [id number]  HPI [name] is a 67 y.o. female PCP: Dr. Prince Rome - currently on vacation.   Seen 07/11/13 by Dr. Dareen Piano then Dr. Conley Rolls. See notes regarding that office visit.   Per prior notes: She had tick bite on 06/26/13, Was in yard, was on her left mid abd x 5 hrs, pulled out tick after she got dressed, noted she had HA x 1 episode, chronic jt pain that was not different than her arthritis, pictures of rash obtained not felt to be target lesions. Plan on Lyme, RMSF, Ehrlichia titers in 2 weeks if desired, and Rx Doxycyline 100 mg BID x 10 Days given was not taken - took 2 doses, had nausea/vomiting with 2nd dose.  Took Zofran before both doses and still sick on stomach,  So no further doses of doxycycline. Clarified above hx and verified as same. Rash resolved.   Upper teeth removed Wednesday last week (5 teeth extracted). Immediate dentures placed.  2 days later - redness/bruising below R eye - seen by on call dentist.  Adjusted dentures and bite, started on Amoxicillin then. Unknown if infection seen. Seen again 2 days ago - d/t pain in mouth, and pain with dentures.   She is concerned may have infection in mouth. Feels fatigued, bodyaches "sick all over".  Min cough - no change from prior. Hx of ARDS with PNA in 2008.   Normal temp 97.6, home temps have been 99.8 to 100 past 4-5 days.   Bodyaches for past 5 days - in back. Trouble functioning "feels sick all over". Hx of fibromyalgia - mild - usually affects her once per month. No new medications.   Has had a urinary infection for 6 months - s/p treatment but still feels like infected. Did drug store test for urine in past.  No recent change in urine symptoms.   Tx: motrin and tylenol, pyridium once every night (past 6 months)  Of note  - did have cortisone injection in L ankle 10 days ago.    Review of Systems  HENT: Negative for neck pain and neck  stiffness.   Eyes: Negative for photophobia and visual disturbance.  Respiratory: Positive for cough (chronic cough - no recent changes. ).   Gastrointestinal: Positive for constipation. Negative for abdominal pain.  Genitourinary: Positive for difficulty urinating (hesitancy. cloudy  urine for 6 months. ). Negative for dysuria, urgency, frequency and hematuria.  Musculoskeletal: Positive for myalgias and arthralgias.  Skin: Negative for rash (no new rash. ).  Neurological: Negative for headaches.       Objective:   Physical Exam  Vitals reviewed. Constitutional: She is oriented to person, place, and time. She appears well-developed and well-nourished. No distress.  HENT:  Head: Normocephalic and atraumatic.  Right Ear: Hearing, tympanic membrane, external ear and ear canal normal.  Left Ear: Hearing, tympanic membrane, external ear and ear canal normal.  Nose: Nose normal.  Mouth/Throat: Uvula is midline, oropharynx is clear and moist and mucous membranes are normal. No dental abscesses. No oropharyngeal exudate.    Eyes: Conjunctivae and EOM are normal. Pupils are equal, round, and reactive to light.  Cardiovascular: Normal rate, regular rhythm, normal heart sounds and intact distal pulses.   No murmur heard. Pulmonary/Chest: Effort normal and breath sounds normal. No respiratory distress. She has no wheezes. She has no rhonchi.  Abdominal: Soft. Bowel sounds are normal. There is  no tenderness. There is no CVA tenderness.  Neurological: She is alert and oriented to person, place, and time.  Skin: Skin is warm and dry. No rash noted.  Psychiatric: She has a normal mood and affect. Her behavior is normal.    Results for orders placed in visit on 07/23/13  POCT CBC      Result Value Range   WBC 5.9  4.6 - 10.2 K/uL   Lymph, poc 2.3  0.6 - 3.4   POC LYMPH PERCENT 38.4  10 - 50 %L   MID (cbc) 0.4  0 - 0.9   POC MID % 6.6  0 - 12 %M   POC Granulocyte 3.2  2 - 6.9   Granulocyte  percent 55.0  37 - 80 %G   RBC 4.30  4.04 - 5.48 M/uL   Hemoglobin 12.9  12.2 - 16.2 g/dL   HCT, POC 40.9  81.1 - 47.9 %   MCV 93.8  80 - 97 fL   MCH, POC 30.0  27 - 31.2 pg   MCHC 32.0  31.8 - 35.4 g/dL   RDW, POC 91.4     Platelet Count, POC 254  142 - 424 K/uL   MPV 6.9  0 - 99.8 fL  POCT SEDIMENTATION RATE      Result Value Range   POCT SED RATE 16  0 - 22 mm/hr  POCT URINALYSIS DIPSTICK      Result Value Range   Color, UA orange     Clarity, UA clear     Glucose, UA neg     Bilirubin, UA neg     Ketones, UA trace     Spec Grav, UA >=1.030     Blood, UA neg     pH, UA 5.5     Protein, UA neg     Urobilinogen, UA 0.2     Nitrite, UA positive     Leukocytes, UA Negative    POCT UA - MICROSCOPIC ONLY      Result Value Range   WBC, Ur, HPF, POC 0-2     RBC, urine, microscopic 0-1     Bacteria, U Microscopic neg     Mucus, UA neg     Epithelial cells, urine per micros 0-2     Crystals, Ur, HPF, POC neg     Casts, Ur, LPF, POC neg     Yeast, UA neg      [facility] reading (PRIMARY) by  Dr. Neva Seat: CXR: NAD.Marland Kitchen    Assessment & Plan:  [name] is a 67 y.o. female Fever, unspecified - Body aches. Plan: POCT CBC, POCT SEDIMENTATION RATE, POCT urinalysis dipstick, POCT UA - Microscopic Only, [place] spotted fvr ab, IgM-blood, Urine culture, B. burgdorfi antibodies, Ehrlichia antibody panel, DG Chest 2 View. Reassuring CBC, ESR, and CXR.  Tick borne illness tests above, but reported tick exposure earlier last month was only attached for few hours, and intolerant to doxycycline.  possible viral illness.  Already on amoxicillin for recent dental procedure.  No apparent periodontal abscess, but rtc precautions, and if fever returns - rtc.    Dysuria. hesitancy - no apparent infection on U/a, will check cx, then follow up with PCP  Cough - remote hx ARDS.  Reassuring exam, CBC, CXR. rtc precautions.   Patient Instructions  Check your temperature if you feel febrile. If over 100-  return to clinic. Continue antibiotic as prescribed by your dentist.  If any worsening mouth pain or swelling -  call your dentist. You should receive a call or letter about your lab results within the next week to 10 days. Return to the clinic or go to the nearest emergency room if any of your symptoms worsen or new symptoms occur. Ok to use tylenol or motrin for now if needed for body aches.

## 2013-07-23 NOTE — Patient Instructions (Signed)
Check your temperature if you feel febrile. If over 100- return to clinic. Continue antibiotic as prescribed by your dentist.  If any worsening mouth pain or swelling - call your dentist. You should receive a call or letter about your lab results within the next week to 10 days. Return to the clinic or go to the nearest emergency room if any of your symptoms worsen or new symptoms occur. Ok to use tylenol or motrin for now if needed for body aches.

## 2013-07-25 LAB — URINE CULTURE: Colony Count: NO GROWTH

## 2013-07-27 ENCOUNTER — Telehealth: Payer: Self-pay

## 2013-07-27 NOTE — Telephone Encounter (Signed)
Pt is calling to get lab results.

## 2013-07-28 NOTE — Telephone Encounter (Signed)
Spoke with pt, advised lab results. Pt understood. 

## 2013-07-28 NOTE — Telephone Encounter (Signed)
LMOM to CB. 

## 2013-07-29 DIAGNOSIS — R059 Cough, unspecified: Secondary | ICD-10-CM | POA: Diagnosis not present

## 2013-07-29 DIAGNOSIS — R05 Cough: Secondary | ICD-10-CM | POA: Diagnosis not present

## 2013-07-29 LAB — EHRLICHIA ANTIBODY PANEL: E chaffeensis (HGE) Ab, IgG: <1:64 {titer}

## 2013-08-04 ENCOUNTER — Ambulatory Visit: Payer: Medicare Other | Admitting: Psychiatry

## 2013-08-18 ENCOUNTER — Ambulatory Visit: Payer: Medicare Other | Admitting: Psychiatry

## 2013-08-26 DIAGNOSIS — Z23 Encounter for immunization: Secondary | ICD-10-CM | POA: Diagnosis not present

## 2013-09-01 ENCOUNTER — Ambulatory Visit: Payer: Medicare Other | Admitting: Psychiatry

## 2013-09-15 ENCOUNTER — Ambulatory Visit: Payer: Medicare Other | Admitting: Psychiatry

## 2013-09-15 DIAGNOSIS — M25579 Pain in unspecified ankle and joints of unspecified foot: Secondary | ICD-10-CM | POA: Diagnosis not present

## 2013-09-22 ENCOUNTER — Ambulatory Visit: Payer: Medicare Other

## 2013-09-22 ENCOUNTER — Ambulatory Visit (INDEPENDENT_AMBULATORY_CARE_PROVIDER_SITE_OTHER): Payer: Medicare Other | Admitting: Family Medicine

## 2013-09-22 VITALS — BP 121/81 | HR 77 | Temp 98.2°F | Resp 18 | Wt 152.0 lb

## 2013-09-22 DIAGNOSIS — S8010XA Contusion of unspecified lower leg, initial encounter: Secondary | ICD-10-CM

## 2013-09-22 DIAGNOSIS — S8012XA Contusion of left lower leg, initial encounter: Secondary | ICD-10-CM

## 2013-09-22 LAB — POCT CBC
HCT, POC: 38.7 % (ref 37.7–47.9)
Lymph, poc: 1.4 (ref 0.6–3.4)
MCHC: 30.5 g/dL — AB (ref 31.8–35.4)
MCV: 97 fL (ref 80–97)
POC Granulocyte: 2.5 (ref 2–6.9)
POC LYMPH PERCENT: 34.1 %L (ref 10–50)
RDW, POC: 14.3 %

## 2013-09-22 NOTE — Progress Notes (Signed)
Subjective:    Patient ID: [name], female    DOB: [birth year], 67 y.o.   MRN: [id number] Chief Complaint  Patient presents with  . Fall    injured left leg    HPI  Larey Seat onto suitcase Thursday morning.  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 Since the fall she has been having more left knee and left hip pain.  She is having a large amount of swelling and tenderness - about the size of a baseball - over her lateral mid-shin.  It has turned a little blue but not as many colors as she expected. She is really worried that she injured her bone.  H/o cancer H/o narcotic addiction  Past Medical History  Diagnosis Date  . Colitis   . Cancer   . Hyperlipidemia   . Allergy   . Anemia   . Depression   . Anxiety    Current Outpatient Prescriptions on File Prior to Visit  Medication Sig Dispense Refill  . buPROPion (WELLBUTRIN XL) 300 MG 24 hr tablet Take 300 mg by mouth daily.        . cetirizine (ZYRTEC) 10 MG tablet Take 10 mg by mouth daily.        Marland Kitchen escitalopram (LEXAPRO) 20 MG tablet Take 20 mg by mouth daily.        Marland Kitchen esomeprazole (NEXIUM) 40 MG capsule Take 40 mg by mouth daily before breakfast.      . lansoprazole (PREVACID) 15 MG capsule Take 15 mg by mouth daily.        . simvastatin (ZOCOR) 20 MG tablet Take 20 mg by mouth at bedtime.         No current facility-administered medications on file prior to visit.   Allergies  Allergen Reactions  . Sulfa Drugs Cross Reactors     Review of Systems  Constitutional: Positive for activity change. Negative for fever, chills and unexpected weight change.  Musculoskeletal: Positive for myalgias and arthralgias. Negative for joint swelling and gait problem.  Skin: Positive for color change. Negative for rash.  Neurological: Negative for weakness and numbness.      BP 121/81  Pulse 77  Temp(Src) 98.2 F (36.8 C) (Oral)  Resp 18  Wt 152 lb (68.947 kg)  BMI 24.9 kg/m2 Objective:   Physical Exam  Constitutional: She  is oriented to person, place, and time. She appears well-developed and well-nourished. No distress.  HENT:  Head: Normocephalic and atraumatic.  Right Ear: External ear normal.  Eyes: Conjunctivae are normal. No scleral icterus.  Pulmonary/Chest: Effort normal.  Musculoskeletal:       Left knee: She exhibits normal range of motion, no swelling, no effusion and normal patellar mobility. No tenderness found.       Left lower leg: She exhibits tenderness, bony tenderness and swelling. She exhibits no edema.       Legs: Large baseball sized hematoma over lateral anterior left shin with minimal blue-green discoloration. No bony instability or pain palpable. Minimal crepitus in left knee.  Neurological: She is alert and oriented to person, place, and time.  Skin: Skin is warm and dry. She is not diaphoretic. No erythema.  Psychiatric: She has a normal mood and affect. Her behavior is normal.     [facility] reading (PRIMARY) by  Dr. Clelia Croft. Left tib/fib: normal, soft tissue swelling.  Results for orders placed in visit on 09/22/13  POCT CBC      Result Value Range   WBC 4.2 (*) 4.6 -  10.2 K/uL   Lymph, poc 1.4  0.6 - 3.4   POC LYMPH PERCENT 34.1  10 - 50 %L   MID (cbc) 0.3  0 - 0.9   POC MID % 6.3  0 - 12 %M   POC Granulocyte 2.5  2 - 6.9   Granulocyte percent 59.6  37 - 80 %G   RBC 3.99 (*) 4.04 - 5.48 M/uL   Hemoglobin 11.8 (*) 12.2 - 16.2 g/dL   HCT, POC 81.1  91.4 - 47.9 %   MCV 97.0  80 - 97 fL   MCH, POC 29.6  27 - 31.2 pg   MCHC 30.5 (*) 31.8 - 35.4 g/dL   RDW, POC 78.2     Platelet Count, POC 219  142 - 424 K/uL   MPV 7.1  0 - 99.8 fL    Assessment & Plan:  Hematoma of leg, left, initial encounter - Plan: DG Tibia/Fibula Left, POCT CBC Rec heat 3-4x/d, elevate, increase activity level gradually. If pain or swelling worsens, RTC for further eval and can cons [place].

## 2013-09-22 NOTE — Patient Instructions (Addendum)
Hematoma A hematoma is a pocket of blood that collects under the skin, in an organ, in a body space, in a joint space, or in other tissue. The blood can clot to form a lump that you can see and feel. The lump is often firm, sore, and sometimes even painful and tender. Most hematomas get better in a few days to weeks. However, some hematomas may be serious and require medical care.Hematomas can range in size from very small to very large. CAUSES  A hematoma can be caused by a blunt or penetrating injury. It can also be caused by leakage from a blood vessel under the skin. Spontaneous leakage from a blood vessel is more likely to occur in elderly people, especially those taking blood thinners. Sometimes, a hematoma can develop after certain medical procedures. SYMPTOMS  Unlike a bruise, a hematoma forms a firm lump that you can feel. This lump is the collection of blood. The collection of blood can also cause your skin to turn a blue to dark blue color. If the hematoma is close to the surface of the skin, it often produces a yellowish color in the skin. DIAGNOSIS  Your caregiver can determine whether you have a hematoma based on your history and a physical exam. TREATMENT  Hematomas usually go away on their own over time. Rarely does the blood need to be drained out of the body. HOME CARE INSTRUCTIONS   Put ice on the injured area.  Put ice in a plastic bag.  Place a towel between your skin and the bag.  Leave the ice on for 15-20 minutes, 3-4 times a day for the first 1 to 2 days.  After the first 2 days, switch to using warm compresses on the hematoma.  Elevate the injured area to help decrease pain and swelling. Wrapping the area with an elastic bandage may also be helpful. Compression helps to reduce swelling and promotes shrinking of the hematoma. Make sure the bandage is not wrapped too tight.  If your hematoma is on a lower extremity and is painful, crutches may be helpful for a couple  days.  Only take over-the-counter or prescription medicines for pain, discomfort, or fever as directed by your caregiver. Most patients can take acetaminophen or ibuprofen for the pain. SEEK IMMEDIATE MEDICAL CARE IF:   You have increasing pain, or your pain is not controlled with medicine.  You have a fever.  You have worsening swelling or discoloration.  Your skin over the hematoma breaks or starts bleeding. MAKE SURE YOU:   Understand these instructions.  Will watch your condition.  Will get help right away if you are not doing well or get worse. Document Released: 07/24/2004 Document Revised: 03/03/2012 Document Reviewed: 08/13/2011 [facility] Patient Information 2014 [place], [place]. Bone Bruise  A bone bruise is a small hidden fracture of the bone. It typically occurs with bones located close to the surface of the skin.  SYMPTOMS  The pain lasts longer than a normal bruise.  The bruised area is difficult to use.  There may be discoloration or swelling of the bruised area.  When a bone bruise is found with injury to the anterior cruciate ligament (in the knee) there is often an increased:  Amount of fluid in the knee  Time the fluid in the knee lasts.  Number of days until you are walking normally and regaining the motion you had before the injury.  Number of days with pain from the injury. DIAGNOSIS  It can  only be seen on X-rays known as MRIs. This stands for magnetic resonance imaging. A regular X-ray taken of a bone bruise would appear to be normal. A bone bruise is a common injury in the knee and the heel bone (calcaneus). The problems are similar to those produced by stress fractures, which are bone injuries caused by overuse. A bone bruise may also be a sign of other injuries. For example, bone bruises are commonly found where an anterior cruciate ligament (ACL) in the knee has been pulled away from the bone (ruptured). A ligament is a tough fibrous material that  connects bones together to make our joints stable. Bruises of the bone last a lot longer than bruises of the muscle or tissues beneath the skin. Bone bruises can last from days to months and are often more severe and painful than other bruises. TREATMENT Because bone bruises are sudden injuries you cannot often prevent them, other than by being extremely careful. Some things you can do to improve the condition are:  Apply ice to the sore area for 15-20 minutes, 3-4 times per day while awake for the first 2 days. Put the ice in a plastic bag, and place a towel between the bag of ice and your skin.  Keep your bruised area raised (elevated) when possible to lessen swelling.  For activity:  Use crutches when necessary; do not put weight on the injured leg until you are no longer tender.  You may walk on your affected part as the pain allows, or as instructed.  Start weight bearing gradually on the bruised part.  Continue to use crutches or a cane until you can stand without causing pain, or as instructed.  If a plaster splint was applied, wear the splint until you are seen for a follow-up examination. Rest it on nothing harder than a pillow the first 24 hours. Do not put weight on it. Do not get it wet. You may take it off to take a shower or bath.  If an air splint was applied, more air may be blown into or out of the splint as needed for comfort. You may take it off at night and to take a shower or bath.  Wiggle your toes in the splint several times per day if you are able.  You may have been given an elastic bandage to use with the plaster splint or alone. The splint is too tight if you have numbness, tingling or if your foot becomes cold and blue. Adjust the bandage to make it comfortable.  Only take over-the-counter or prescription medicines for pain, discomfort, or fever as directed by your caregiver.  Follow all instructions for follow up with your caregiver. This includes any  orthopedic referrals, physical therapy, and rehabilitation. Any delay in obtaining necessary care could result in a delay or failure of the bones to heal. SEEK MEDICAL CARE IF:   You have an increase in bruising, swelling, or pain.  You notice coldness of your toes.  You do not get pain relief with medications. SEEK IMMEDIATE MEDICAL CARE IF:   Your toes are numb or blue.  You have severe pain not controlled with medications.  If any of the problems that caused you to seek care are becoming worse. Document Released: 03/01/2004 Document Revised: 03/03/2012 Document Reviewed: 07/14/2008 [facility] Patient Information 2014 [place], [place].

## 2013-09-30 DIAGNOSIS — M25579 Pain in unspecified ankle and joints of unspecified foot: Secondary | ICD-10-CM | POA: Diagnosis not present

## 2013-10-16 DIAGNOSIS — M25569 Pain in unspecified knee: Secondary | ICD-10-CM | POA: Diagnosis not present

## 2013-10-16 DIAGNOSIS — M76829 Posterior tibial tendinitis, unspecified leg: Secondary | ICD-10-CM | POA: Diagnosis not present

## 2013-10-27 DIAGNOSIS — H47329 Drusen of optic disc, unspecified eye: Secondary | ICD-10-CM | POA: Diagnosis not present

## 2013-11-03 DIAGNOSIS — H2589 Other age-related cataract: Secondary | ICD-10-CM | POA: Diagnosis not present

## 2013-11-03 DIAGNOSIS — H47329 Drusen of optic disc, unspecified eye: Secondary | ICD-10-CM | POA: Diagnosis not present

## 2013-11-06 ENCOUNTER — Ambulatory Visit (INDEPENDENT_AMBULATORY_CARE_PROVIDER_SITE_OTHER): Payer: Medicare Other | Admitting: Family Medicine

## 2013-11-06 ENCOUNTER — Ambulatory Visit: Payer: Medicare Other

## 2013-11-06 VITALS — BP 124/62 | HR 70 | Temp 99.6°F | Resp 18 | Ht 65.5 in | Wt 153.0 lb

## 2013-11-06 DIAGNOSIS — R05 Cough: Secondary | ICD-10-CM | POA: Diagnosis not present

## 2013-11-06 DIAGNOSIS — J329 Chronic sinusitis, unspecified: Secondary | ICD-10-CM

## 2013-11-06 DIAGNOSIS — R51 Headache: Secondary | ICD-10-CM

## 2013-11-06 DIAGNOSIS — IMO0001 Reserved for inherently not codable concepts without codable children: Secondary | ICD-10-CM

## 2013-11-06 DIAGNOSIS — M791 Myalgia, unspecified site: Secondary | ICD-10-CM

## 2013-11-06 LAB — POCT CBC
Hemoglobin: 11.9 g/dL — AB (ref 12.2–16.2)
MPV: 7 fL (ref 0–99.8)
POC Granulocyte: 2.2 (ref 2–6.9)
POC MID %: 6.2 %M (ref 0–12)
RBC: 4.1 M/uL (ref 4.04–5.48)
WBC: 3.9 10*3/uL — AB (ref 4.6–10.2)

## 2013-11-06 LAB — POCT INFLUENZA A/B: Influenza A, POC: NEGATIVE

## 2013-11-06 MED ORDER — AZITHROMYCIN 250 MG PO TABS: ORAL_TABLET | ORAL | Status: DC

## 2013-11-06 MED ORDER — TRAMADOL HCL 50 MG PO TABS: 50.0000 mg | ORAL_TABLET | Freq: Four times a day (QID) | ORAL | Status: DC | PRN

## 2013-11-06 NOTE — Patient Instructions (Addendum)
Saline nasal spray atleast 4 times per day for sinus congestion, over the counter mucinex or mucinex DM if needed for cough. Tylenol and motrin as prescribed in past for fibromyalgia, but if needed for more pain in the short term - ultram up to every 6 hours. Drink plenty of fluids, and rest as able.  Your lab tests are reassuring and this may be from a virus, but zpak was prescribed as discussed. But if your sinuses are not starting to improve in the next 1 week, may need to change to different antibiotic.  Return to the clinic or go to the nearest emergency room if any of your symptoms worsen or new symptoms occur.

## 2013-11-06 NOTE — Progress Notes (Addendum)
Subjective:    Patient ID: [name], female    DOB: 1946-09-08, 67 y.o.   MRN: [id number] This chart was scribed for Lori Staggers, MD by Clydene Laming, ED Scribe. This patient was seen in room 10 and the patient's care was started at 8:27 AM. HPI HPI Comments: [name] is a 67 y.o. female who presents to the [facility] complaining of sinus congestion with associated  H/a, sore throat, and back pain onset one week ago. Pt has had a sick contact with bronchitis. Pt has a hx of pneumonia and acute respiratory disease. Pt has been "bathroom steaming" and taking Tylenol and prescribed Motrin every 3 hours all week long. She has taken  z packs  In past that she has tolerated as has had history of colitis. Her last pcp  [name], MD has moved to [place] and she is currently looking for a new doctor. Pt has a hx of fibromyalgia, worsened when ill. She states that every day her body is aching.  Subjective fever - usually in 97 range.  Had flu shot in September.    Patient Active Problem List   Diagnosis Date Noted  . ANEMIA 08/22/2010  . DEPRESSION 08/22/2010  . HIATAL HERNIA 08/22/2010  . DERMATITIS 08/22/2010  . CHEST PAIN 08/22/2010  . HYPERGLYCEMIA 08/22/2010  . BIPOLAR AFFECTIVE DISORDER, HX OF 08/22/2010  . NEOPLASM, MALIGNANT, BREAST 09/16/2008  . HYPERLIPIDEMIA 09/16/2008  . ANXIETY DEPRESSION 09/16/2008  . 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 COUGH 09/16/2008  . HX OF GALLSTONE 09/16/2008   Past Medical History  Diagnosis Date  . Colitis   . Cancer   . Hyperlipidemia   . Allergy   . Anemia   . Depression   . Anxiety    Past Surgical History  Procedure Laterality Date  . Appendectomy    . Breast surgery    . Cholecystectomy     Allergies  Allergen Reactions  . Sulfa Drugs Cross Reactors    Prior to Admission medications   Medication Sig Start Date End Date Taking? Authorizing Provider  buPROPion (WELLBUTRIN XL) 300 MG 24 hr  tablet Take 300 mg by mouth daily.     Yes Historical Provider, MD  cetirizine (ZYRTEC) 10 MG tablet Take 10 mg by mouth daily.     Yes Historical Provider, MD  escitalopram (LEXAPRO) 20 MG tablet Take 20 mg by mouth daily.     Yes Historical Provider, MD  esomeprazole (NEXIUM) 40 MG capsule Take 40 mg by mouth daily before breakfast.   Yes Historical Provider, MD  lansoprazole (PREVACID) 15 MG capsule Take 15 mg by mouth daily.     Yes Historical Provider, MD  simvastatin (ZOCOR) 20 MG tablet Take 20 mg by mouth at bedtime.     Yes Historical Provider, MD   History   Social History  . Marital Status: Divorced    Spouse Name: N/A    Number of Children: N/A  . Years of Education: N/A   Occupational History  . Not on file.   Social History Main Topics  . Smoking status: Former Games developer  . Smokeless tobacco: Not on file  . Alcohol Use: No     Comment: AA since 1989  . Drug Use: Yes     Comment: stopped 1989  . Sexual Activity: Not on file   Other Topics Concern  . Not on file   Social History Narrative  . No narrative on file  Review of Systems  Constitutional: Positive for fever.  HENT: Positive for congestion, ear pain and sore throat.   Musculoskeletal: Positive for back pain and myalgias.  Neurological: Positive for headaches.       Objective:   Physical Exam  Vitals reviewed. Constitutional: She is oriented to person, place, and time. She appears well-developed and well-nourished. No distress.  HENT:  Head: Normocephalic and atraumatic.  Right Ear: Hearing, tympanic membrane, external ear and ear canal normal.  Left Ear: Hearing, tympanic membrane, external ear and ear canal normal.  Nose: Nose normal.  Mouth/Throat: Oropharynx is clear and moist. No oropharyngeal exudate.  Diffused tenderness underneath the jaw line, no discrete lymphadenopathy   Eyes: Conjunctivae and EOM are normal. Pupils are equal, round, and reactive to light.  Cardiovascular: Normal  rate, regular rhythm, normal heart sounds and intact distal pulses.   No murmur heard. Pulmonary/Chest: Effort normal and breath sounds normal. No respiratory distress. She has no wheezes. She has no rhonchi. She has no rales.  Musculoskeletal:  Upper back diffuse tenderness - to rhomboids and lower trapezius.   Neurological: She is alert and oriented to person, place, and time.  Skin: Skin is warm and dry. No rash noted.  Psychiatric: She has a normal mood and affect. Her behavior is normal.    Results for orders placed in visit on 11/06/13  POCT CBC      Result Value Range   WBC 3.9 (*) 4.6 - 10.2 K/uL   Lymph, poc 1.5  0.6 - 3.4   POC LYMPH PERCENT 37.8  10 - 50 %L   MID (cbc) 0.2  0 - 0.9   POC MID % 6.2  0 - 12 %M   POC Granulocyte 2.2  2 - 6.9   Granulocyte percent 56.0  37 - 80 %G   RBC 4.10  4.04 - 5.48 M/uL   Hemoglobin 11.9 (*) 12.2 - 16.2 g/dL   HCT, POC 16.1  09.6 - 47.9 %   MCV 95.9  80 - 97 fL   MCH, POC 29.0  27 - 31.2 pg   MCHC 30.3 (*) 31.8 - 35.4 g/dL   RDW, POC 04.5     Platelet Count, POC 194  142 - 424 K/uL   MPV 7.0  0 - 99.8 fL  POCT INFLUENZA A/B      Result Value Range   Influenza A, POC Negative     Influenza B, POC Negative       [facility] reading (PRIMARY) by  Dr. Neva Seat: CXR: possible increased bronchial markings LLL without discrete infiltrate.      Assessment & Plan:   [name] is a 67 y.o. female Cough - Plan: DG Chest 2 View, POCT CBC, POCT Influenza A/B, azithromycin (ZITHROMAX) 250 MG tabletHeadache(784.0) - Plan: DG Chest 2 View, POCT CBC, POCT Influenza A/B, Sinusitis - Plan: DG Chest 2 View, POCT CBC, POCT Influenza A/B, azithromycin (ZITHROMAX) 250 MG tablet  - viral URI/sinus congestion vs early sinus infection with cough, atypicals possible. Sx care discussed as below.  ZPak requested as tolerated this in past with hx of colitis and GI irritation with other antibiotics, but if not improving in next week, may need different antibiotic  for sinuses. rtc precautions discussed.   Myalgia - Plan: DG Chest 2 View, POCT CBC, POCT Influenza A/B, traMADol (ULTRAM) 50 MG tablet  -hx of fibromyalgia - flair vs myaglias with current illness. #15 ultram rx given for short term, but will need follow  up with PCP for fibromyalgia.     Meds ordered this encounter  Medications  . azithromycin (ZITHROMAX) 250 MG tablet    Sig: Take 2 pills by mouth on day 1, then 1 pill by mouth per day on days 2 through 5.    Dispense:  6 each    Refill:  0  . traMADol (ULTRAM) 50 MG tablet    Sig: Take 1 tablet (50 mg total) by mouth every 6 (six) hours as needed.    Dispense:  15 tablet    Refill:  0   Patient Instructions  Saline nasal spray atleast 4 times per day for sinus congestion, over the counter mucinex or mucinex DM if needed for cough. Tylenol and motrin as prescribed in past for fibromyalgia, but if needed for more pain in the short term - ultram up to every 6 hours. Drink plenty of fluids, and rest as able.  Your lab tests are reassuring and this may be from a virus, but zpak was prescribed as discussed. But if your sinuses are not starting to improve in the next 1 week, may need to change to different antibiotic.  Return to the clinic or go to the nearest emergency room if any of your symptoms worsen or new symptoms occur.          I personally performed the services described in this documentation, which was scribed in my presence. The recorded information has been reviewed and considered, and addended by me as needed.

## 2013-11-09 ENCOUNTER — Telehealth: Payer: Self-pay | Admitting: *Deleted

## 2013-11-09 NOTE — Telephone Encounter (Signed)
Pt called for a new provider since Dr. Donnie Coffin is no longer here and I confirmed 11/11/13 appt w/ pt.

## 2013-11-11 ENCOUNTER — Ambulatory Visit (HOSPITAL_BASED_OUTPATIENT_CLINIC_OR_DEPARTMENT_OTHER): Payer: Medicare Other | Admitting: Family

## 2013-11-11 ENCOUNTER — Other Ambulatory Visit: Payer: Medicare Other | Admitting: Lab

## 2013-11-11 ENCOUNTER — Encounter: Payer: Self-pay | Admitting: *Deleted

## 2013-11-11 ENCOUNTER — Encounter: Payer: Self-pay | Admitting: Family

## 2013-11-11 ENCOUNTER — Telehealth: Payer: Self-pay | Admitting: *Deleted

## 2013-11-11 ENCOUNTER — Ambulatory Visit (HOSPITAL_BASED_OUTPATIENT_CLINIC_OR_DEPARTMENT_OTHER): Payer: Medicare Other | Admitting: Lab

## 2013-11-11 VITALS — BP 145/92 | HR 96 | Temp 99.3°F | Resp 20 | Ht 65.5 in | Wt 151.2 lb

## 2013-11-11 DIAGNOSIS — D649 Anemia, unspecified: Secondary | ICD-10-CM

## 2013-11-11 DIAGNOSIS — Z853 Personal history of malignant neoplasm of breast: Secondary | ICD-10-CM

## 2013-11-11 LAB — CBC WITH DIFFERENTIAL/PLATELET
BASO%: 0.2 % (ref 0.0–2.0)
Basophils Absolute: 0 10*3/uL (ref 0.0–0.1)
EOS%: 1.7 % (ref 0.0–7.0)
Eosinophils Absolute: 0.1 10*3/uL (ref 0.0–0.5)
HCT: 36.5 % (ref 34.8–46.6)
HGB: 12.2 g/dL (ref 11.6–15.9)
LYMPH%: 33.5 % (ref 14.0–49.7)
MCH: 30.2 pg (ref 25.1–34.0)
MCHC: 33.3 g/dL (ref 31.5–36.0)
MCV: 90.5 fL (ref 79.5–101.0)
MONO#: 0.5 10*3/uL (ref 0.1–0.9)
MONO%: 10.4 % (ref 0.0–14.0)
NEUT#: 2.4 10*3/uL (ref 1.5–6.5)
NEUT%: 54.2 % (ref 38.4–76.8)
Platelets: 195 10*3/uL (ref 145–400)
RBC: 4.03 10*6/uL (ref 3.70–5.45)
RDW: 13.1 % (ref 11.2–14.5)
WBC: 4.4 10*3/uL (ref 3.9–10.3)
lymph#: 1.5 10*3/uL (ref 0.9–3.3)

## 2013-11-11 LAB — COMPREHENSIVE METABOLIC PANEL (CC13)
ALT: 11 U/L (ref 0–55)
AST: 19 U/L (ref 5–34)
Albumin: 4 g/dL (ref 3.5–5.0)
Alkaline Phosphatase: 51 U/L (ref 40–150)
Anion Gap: 10 meq/L (ref 3–11)
BUN: 13.5 mg/dL (ref 7.0–26.0)
CO2: 28 meq/L (ref 22–29)
Calcium: 9.3 mg/dL (ref 8.4–10.4)
Chloride: 103 meq/L (ref 98–109)
Creatinine: 0.8 mg/dL (ref 0.6–1.1)
Glucose: 97 mg/dL (ref 70–140)
Potassium: 3.9 meq/L (ref 3.5–5.1)
Sodium: 141 meq/L (ref 136–145)
Total Bilirubin: 0.24 mg/dL (ref 0.20–1.20)
Total Protein: 7.1 g/dL (ref 6.4–8.3)

## 2013-11-11 LAB — FERRITIN CHCC: Ferritin: 32 ng/ml (ref 9–269)

## 2013-11-11 LAB — LACTATE DEHYDROGENASE (CC13): LDH: 227 U/L (ref 125–245)

## 2013-11-11 NOTE — Telephone Encounter (Signed)
appts made and printed...td 

## 2013-11-11 NOTE — Patient Instructions (Signed)
Please contact us at [phone] if you have any questions or concerns.  Please continue to do well and enjoy life!!!  Get plenty of rest, drink plenty of water, exercise daily, eat a balanced diet.  Take calcium 600 mg daily in addition to vitamin D3 1000 IUs daily.   Complete monthly self-breast examinations.  Have a clinical breast exam by a physician every year.  Have your mammogram completed every year.
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
## 2013-11-11 NOTE — Progress Notes (Addendum)
[facility]  [phone] [phone]  OFFICE PROGRESS NOTE   ID: [name]   DOB: 1946/11/27  MR#: [id number]  GNF#:[id number]   PCP: [name], M.D. SU: Lori Ames, MD RAD ONC: Lori Blackbird, MD   HISTORY OF PRESENT ILLNESS: From Dr. Theron Arista [name]'s new patient evaluation note dated 12/25/2006:  "This is a 67 year old [name] from [facility] referred by Dr. Maple [name] for evaluation and treatment of breast cancer. This [name] undergoes routine annual screening mammography.  She actually had some thickening in upper outer quadrant of the right breast and was referred for mammogram.  At the time of initial evaluation in November it was noted that she had a 2 cm nontender mass in the right breast.  Ultrasound of the right breast showed a hypoechoic mass with ill-defined borders measuring 1.9 x 1.5 x 1.5 cm.  A biopsy of the right breast on 11/11/06 showed invasive ductal cancer with associated DCIS, an associated lymph node was negative.  The tumor was ER and PR was positive with 99 and 100% respectively.   Proliferative index was 11%.  HER-2 was 1+.  A BSGI exam on 11/18/06 on the right side showed the mass at 9 o'clock.  There is moderate activity seen on the left breast at about 6 o'clock position.  An MRI scan of both breasts on 11/26/06 showed the enhancing mass at the 9 o'clock position of the right breast.  The left breast did show her to have normal area of enhancement at 10 o'clock.  Core biopsy was recommended.  Second look ultrasound on November 28, 2006 of the left breast showed a 1 cm suspicious enhancing mass anterior to chest wall.  This was located on ultrasound and this was biopsied on 11/28/06 and actually two lesions, one at 6 o'clock and one at 10 o'clock were biopsied, both of which showed fibrocystic changes.  No evidence of malignancy.  Patient was referred to Dr. Francina [name] who took her to the operating room for a lumpectomy with sentinel lymph  node evaluation.  On 12/11/06, final pathology showed a 1.7 cm grade 1 ductal cancer, two sentinel lymph nodes were identified, both of which were negative for malignancy.  Surgical margins were clear.  Lymphovascular invasion was not seen."  Her subsequent history is as detailed below.   INTERVAL HISTORY: Dr. 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  The patient was last seen by Dr. Donnie [name] on 12/11/2012.  Since her last office visit, the patient reports ongoing memory loss, having a sinus infection, she fell one time and hurt her leg, but her biggest concern today is her lack of heat in her home and no ability to pay for an HVAC service call. LCSW [name] spoke to the patient about her living situation and lack of heat with the current freezing weather. 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  She has complaints of occasional nausea for which she takes Zofran and she has a history of colitis which she says is stable currently.  [name] is in the process of changing primary care physicians from Dr. Lavada [name] to Dr. Peri [name].   She is establishing herself with Dr. Darrall [name] service today.  REVIEW OF SYSTEMS: A 10 point review of systems was completed and is negative except as noted above.  [name] denies any other symptomatology  including  fatigue, fever or chills, headache, vision changes, swollen glands, cough or shortness of breath, chest pain or discomfort, vomiting, diarrhea, constipation, change in urinary or bowel habits, arthralgias/myalgias, unusual bleeding/bruising or any other symptomatology.   PAST MEDICAL HISTORY: Past Medical History  Diagnosis Date  . Colitis   . Hyperlipidemia   . Allergy   . Anemia   . Depression   . Anxiety   . Cancer 10/2006    Invasive ductal carcinoma of the right breast   . Pneumonia   . Arthritis   .  Tendon tear   . Bursitis of hip   . 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 Astigmatism   . Cataracts, bilateral   . Gall stones   . Fibromyalgia   . GERD (gastroesophageal reflux disease)   . Endometriosis   . Bursitis   . 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  She has ongoing anxiety-related issues.     PAST SURGICAL HISTORY: Past Surgical History  Procedure Laterality Date  . Appendectomy    . Breast surgery Right 12/11/2006    Lumpectomy  . Carpal tunnel release    . Lumbar fusion      FAMILY HISTORY Family History  Problem Relation Age of Onset  . Cancer Mother   . Heart disease Mother   . Cancer Father     Colon cancer  . Heart disease Father   . Heart disease Brother   . Heart disease Brother   . Vision loss Sister   Father died of heart disease, had colon cancer.  Mother died of heart disease, had lung cancer.  Three siblings alive and well; two brothers and one sister, one in [place], [place] and two brothers in [place], [place].   GYNECOLOGIC HISTORY: She is gravida 1, para 1.  Menarche age 2.  Age of parity 31.   Postmenopausal since the age of 59.  Hormone replacement therapy for 15 years.    SOCIAL HISTORY: She is originally from [place], [place].  She has been divorced since 2000.  She has one adult daughter and 2 grandchildren.  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  In her spare time she enjoys dining out, reading, and attending church services at [employer].  She states her daughter lives in [place], [place] and suffers from anxiety/depression also.      ADVANCED DIRECTIVES:  Not on file   HEALTH MAINTENANCE: History  Substance Use Topics  . Smoking  status: Former Games developer  . Smokeless tobacco: Never Used  . Alcohol Use: No     Comment: AA since 1989    Colonoscopy:  Patient states she had a colonoscopy in 2012 with Dr. Charna Elizabeth PAP:  Not on file Bone density: Bone density scan on 12/06/2006 shows a T score of -1.2 (osteopenia). Lipid panel: Not on file   Allergies  Allergen Reactions  . Sulfa Drugs Cross Reactors     Current Outpatient Prescriptions  Medication Sig Dispense Refill  . azithromycin (ZITHROMAX) 250 MG tablet Take 2 pills by mouth on day 1, then 1 pill by mouth per day on days 2 through 5.  6 each  0  . buPROPion (WELLBUTRIN XL) 300 MG 24 hr tablet Take  300 mg by mouth daily.        . cetirizine (ZYRTEC) 10 MG tablet Take 10 mg by mouth daily.        . cyclobenzaprine (FLEXERIL) 10 MG tablet Take 10 mg by mouth at bedtime.       . diphenoxylate-atropine (LOMOTIL) 2.5-0.025 MG per tablet Take 1 tablet by mouth as needed.       Marland Kitchen escitalopram (LEXAPRO) 20 MG tablet Take 20 mg by mouth daily.       . fluticasone (FLONASE) 50 MCG/ACT nasal spray Place 2 sprays into both nostrils daily.       Marland Kitchen ibuprofen (ADVIL,MOTRIN) 800 MG tablet Take 800 mg by mouth every 8 (eight) hours as needed.       . lansoprazole (PREVACID) 15 MG capsule Take 15 mg by mouth daily.       Marland Kitchen LORazepam (ATIVAN) 0.5 MG tablet Take 1 mg by mouth at bedtime.       . ondansetron (ZOFRAN) 8 MG tablet Take 8 mg by mouth every 8 (eight) hours as needed.       Marland Kitchen PREVIDENT 5000 BOOSTER PLUS 1.1 % PSTE Place 1 each onto teeth 2 (two) times daily.       Marland Kitchen PROAIR HFA 108 (90 BASE) MCG/ACT inhaler Inhale 1 puff into the lungs at bedtime.       . simvastatin (ZOCOR) 20 MG tablet Take 20 mg by mouth at bedtime.        . traMADol (ULTRAM) 50 MG tablet Take 1 tablet (50 mg total) by mouth every 6 (six) hours as needed.  15 tablet  0  . traZODone (DESYREL) 50 MG tablet Take 50 mg by mouth at bedtime.       Marland Kitchen acyclovir (ZOVIRAX) 200 MG capsule Take 200 mg by  mouth 2 (two) times daily.        No current facility-administered medications for this visit.    OBJECTIVE: Filed Vitals:   11/11/13 1314  BP: 145/92  Pulse: 96  Temp: 99.3 F (37.4 C)  Resp: 20     Body mass index is 24.77 kg/(m^2).      ECOG FS: 1 - Symptomatic but completely ambulatory  General appearance: Alert, cooperative, well nourished, mild distress at times/agitation Head: Normocephalic, without obvious abnormality, atraumatic, upper dentures Eyes:  Arcus senilis , PERRLA, EOMI Nose: Nares, septum and mucosa are normal, no drainage or sinus tenderness Neck: No adenopathy, supple, symmetrical, trachea midline, no tenderness Resp: Clear to auscultation bilaterally, no wheezes/rales/rhonchi Cardio: Regular rate and rhythm, S1, S2 normal, no murmur, click, rub or gallop, no edema Breasts:   right breast has a well-healed surgical scar,  glandular breast tissue bilaterally, bilateral axillary fullness GI: Soft, distended, non-tender, hypoactive bowel sounds, no organomegaly Skin: No rashes/lesions, skin warm and dry, no erythematous areas, no cyanosis  M/S:  Atraumatic, normal strength in all extremities, normal range of motion,  clubbed fingertips  Lymph nodes: Cervical, supraclavicular, and axillary nodes normal Neurologic: Grossly normal, cranial nerves II through XII intact, alert and oriented x 3 Psych:  Agitated affect at times and depressive affect at times    LAB RESULTS: Lab Results  Component Value Date   WBC 4.4 11/11/2013   NEUTROABS 2.4 11/11/2013   HGB 12.2 11/11/2013   HCT 36.5 11/11/2013   MCV 90.5 11/11/2013   PLT 195 11/11/2013      Chemistry      Component Value Date/Time   NA 141 11/11/2013 1459  NA 142 12/05/2011 1342   K 3.9 11/11/2013 1459   K 4.3 12/05/2011 1342   CL 104 12/11/2012 1045   CL 105 12/05/2011 1342   CO2 28 11/11/2013 1459   CO2 27 12/05/2011 1342   BUN 13.5 11/11/2013 1459   BUN 13 12/05/2011 1342   CREATININE 0.8  11/11/2013 1459   CREATININE 0.82 12/05/2011 1342      Component Value Date/Time   CALCIUM 9.3 11/11/2013 1459   CALCIUM 9.1 12/05/2011 1342   ALKPHOS 51 11/11/2013 1459   ALKPHOS 38* 12/05/2011 1342   AST 19 11/11/2013 1459   AST 17 12/05/2011 1342   ALT 11 11/11/2013 1459   ALT 9 12/05/2011 1342   BILITOT 0.24 11/11/2013 1459   BILITOT 0.4 12/05/2011 1342       Lab Results  Component Value Date   LABCA2 21 12/05/2011    Urinalysis    Component Value Date/Time   COLORURINE AMBER BIOCHEMICALS MAY BE AFFECTED BY COLOR* 12/14/2007 1554   APPEARANCEUR CLOUDY* 12/14/2007 1554   LABSPEC 1.035* 12/14/2007 1554   PHURINE 6.0 12/14/2007 1554   GLUCOSEU NEGATIVE 12/14/2007 1554   HGBUR SMALL* 12/14/2007 1554   BILIRUBINUR neg 07/23/2013 1325   BILIRUBINUR SMALL* 12/14/2007 1554   KETONESUR TRACE* 12/14/2007 1554   PROTEINUR 30* 12/14/2007 1554   UROBILINOGEN 0.2 07/23/2013 1325   UROBILINOGEN 1.0 12/14/2007 1554   NITRITE positive 07/23/2013 1325   NITRITE NEGATIVE 12/14/2007 1554   LEUKOCYTESUR Negative 07/23/2013 1325    STUDIES: 1.  Dg Chest 2 View 11/06/2013   CLINICAL DATA:  Chest pain  EXAM: CHEST  2 VIEW  COMPARISON:  07/23/2013  FINDINGS: The cardiac shadow is within normal limits. The lungs are clear bilaterally. No acute bony abnormality is seen.  IMPRESSION: No acute abnormality noted.   Electronically Signed   By: Alcide Clever M.D.   On: 11/06/2013 09:46   2.  3D Bilateral digital diagnostic mammogram on 06/10/2013 showed the breast tissue is heterogeneously dense, which limits mammographic sensitivity and is an independent risk factor for breast cancer and also decreases mammographic sensitivity.  There is stable architectural distortion at the lumpectomy site in the right.  No new or worrisome finding is seen in either breast.  Benign findings.    3.  Bone density scan on 12/06/2006 shows a T score of -1.2 (osteopenia).    ASSESSMENT:[name] is a 67 y.o.   [name], [name]:  1.  Right breast needle core biopsy at the 9 o'clock position on 11/11/2006 which showed an invasive tumor, estrogen receptor 99% positive, progesterone receptor 100% positive, Ki-67 11%, HER-2/neu negative.  2.  Bilateral breast MRI on 11/26/2006 showed a 1.9 x 1.5 x 1.4 cm round mass in the right breast at the 9 o'clock location with spiculated margins and washin/washout kinetics.  No T2-weighted hyperintensity or lymphadenopathy is seen on either side.  In the left breast there is an additional abnormal area of enhancement at the 10 o'clock location measuring 9 mm, with spiculated margins and washin/washout genetics (clinical stage I, T1 N0).  3.  Status post right breast lumpectomy with right axillary sentinel node biopsy on 12/11/2006 for a stage I, pT1c, pN0(i-)(sn), pMX, 1.7 cm invasive ductal carcinoma with tubular features, grade 1, with low-grade ductal carcinoma in situ, negative surgical margins, estrogen receptor 99 % positive, progesterone receptor 100% positive, Ki-67 11%, HER-2/neu negative, with 0/2 metastatic right axillary lymph nodes.  4.  Oncotype DX report dated 01/16/2007 showed  a recurrent score of 8 with average rate of distant recurrence at 10 years of 6%. 5.  Radiation therapy from 02/24/2007 through 04/30/2007.  6.  Antiestrogen therapy with Tamoxifen started in 05/2007, but stopped taking Tamoxifen by 02/2008.  [name] states she is not interested in taking any further antiestrogen therapy.    PLAN: [name]  is over 7 years from her time of diagnosis and will officially become a graduate of [facility]'s breast cancer program today. We asked that she continue annual clinical breast examinations by a physician in addition to annual mammography.  Her most recent mammogram results are listed above.  She received an anemia laboratory workup today (including LDH, ferritin, folate RBC, vitamin B12 and CBC) and we will see her again in  01/2014 for anemia.  We will check laboratories of CBC, CMP, and LDH at that time.  All questions were answered.  [name] was encouraged to contact us with any problems, questions or concerns.   Lori Bras, [name] 11/13/2013   3:09 PM  ADDENDUM: I met today with this 67 year old [place] [name] and we discussed her diagnosis, treatment history, and prognosis. In brief:  [name] underwent right lumpectomy and sentinel lymph node sampling December of 2007 for a pT1c pN0, stage IA invasive ductal carcinoma, grade 1, strongly estrogen and progesterone receptor positive, with a low MIB-1-1 and no HER-2 amplification. This pattern suggests a luminal A subtype in the good prognosis associated with that classification was confirmed by Oncotype, which predicted a 6% risk of out of breast recurrence within 10 years if the patient's only systemic treatment was tamoxifen for 5 years.  However, despite what one finds in the prior notes, the patient tells me today she never started tamoxifen. We reviewed the above information, and she understands that in the absence of systemic treatment her risk of recurrence at baseline likely would've been in the 24 percent range. This includes in breast recurrences of course. Since 7 years have passed, most of those recurrences would have been expected to have occurred, and since she is not among them her current risk is considerably less. If we use a 10% risk of present then taking aromatase inhibitors for 5 years would cut that risk in half.  The patient has a good understanding of this discussion. As before, however, she prefers not to take anti-estrogens. We discussed followup, and she understands all she needs in terms of breast cancer screening is a yearly physician breast exam and yearly mammography. She can obtain these for her primary care physician, and accordingly we have not made any further routine appointments for [facility] here. Of course we will be glad to see  her at any point in the future if the need arises. There is very much in agreement with this plan.  I personally saw this patient and performed a substantive portion of this encounter with the listed APP documented above.   Lowella Dell, MD

## 2013-11-12 LAB — VITAMIN B12: Vitamin B-12: 342 pg/mL (ref 211–911)

## 2013-11-13 ENCOUNTER — Encounter: Payer: Self-pay | Admitting: Family

## 2014-01-25 ENCOUNTER — Other Ambulatory Visit (HOSPITAL_COMMUNITY)
Admission: RE | Admit: 2014-01-25 | Discharge: 2014-01-25 | Disposition: A | Discharge status: Home / Self Care | Payer: Medicare Other | Source: Ambulatory Visit | Attending: Family Medicine | Admitting: Family Medicine

## 2014-01-25 ENCOUNTER — Other Ambulatory Visit: Payer: Self-pay | Admitting: Family Medicine

## 2014-01-25 DIAGNOSIS — Z124 Encounter for screening for malignant neoplasm of cervix: Secondary | ICD-10-CM | POA: Diagnosis not present

## 2014-01-25 DIAGNOSIS — Z23 Encounter for immunization: Secondary | ICD-10-CM | POA: Diagnosis not present

## 2014-01-25 DIAGNOSIS — Z136 Encounter for screening for cardiovascular disorders: Secondary | ICD-10-CM | POA: Diagnosis not present

## 2014-01-25 DIAGNOSIS — Z0000 Encounter for general adult medical examination without abnormal findings: Secondary | ICD-10-CM | POA: Diagnosis not present

## 2014-01-25 DIAGNOSIS — Z1331 Encounter for screening for depression: Secondary | ICD-10-CM | POA: Diagnosis not present

## 2014-01-25 DIAGNOSIS — Z01419 Encounter for gynecological examination (general) (routine) without abnormal findings: Secondary | ICD-10-CM | POA: Diagnosis not present

## 2014-02-03 ENCOUNTER — Other Ambulatory Visit: Payer: Self-pay | Admitting: *Deleted

## 2014-02-03 DIAGNOSIS — C50919 Malignant neoplasm of unspecified site of unspecified female breast: Secondary | ICD-10-CM

## 2014-02-03 DIAGNOSIS — Z780 Asymptomatic menopausal state: Secondary | ICD-10-CM | POA: Diagnosis not present

## 2014-02-04 ENCOUNTER — Ambulatory Visit (HOSPITAL_BASED_OUTPATIENT_CLINIC_OR_DEPARTMENT_OTHER): Payer: Medicare Other | Admitting: Oncology

## 2014-02-04 ENCOUNTER — Other Ambulatory Visit (HOSPITAL_BASED_OUTPATIENT_CLINIC_OR_DEPARTMENT_OTHER): Payer: Medicare Other

## 2014-02-04 VITALS — BP 128/83 | HR 98 | Temp 98.9°F | Resp 99 | Ht 65.5 in | Wt 152.7 lb

## 2014-02-04 DIAGNOSIS — D649 Anemia, unspecified: Secondary | ICD-10-CM

## 2014-02-04 DIAGNOSIS — C50919 Malignant neoplasm of unspecified site of unspecified female breast: Secondary | ICD-10-CM

## 2014-02-04 DIAGNOSIS — Z853 Personal history of malignant neoplasm of breast: Secondary | ICD-10-CM

## 2014-02-04 DIAGNOSIS — C50911 Malignant neoplasm of unspecified site of right female breast: Secondary | ICD-10-CM

## 2014-02-04 LAB — CBC WITH DIFFERENTIAL/PLATELET
BASO%: 0.2 % (ref 0.0–2.0)
BASOS ABS: 0 10*3/uL (ref 0.0–0.1)
EOS ABS: 0.1 10*3/uL (ref 0.0–0.5)
EOS%: 2.7 % (ref 0.0–7.0)
HCT: 38.4 % (ref 34.8–46.6)
HGB: 12.6 g/dL (ref 11.6–15.9)
LYMPH%: 42.1 % (ref 14.0–49.7)
MCH: 29.6 pg (ref 25.1–34.0)
MCHC: 32.8 g/dL (ref 31.5–36.0)
MCV: 90.1 fL (ref 79.5–101.0)
MONO#: 0.4 10*3/uL (ref 0.1–0.9)
MONO%: 7.8 % (ref 0.0–14.0)
NEUT%: 47.2 % (ref 38.4–76.8)
NEUTROS ABS: 2.3 10*3/uL (ref 1.5–6.5)
PLATELETS: 207 10*3/uL (ref 145–400)
RBC: 4.26 10*6/uL (ref 3.70–5.45)
RDW: 14 % (ref 11.2–14.5)
WBC: 4.9 10*3/uL (ref 3.9–10.3)
lymph#: 2.1 10*3/uL (ref 0.9–3.3)

## 2014-02-04 LAB — COMPREHENSIVE METABOLIC PANEL (CC13)
ALT: 9 U/L (ref 0–55)
ANION GAP: 10 meq/L (ref 3–11)
AST: 14 U/L (ref 5–34)
Albumin: 4 g/dL (ref 3.5–5.0)
Alkaline Phosphatase: 50 U/L (ref 40–150)
BUN: 12.5 mg/dL (ref 7.0–26.0)
CALCIUM: 9.3 mg/dL (ref 8.4–10.4)
CHLORIDE: 105 meq/L (ref 98–109)
CO2: 26 meq/L (ref 22–29)
CREATININE: 0.9 mg/dL (ref 0.6–1.1)
Glucose: 128 mg/dl (ref 70–140)
Potassium: 4.1 mEq/L (ref 3.5–5.1)
Sodium: 140 mEq/L (ref 136–145)
TOTAL PROTEIN: 6.9 g/dL (ref 6.4–8.3)
Total Bilirubin: 0.27 mg/dL (ref 0.20–1.20)

## 2014-02-04 NOTE — Progress Notes (Signed)
[name]  [phone] Fax:(336) [phone]  OFFICE PROGRESS NOTE   ID: [name]   DOB: [birth year]  MR#: [id number]  ION#:[id number]   PCP: [name], M.D. SU: Lori Buerger, MD) RAD ONC: Lori Pray, MD, Lori Kato MD   HISTORY OF PRESENT ILLNESS: From Dr. Collier Salina [name]'s new patient evaluation note dated 12/25/2006:  "This is a 68 year old woman from [facility] referred by Dr. Annamaria [name] for evaluation and treatment of breast cancer. This woman undergoes routine annual screening mammography.  She actually had some thickening in upper outer quadrant of the right breast and was referred for mammogram.  At the time of initial evaluation in November it was noted that she had a 2 cm nontender mass in the right breast.  Ultrasound of the right breast showed a hypoechoic mass with ill-defined borders measuring 1.9 x 1.5 x 1.5 cm.  A biopsy of the right breast on 11/11/06 showed invasive ductal cancer with associated DCIS, an associated lymph node was negative.  The tumor was ER and PR was positive with 99 and 100% respectively.   Proliferative index was 11%.  HER-2 was 1+.  A BSGI exam on 11/18/06 on the right side showed the mass at 9 o'clock.  There is moderate activity seen on the left breast at about 6 o'clock position.  An MRI scan of both breasts on 11/26/06 showed the enhancing mass at the 9 o'clock position of the right breast.  The left breast did show her to have normal area of enhancement at 10 o'clock.  Core biopsy was recommended.  Second look ultrasound on November 28, 2006 of the left breast showed a 1 cm suspicious enhancing mass anterior to chest wall.  This was located on ultrasound and this was biopsied on 11/28/06 and actually two lesions, one at 6 o'clock and one at 10 o'clock were biopsied, both of which showed fibrocystic changes.  No evidence of malignancy.  Patient was referred to Dr. Marylene [name] who took her to the operating room for a lumpectomy with  sentinel lymph node evaluation.  On 12/11/06, final pathology showed a 1.7 cm grade 1 ductal cancer, two sentinel lymph nodes were identified, both of which were negative for malignancy.  Surgical margins were clear.  Lymphovascular invasion was not seen."  Her subsequent history is as detailed below.   INTERVAL HISTORY:  [name] returns today for followup of her breast cancer. Interval history is generally unremarkable. She is doing "good", trying to have positive fox, and going to AA regularly. She finds this more helpful in a psychiatrist (which she currently does not have).  REVIEW OF SYSTEMS: She has rare headaches. She feels weak and spends 2 or 3 days every week at home does not feeling well. It's mostly her stomach a mushy says. She has bony aches and pains" loss of arthritis". Her hearing is poor. Just sinus problems, some sores in her mouth, feel short of breath when walking up stairs, but denies cough or phlegm production that has not been any fever. She has problems with her gums and dentures. She has stress urinary incontinence. He feels anxious and depressed. She does not exercise regularly (and refused a free membership to the [employer] today) but she does work in her yard "a lot". A detailed review of systems today was otherwise stable.  PAST MEDICAL HISTORY: Past Medical History  Diagnosis Date  . Colitis   . Hyperlipidemia   . Allergy   . Anemia   .  Depression   . Anxiety   . Cancer 10/2006    Invasive ductal carcinoma of the right breast   . Pneumonia   . Arthritis   . Tendon tear   . Bursitis of hip   . 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 Astigmatism   . Cataracts, bilateral   . Gall stones   . Fibromyalgia   . GERD (gastroesophageal reflux disease)   . Endometriosis   . Bursitis   . 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  She has ongoing anxiety-related  issues.     PAST SURGICAL HISTORY: Past Surgical History  Procedure Laterality Date  . Appendectomy    . Breast surgery Right 12/11/2006    Lumpectomy  . Carpal tunnel release    . Lumbar fusion      FAMILY HISTORY Family History  Problem Relation Age of Onset  . Cancer Mother   . Heart disease Mother   . Cancer Father     Colon cancer  . Heart disease Father   . Heart disease Brother   . Heart disease Brother   . Vision loss Sister   Father died of heart disease, had colon cancer.  Mother died of heart disease, had lung cancer.  Three siblings alive and well; two brothers and one sister, one in [place], [place] and two brothers in [place], [place].   GYNECOLOGIC HISTORY: She is gravida 1, para 1.  Menarche age 69.  Age of parity 7.   Postmenopausal since the age of 41.  Hormone replacement therapy for 15 years.    SOCIAL HISTORY: She is originally from [place], [place].  She has been divorced since 2000.  She has one adult daughter and 2 grandchildren.  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  In her spare time she enjoys dining out, reading, and attending church services at [employer].  She states her daughter lives in [place], [place] and suffers from anxiety/depression also.      ADVANCED DIRECTIVES:  Not on file   HEALTH MAINTENANCE: History  Substance Use Topics  . Smoking status: Former Research scientist (life sciences)  . Smokeless tobacco: Never Used  . Alcohol Use: No     Comment: AA since 1989    Colonoscopy:  Patient states she had a colonoscopy in 2012 with Dr. Juanita [name] PAP:  Not on file Bone density: Bone density scan on 12/06/2006 shows a T score of -1.2 (osteopenia). Lipid panel: Not on file   Allergies  Allergen Reactions  . Sulfa Drugs Cross Reactors     Current Outpatient  Prescriptions  Medication Sig Dispense Refill  . acyclovir (ZOVIRAX) 200 MG capsule Take 200 mg by mouth 2 (two) times daily.       Marland Kitchen azithromycin (ZITHROMAX) 250 MG tablet Take 2 pills by mouth on day 1, then 1 pill by mouth per day on days 2 through 5.  6 each  0  . buPROPion (WELLBUTRIN XL) 300 MG 24 hr tablet Take 300 mg by mouth daily.        . cetirizine (ZYRTEC) 10 MG tablet Take 10 mg by mouth daily.        . cyclobenzaprine (FLEXERIL) 10 MG tablet Take 10 mg by mouth at bedtime.       Marland Kitchen  diphenoxylate-atropine (LOMOTIL) 2.5-0.025 MG per tablet Take 1 tablet by mouth as needed.       Marland Kitchen escitalopram (LEXAPRO) 20 MG tablet Take 20 mg by mouth daily.       . fluticasone (FLONASE) 50 MCG/ACT nasal spray Place 2 sprays into both nostrils daily.       Marland Kitchen ibuprofen (ADVIL,MOTRIN) 800 MG tablet Take 800 mg by mouth every 8 (eight) hours as needed.       . lansoprazole (PREVACID) 15 MG capsule Take 15 mg by mouth daily.       Marland Kitchen LORazepam (ATIVAN) 0.5 MG tablet Take 1 mg by mouth at bedtime.       . ondansetron (ZOFRAN) 8 MG tablet Take 8 mg by mouth every 8 (eight) hours as needed.       Marland Kitchen PREVIDENT 5000 BOOSTER PLUS 1.1 % PSTE Place 1 each onto teeth 2 (two) times daily.       Marland Kitchen PROAIR HFA 108 (90 BASE) MCG/ACT inhaler Inhale 1 puff into the lungs at bedtime.       . simvastatin (ZOCOR) 20 MG tablet Take 20 mg by mouth at bedtime.        . traMADol (ULTRAM) 50 MG tablet Take 1 tablet (50 mg total) by mouth every 6 (six) hours as needed.  15 tablet  0  . traZODone (DESYREL) 50 MG tablet Take 50 mg by mouth at bedtime.        No current facility-administered medications for this visit.    OBJECTIVE: Middle-aged white woman who appears stated age 57 Vitals:   02/04/14 1419  BP: 128/83  Pulse: 98  Temp: 98.9 F (37.2 C)  Resp: 99     Body mass index is 25.02 kg/(m^2).      ECOG FS: 1 - Symptomatic but completely ambulatory  Sclerae unicteric, pupils equal and round Oropharynx clear  and moist-- no thrush No cervical or supraclavicular adenopathy Lungs no rales or rhonchi Heart regular rate and rhythm Abd soft, nontender, positive bowel sounds MSK no focal spinal tenderness, no upper extremity lymphedema Neuro: nonfocal, well oriented, appropriate affect Breasts: The right breast is status post lumpectomy and radiation. There is no evidence of local recurrence. The right axilla is benign. The left breast is unremarkable    LAB RESULTS: Lab Results  Component Value Date   WBC 4.9 02/04/2014   NEUTROABS 2.3 02/04/2014   HGB 12.6 02/04/2014   HCT 38.4 02/04/2014   MCV 90.1 02/04/2014   PLT 207 02/04/2014      Chemistry      Component Value Date/Time   NA 141 11/11/2013 1459   NA 142 12/05/2011 1342   K 3.9 11/11/2013 1459   K 4.3 12/05/2011 1342   CL 104 12/11/2012 1045   CL 105 12/05/2011 1342   CO2 28 11/11/2013 1459   CO2 27 12/05/2011 1342   BUN 13.5 11/11/2013 1459   BUN 13 12/05/2011 1342   CREATININE 0.8 11/11/2013 1459   CREATININE 0.82 12/05/2011 1342      Component Value Date/Time   CALCIUM 9.3 11/11/2013 1459   CALCIUM 9.1 12/05/2011 1342   ALKPHOS 51 11/11/2013 1459   ALKPHOS 38* 12/05/2011 1342   AST 19 11/11/2013 1459   AST 17 12/05/2011 1342   ALT 11 11/11/2013 1459   ALT 9 12/05/2011 1342   BILITOT 0.24 11/11/2013 1459   BILITOT 0.4 12/05/2011 1342       Lab Results  Component Value Date   LABCA2  21 12/05/2011    Urinalysis    Component Value Date/Time   COLORURINE AMBER BIOCHEMICALS MAY BE AFFECTED BY COLOR* 12/14/2007 1554   APPEARANCEUR CLOUDY* 12/14/2007 1554   LABSPEC 1.035* 12/14/2007 1554   PHURINE 6.0 12/14/2007 1554   GLUCOSEU NEGATIVE 12/14/2007 1554   HGBUR SMALL* 12/14/2007 1554   BILIRUBINUR neg 07/23/2013 1325   BILIRUBINUR SMALL* 12/14/2007 1554   KETONESUR TRACE* 12/14/2007 1554   PROTEINUR 30* 12/14/2007 1554   UROBILINOGEN 0.2 07/23/2013 1325   UROBILINOGEN 1.0 12/14/2007 1554   NITRITE positive  07/23/2013 1325   NITRITE NEGATIVE 12/14/2007 1554   LEUKOCYTESUR Negative 07/23/2013 1325    STUDIES: No results found.  ASSESSMENT:[name] is a 68 y.o.  [place], [place] woman:  1.  Right breast needle core biopsy at the 9 o'clock position on 11/11/2006 which showed an invasive tumor, estrogen receptor 99% positive, progesterone receptor 100% positive, Ki-67 11%, HER-2/neu negative.  2.  Bilateral breast MRI on 11/26/2006 showed a 1.9 x 1.5 x 1.4 cm round mass in the right breast at the 9 o'clock location with spiculated margins and washin/washout kinetics.  No T2-weighted hyperintensity or lymphadenopathy is seen on either side.  In the left breast there is an additional abnormal area of enhancement at the 10 o'clock location measuring 9 mm, with spiculated margins and washin/washout genetics (clinical stage I, T1 N0).  3.  Status post right breast lumpectomy with right axillary sentinel node biopsy on 12/11/2006 for a stage I, pT1c, pN0(i-)(sn), pMX, 1.7 cm invasive ductal carcinoma with tubular features, grade 1, with low-grade ductal carcinoma in situ, negative surgical margins, estrogen receptor 99 % positive, progesterone receptor 100% positive, Ki-67 11%, HER-2/neu negative, with 0/2 metastatic right axillary lymph nodes.  4.  Oncotype DX report dated 01/16/2007 showed a recurrent score of 8 with average rate of distant recurrence at 10 years of 6%.  5.  Radiation therapy from 02/24/2007 through 04/30/2007.  6.  Antiestrogen therapy with Tamoxifen started in 05/2007, but stopped taking Tamoxifen by 02/2008.  [name] states she is not interested in taking any further antiestrogen therapy.    PLAN: Kallista Pae is doing fine as far as her breast cancer is concerned, and I am comfortable releasing her to her primary care physician at this point. She gets her mammograms usually in June, and as far as breast cancer followup is concerned all she needs is yearly mammography  and yearly physician breast exam.  Latessa knows I will be glad to see her at any point in the future if the need arises, but as of now no further appointments are being made for her here.

## 2014-02-05 ENCOUNTER — Telehealth: Payer: Self-pay | Admitting: Oncology

## 2014-02-05 NOTE — Telephone Encounter (Signed)
Sent letter to [place] office from Dr. Jana Hakim.

## 2014-02-22 DIAGNOSIS — F329 Major depressive disorder, single episode, unspecified: Secondary | ICD-10-CM | POA: Diagnosis not present

## 2014-02-22 DIAGNOSIS — F3289 Other specified depressive episodes: Secondary | ICD-10-CM | POA: Diagnosis not present

## 2014-02-22 DIAGNOSIS — Z733 Stress, not elsewhere classified: Secondary | ICD-10-CM | POA: Diagnosis not present

## 2014-02-26 DIAGNOSIS — K130 Diseases of lips: Secondary | ICD-10-CM | POA: Diagnosis not present

## 2014-02-26 DIAGNOSIS — L708 Other acne: Secondary | ICD-10-CM | POA: Diagnosis not present

## 2014-02-26 DIAGNOSIS — L819 Disorder of pigmentation, unspecified: Secondary | ICD-10-CM | POA: Diagnosis not present

## 2014-04-08 DIAGNOSIS — Z86010 Personal history of colonic polyps: Secondary | ICD-10-CM | POA: Diagnosis not present

## 2014-04-08 DIAGNOSIS — K5900 Constipation, unspecified: Secondary | ICD-10-CM | POA: Diagnosis not present

## 2014-04-08 DIAGNOSIS — R141 Gas pain: Secondary | ICD-10-CM | POA: Diagnosis not present

## 2014-04-08 DIAGNOSIS — K219 Gastro-esophageal reflux disease without esophagitis: Secondary | ICD-10-CM | POA: Diagnosis not present

## 2014-04-08 DIAGNOSIS — R142 Eructation: Secondary | ICD-10-CM | POA: Diagnosis not present

## 2014-04-16 DIAGNOSIS — M25579 Pain in unspecified ankle and joints of unspecified foot: Secondary | ICD-10-CM | POA: Diagnosis not present

## 2014-04-16 DIAGNOSIS — M76829 Posterior tibial tendinitis, unspecified leg: Secondary | ICD-10-CM | POA: Diagnosis not present

## 2014-04-29 DIAGNOSIS — R110 Nausea: Secondary | ICD-10-CM | POA: Diagnosis not present

## 2014-04-29 DIAGNOSIS — F329 Major depressive disorder, single episode, unspecified: Secondary | ICD-10-CM | POA: Diagnosis not present

## 2014-04-29 DIAGNOSIS — E785 Hyperlipidemia, unspecified: Secondary | ICD-10-CM | POA: Diagnosis not present

## 2014-04-29 DIAGNOSIS — Z733 Stress, not elsewhere classified: Secondary | ICD-10-CM | POA: Diagnosis not present

## 2014-04-29 DIAGNOSIS — F3289 Other specified depressive episodes: Secondary | ICD-10-CM | POA: Diagnosis not present

## 2014-05-01 DIAGNOSIS — H43399 Other vitreous opacities, unspecified eye: Secondary | ICD-10-CM | POA: Diagnosis not present

## 2014-05-03 DIAGNOSIS — H43819 Vitreous degeneration, unspecified eye: Secondary | ICD-10-CM | POA: Diagnosis not present

## 2014-05-03 DIAGNOSIS — H43399 Other vitreous opacities, unspecified eye: Secondary | ICD-10-CM | POA: Diagnosis not present

## 2014-05-06 DIAGNOSIS — H2510 Age-related nuclear cataract, unspecified eye: Secondary | ICD-10-CM | POA: Diagnosis not present

## 2014-05-06 DIAGNOSIS — H5200 Hypermetropia, unspecified eye: Secondary | ICD-10-CM | POA: Diagnosis not present

## 2014-05-06 DIAGNOSIS — H2589 Other age-related cataract: Secondary | ICD-10-CM | POA: Diagnosis not present

## 2014-05-06 DIAGNOSIS — H52229 Regular astigmatism, unspecified eye: Secondary | ICD-10-CM | POA: Diagnosis not present

## 2014-05-26 ENCOUNTER — Emergency Department (HOSPITAL_COMMUNITY)
Admission: EM | Admit: 2014-05-26 | Discharge: 2014-05-26 | Disposition: A | Discharge status: Home / Self Care | Payer: Medicare Other | Attending: Emergency Medicine | Admitting: Emergency Medicine

## 2014-05-26 ENCOUNTER — Encounter (HOSPITAL_COMMUNITY): Payer: Self-pay | Admitting: Emergency Medicine

## 2014-05-26 DIAGNOSIS — Z8742 Personal history of other diseases of the female genital tract: Secondary | ICD-10-CM | POA: Diagnosis not present

## 2014-05-26 DIAGNOSIS — K219 Gastro-esophageal reflux disease without esophagitis: Secondary | ICD-10-CM | POA: Diagnosis not present

## 2014-05-26 DIAGNOSIS — R5381 Other malaise: Secondary | ICD-10-CM | POA: Diagnosis not present

## 2014-05-26 DIAGNOSIS — Z008 Encounter for other general examination: Secondary | ICD-10-CM | POA: Insufficient documentation

## 2014-05-26 DIAGNOSIS — F3289 Other specified depressive episodes: Secondary | ICD-10-CM | POA: Diagnosis not present

## 2014-05-26 DIAGNOSIS — Z792 Long term (current) use of antibiotics: Secondary | ICD-10-CM | POA: Insufficient documentation

## 2014-05-26 DIAGNOSIS — Z8701 Personal history of pneumonia (recurrent): Secondary | ICD-10-CM | POA: Insufficient documentation

## 2014-05-26 DIAGNOSIS — F411 Generalized anxiety disorder: Secondary | ICD-10-CM | POA: Diagnosis not present

## 2014-05-26 DIAGNOSIS — Z87891 Personal history of nicotine dependence: Secondary | ICD-10-CM | POA: Diagnosis not present

## 2014-05-26 DIAGNOSIS — R404 Transient alteration of awareness: Secondary | ICD-10-CM | POA: Diagnosis not present

## 2014-05-26 DIAGNOSIS — Z8669 Personal history of other diseases of the nervous system and sense organs: Secondary | ICD-10-CM | POA: Insufficient documentation

## 2014-05-26 DIAGNOSIS — E785 Hyperlipidemia, unspecified: Secondary | ICD-10-CM | POA: Diagnosis not present

## 2014-05-26 DIAGNOSIS — F329 Major depressive disorder, single episode, unspecified: Secondary | ICD-10-CM | POA: Insufficient documentation

## 2014-05-26 DIAGNOSIS — Z79899 Other long term (current) drug therapy: Secondary | ICD-10-CM | POA: Insufficient documentation

## 2014-05-26 DIAGNOSIS — Z862 Personal history of diseases of the blood and blood-forming organs and certain disorders involving the immune mechanism: Secondary | ICD-10-CM | POA: Insufficient documentation

## 2014-05-26 DIAGNOSIS — M129 Arthropathy, unspecified: Secondary | ICD-10-CM | POA: Diagnosis not present

## 2014-05-26 DIAGNOSIS — Z853 Personal history of malignant neoplasm of breast: Secondary | ICD-10-CM | POA: Insufficient documentation

## 2014-05-26 DIAGNOSIS — IMO0002 Reserved for concepts with insufficient information to code with codable children: Secondary | ICD-10-CM | POA: Insufficient documentation

## 2014-05-26 DIAGNOSIS — R5383 Other fatigue: Secondary | ICD-10-CM | POA: Diagnosis not present

## 2014-05-26 DIAGNOSIS — Z0000 Encounter for general adult medical examination without abnormal findings: Secondary | ICD-10-CM

## 2014-05-26 DIAGNOSIS — M7989 Other specified soft tissue disorders: Secondary | ICD-10-CM | POA: Insufficient documentation

## 2014-05-26 NOTE — ED Notes (Signed)
Bed: WA23 Expected date:  Expected time:  Means of arrival:  Comments: EMS-weakness 

## 2014-05-26 NOTE — ED Notes (Signed)
Per EMS, pt complains of left lower leg swelling since last night at 7PM. Pt states she also fell down 2-3 steps last week when she was trying to move boxes from her house. Pt also complains of 6 horizontal red lines running across her left lower leg , which are no longer visible. Pt has been elevating and icing her left leg since noticing the lines and swelling.

## 2014-05-26 NOTE — ED Provider Notes (Signed)
CSN: [id number]     Arrival date & time 05/26/14  1833 History   First MD Initiated Contact with Patient 05/26/14 1901     Chief Complaint  Patient presents with  . Leg Swelling     (Consider location/radiation/quality/duration/timing/severity/associated sxs/prior Treatment) The history is provided by the patient.  pt states yesterday her left lower leg seemed swollen and that she noted a few linear, horizontal, erythematous lines on lower leg posteriorly.  Pt indicates she had been up on her feet the entirety of the day, moving.  States today the swelling is much better, and horizontal lines gone.  No leg pain. Denies injury to leg. No numbness/weakness. Normal gait. No hx dvt or pe. No immobility, trauma, surgery. No cp or sob. No current skin changes or erythema. No rash. No fever or chills.     Past Medical History  Diagnosis Date  . Colitis   . Hyperlipidemia   . Allergy   . Anemia   . Depression   . Anxiety   . Cancer 10/2006    Invasive ductal carcinoma of the right breast   . Pneumonia   . Arthritis   . Tendon tear   . Bursitis of hip   . 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 Astigmatism   . Cataracts, bilateral   . Gall stones   . Fibromyalgia   . GERD (gastroesophageal reflux disease)   . Endometriosis   . Bursitis   . Carpal tunnel syndrome    Past Surgical History  Procedure Laterality Date  . Appendectomy    . Breast surgery Right 12/11/2006    Lumpectomy  . Carpal tunnel release    . Lumbar fusion     Family History  Problem Relation Age of Onset  . Cancer Mother   . Heart disease Mother   . Cancer Father     Colon cancer  . Heart disease Father   . Heart disease Brother   . Heart disease Brother   . Vision loss Sister    History  Substance Use Topics  . Smoking status: Former Research scientist (life sciences)  . Smokeless tobacco: Never Used  . Alcohol Use: No     Comment: AA since 1989   OB History   Grav Para Term Preterm Abortions TAB SAB Ect Mult Living                  Review of Systems  Constitutional: Negative for fever and chills.  HENT: Negative for sore throat.   Eyes: Negative for redness.  Respiratory: Negative for shortness of breath.   Cardiovascular: Negative for chest pain.  Gastrointestinal: Negative for vomiting and abdominal pain.  Genitourinary: Negative for flank pain.  Musculoskeletal: Negative for back pain and neck pain.  Skin: Negative for rash.  Neurological: Negative for weakness, numbness and headaches.  Hematological: Does not bruise/bleed easily.  Psychiatric/Behavioral: Negative for confusion.      Allergies  Sulfa drugs cross reactors  Home Medications   Prior to Admission medications   Medication Sig Start Date End Date Taking? Authorizing Provider  acyclovir (ZOVIRAX) 200 MG capsule Take 200 mg by mouth 2 (two) times daily.  10/26/13   Historical Provider, MD  azithromycin (ZITHROMAX) 250 MG tablet Take 2 pills by mouth on day 1, then 1 pill by mouth per day on days 2 through 5. 11/06/13   Wendie Agreste, MD  buPROPion (WELLBUTRIN XL) 300 MG 24 hr tablet Take 300 mg by mouth daily.  Historical Provider, MD  cetirizine (ZYRTEC) 10 MG tablet Take 10 mg by mouth daily.      Historical Provider, MD  cyclobenzaprine (FLEXERIL) 10 MG tablet Take 10 mg by mouth at bedtime.  10/24/13   Historical Provider, MD  diphenoxylate-atropine (LOMOTIL) 2.5-0.025 MG per tablet Take 1 tablet by mouth as needed.  09/09/13   Historical Provider, MD  escitalopram (LEXAPRO) 20 MG tablet Take 20 mg by mouth daily.     Historical Provider, MD  fluticasone (FLONASE) 50 MCG/ACT nasal spray Place 2 sprays into both nostrils daily.  10/28/13   Historical Provider, MD  ibuprofen (ADVIL,MOTRIN) 800 MG tablet Take 800 mg by mouth every 8 (eight) hours as needed.  11/10/13   Historical Provider, MD  lansoprazole (PREVACID) 15 MG capsule Take 15 mg by mouth daily.     Historical Provider, MD  LORazepam (ATIVAN) 0.5 MG tablet Take 1 mg by mouth  at bedtime.  10/30/13   Historical Provider, MD  ondansetron (ZOFRAN) 8 MG tablet Take 8 mg by mouth every 8 (eight) hours as needed.  09/04/13   Historical Provider, MD  PREVIDENT 5000 BOOSTER PLUS 1.1 % PSTE Place 1 each onto teeth 2 (two) times daily.  09/23/13   Historical Provider, MD  PROAIR HFA 108 (90 BASE) MCG/ACT inhaler Inhale 1 puff into the lungs at bedtime.  09/09/13   Historical Provider, MD  simvastatin (ZOCOR) 20 MG tablet Take 20 mg by mouth at bedtime.      Historical Provider, MD  traMADol (ULTRAM) 50 MG tablet Take 1 tablet (50 mg total) by mouth every 6 (six) hours as needed. 11/06/13   Wendie Agreste, MD  traZODone (DESYREL) 50 MG tablet Take 50 mg by mouth at bedtime.  10/30/13   Historical Provider, MD   BP 127/77  Pulse 86  Temp(Src) 98.3 F (36.8 C) (Oral)  Resp 18  SpO2 96% Physical Exam  Nursing note and vitals reviewed. Constitutional: She is oriented to person, place, and time. She appears well-developed and well-nourished. No distress.  HENT:  Head: Atraumatic.  Eyes: Conjunctivae are normal. No scleral icterus.  Neck: Neck supple. No tracheal deviation present.  Cardiovascular: Normal rate.   Pulmonary/Chest: Effort normal and breath sounds normal. No respiratory distress.  Abdominal: Soft. Normal appearance. She exhibits no distension. There is no tenderness.  Musculoskeletal: Normal range of motion. She exhibits no edema and no tenderness.  Normal rom left hip, knee and ankle. Fem/dp/pt 2+ pulses. Leg appears normal. No swelling notes. No skin changes, erythema, lesions or lines.   Neurological: She is alert and oriented to person, place, and time.  Motor 5/5. sens intact.   Skin: Skin is warm and dry. No rash noted. No erythema.  Psychiatric: She has a normal mood and affect.    ED Course  Procedures (including critical care time)   MDM  Reviewed nursing notes and prior charts for additional history.   No swelling currently. Distal pulses palp.   No skin changes, 'lines' or any other abnormality. Vascular lab closed for day.  Suspicion for dvt v low, therefore will hold on any anticoag for now.  Discussed plan of vascular lab tomorrow.   Pt appears stable for d/c.     Mirna Mires, MD 05/26/14 [phone]

## 2014-05-26 NOTE — Discharge Instructions (Signed)
Elevate leg.  Follow up for vascular doppler study at [facility] tomorrow morning.  Return to ER if worse, new symptoms, fevers, severe swelling, other concern.

## 2014-05-27 ENCOUNTER — Ambulatory Visit (HOSPITAL_COMMUNITY)
Admission: RE | Admit: 2014-05-27 | Discharge: 2014-05-27 | Disposition: A | Discharge status: Home / Self Care | Payer: Medicare Other | Source: Ambulatory Visit | Attending: Emergency Medicine | Admitting: Emergency Medicine

## 2014-05-27 DIAGNOSIS — M7989 Other specified soft tissue disorders: Secondary | ICD-10-CM | POA: Diagnosis not present

## 2014-05-27 NOTE — Progress Notes (Signed)
VASCULAR LAB PRELIMINARY  PRELIMINARY  PRELIMINARY  PRELIMINARY  Left lower extremity venous duplex completed.    Preliminary report:  Left:  No evidence of DVT, superficial thrombosis, or Baker's cyst.  Nani Ravens, RVT 05/27/2014, 8:51 AM

## 2014-06-02 DIAGNOSIS — M549 Dorsalgia, unspecified: Secondary | ICD-10-CM | POA: Diagnosis not present

## 2014-06-23 DIAGNOSIS — M76829 Posterior tibial tendinitis, unspecified leg: Secondary | ICD-10-CM | POA: Diagnosis not present

## 2014-08-25 DIAGNOSIS — Z23 Encounter for immunization: Secondary | ICD-10-CM | POA: Diagnosis not present

## 2014-08-25 DIAGNOSIS — D649 Anemia, unspecified: Secondary | ICD-10-CM | POA: Diagnosis not present

## 2014-08-25 DIAGNOSIS — F3289 Other specified depressive episodes: Secondary | ICD-10-CM | POA: Diagnosis not present

## 2014-08-25 DIAGNOSIS — G4700 Insomnia, unspecified: Secondary | ICD-10-CM | POA: Diagnosis not present

## 2014-08-25 DIAGNOSIS — F329 Major depressive disorder, single episode, unspecified: Secondary | ICD-10-CM | POA: Diagnosis not present

## 2014-08-25 DIAGNOSIS — E785 Hyperlipidemia, unspecified: Secondary | ICD-10-CM | POA: Diagnosis not present

## 2014-08-25 DIAGNOSIS — IMO0001 Reserved for inherently not codable concepts without codable children: Secondary | ICD-10-CM | POA: Diagnosis not present

## 2014-08-25 DIAGNOSIS — E559 Vitamin D deficiency, unspecified: Secondary | ICD-10-CM | POA: Diagnosis not present

## 2014-09-17 DIAGNOSIS — J209 Acute bronchitis, unspecified: Secondary | ICD-10-CM | POA: Diagnosis not present

## 2014-09-20 ENCOUNTER — Other Ambulatory Visit: Payer: Self-pay | Admitting: Family Medicine

## 2014-09-20 ENCOUNTER — Ambulatory Visit
Admission: RE | Admit: 2014-09-20 | Discharge: 2014-09-20 | Disposition: A | Discharge status: Home / Self Care | Payer: Medicare Other | Source: Ambulatory Visit | Attending: Family Medicine | Admitting: Family Medicine

## 2014-09-20 DIAGNOSIS — R059 Cough, unspecified: Secondary | ICD-10-CM | POA: Diagnosis not present

## 2014-09-20 DIAGNOSIS — R05 Cough: Secondary | ICD-10-CM | POA: Diagnosis not present

## 2014-09-20 DIAGNOSIS — J209 Acute bronchitis, unspecified: Secondary | ICD-10-CM

## 2014-09-20 DIAGNOSIS — R509 Fever, unspecified: Secondary | ICD-10-CM | POA: Diagnosis not present

## 2014-09-23 DIAGNOSIS — M797 Fibromyalgia: Secondary | ICD-10-CM | POA: Diagnosis not present

## 2014-09-23 DIAGNOSIS — R509 Fever, unspecified: Secondary | ICD-10-CM | POA: Diagnosis not present

## 2014-09-23 DIAGNOSIS — K589 Irritable bowel syndrome without diarrhea: Secondary | ICD-10-CM | POA: Diagnosis not present

## 2014-10-29 DIAGNOSIS — S92911A Unspecified fracture of right toe(s), initial encounter for closed fracture: Secondary | ICD-10-CM | POA: Diagnosis not present

## 2014-11-03 DIAGNOSIS — E559 Vitamin D deficiency, unspecified: Secondary | ICD-10-CM | POA: Diagnosis not present

## 2014-11-25 DIAGNOSIS — M180 Bilateral primary osteoarthritis of first carpometacarpal joints: Secondary | ICD-10-CM | POA: Diagnosis not present

## 2014-11-30 ENCOUNTER — Telehealth: Payer: Self-pay | Admitting: Emergency Medicine

## 2014-11-30 NOTE — Telephone Encounter (Signed)
Patient called concerning follow up appointment with Dr Jana Hakim. Per office note on 02/04/14; patient is ok to follow up with PCP at this point and continue to have yearly mammograms and breast exams by PCP. Advised patient of this and instructed her that she should be due a mammogram in June of 2016, which Dr Tamala Julian can order and follow. Advised patient to call with any questions or concerns. Patient voiced that she has seen Dr Tamala Julian recently and that she did have a breast exam then.

## 2014-12-07 DIAGNOSIS — M545 Low back pain: Secondary | ICD-10-CM | POA: Diagnosis not present

## 2014-12-07 DIAGNOSIS — S63502A Unspecified sprain of left wrist, initial encounter: Secondary | ICD-10-CM | POA: Diagnosis not present

## 2014-12-07 DIAGNOSIS — S40012A Contusion of left shoulder, initial encounter: Secondary | ICD-10-CM | POA: Diagnosis not present

## 2014-12-10 DIAGNOSIS — Z803 Family history of malignant neoplasm of breast: Secondary | ICD-10-CM | POA: Diagnosis not present

## 2014-12-10 DIAGNOSIS — Z853 Personal history of malignant neoplasm of breast: Secondary | ICD-10-CM | POA: Diagnosis not present

## 2015-02-05 DIAGNOSIS — S40011A Contusion of right shoulder, initial encounter: Secondary | ICD-10-CM | POA: Diagnosis not present

## 2015-02-05 DIAGNOSIS — M5489 Other dorsalgia: Secondary | ICD-10-CM | POA: Diagnosis not present

## 2015-02-10 DIAGNOSIS — L814 Other melanin hyperpigmentation: Secondary | ICD-10-CM | POA: Diagnosis not present

## 2015-02-10 DIAGNOSIS — K130 Diseases of lips: Secondary | ICD-10-CM | POA: Diagnosis not present

## 2015-02-10 DIAGNOSIS — L308 Other specified dermatitis: Secondary | ICD-10-CM | POA: Diagnosis not present

## 2015-02-10 DIAGNOSIS — I788 Other diseases of capillaries: Secondary | ICD-10-CM | POA: Diagnosis not present

## 2015-02-10 DIAGNOSIS — L720 Epidermal cyst: Secondary | ICD-10-CM | POA: Diagnosis not present

## 2015-02-10 DIAGNOSIS — L821 Other seborrheic keratosis: Secondary | ICD-10-CM | POA: Diagnosis not present

## 2015-02-10 DIAGNOSIS — B078 Other viral warts: Secondary | ICD-10-CM | POA: Diagnosis not present

## 2015-02-15 DIAGNOSIS — M25552 Pain in left hip: Secondary | ICD-10-CM | POA: Diagnosis not present

## 2015-02-15 DIAGNOSIS — T148 Other injury of unspecified body region: Secondary | ICD-10-CM | POA: Diagnosis not present

## 2015-02-15 DIAGNOSIS — Z853 Personal history of malignant neoplasm of breast: Secondary | ICD-10-CM | POA: Diagnosis not present

## 2015-02-15 DIAGNOSIS — S79912A Unspecified injury of left hip, initial encounter: Secondary | ICD-10-CM | POA: Diagnosis not present

## 2015-02-15 DIAGNOSIS — X58XXXA Exposure to other specified factors, initial encounter: Secondary | ICD-10-CM | POA: Diagnosis not present

## 2015-02-15 DIAGNOSIS — S76912A Strain of unspecified muscles, fascia and tendons at thigh level, left thigh, initial encounter: Secondary | ICD-10-CM | POA: Diagnosis not present

## 2015-02-16 DIAGNOSIS — M25552 Pain in left hip: Secondary | ICD-10-CM | POA: Diagnosis not present

## 2015-02-18 ENCOUNTER — Other Ambulatory Visit: Payer: Self-pay | Admitting: Orthopedic Surgery

## 2015-02-18 DIAGNOSIS — M25552 Pain in left hip: Secondary | ICD-10-CM

## 2015-02-18 DIAGNOSIS — M25551 Pain in right hip: Secondary | ICD-10-CM

## 2015-02-19 ENCOUNTER — Other Ambulatory Visit: Payer: Medicare Other

## 2015-02-19 ENCOUNTER — Ambulatory Visit
Admission: RE | Admit: 2015-02-19 | Discharge: 2015-02-19 | Disposition: A | Discharge status: Home / Self Care | Payer: Medicare Other | Source: Ambulatory Visit | Attending: Orthopedic Surgery | Admitting: Orthopedic Surgery

## 2015-02-19 DIAGNOSIS — M25552 Pain in left hip: Secondary | ICD-10-CM

## 2015-02-19 DIAGNOSIS — S79912A Unspecified injury of left hip, initial encounter: Secondary | ICD-10-CM | POA: Diagnosis not present

## 2015-02-23 DIAGNOSIS — M25552 Pain in left hip: Secondary | ICD-10-CM | POA: Diagnosis not present

## 2015-02-24 DIAGNOSIS — R5383 Other fatigue: Secondary | ICD-10-CM | POA: Diagnosis not present

## 2015-03-30 DIAGNOSIS — Z0000 Encounter for general adult medical examination without abnormal findings: Secondary | ICD-10-CM | POA: Diagnosis not present

## 2015-03-30 DIAGNOSIS — M797 Fibromyalgia: Secondary | ICD-10-CM | POA: Diagnosis not present

## 2015-03-30 DIAGNOSIS — G4700 Insomnia, unspecified: Secondary | ICD-10-CM | POA: Diagnosis not present

## 2015-03-30 DIAGNOSIS — Z1389 Encounter for screening for other disorder: Secondary | ICD-10-CM | POA: Diagnosis not present

## 2015-03-30 DIAGNOSIS — E782 Mixed hyperlipidemia: Secondary | ICD-10-CM | POA: Diagnosis not present

## 2015-03-30 DIAGNOSIS — F329 Major depressive disorder, single episode, unspecified: Secondary | ICD-10-CM | POA: Diagnosis not present

## 2015-03-30 DIAGNOSIS — D649 Anemia, unspecified: Secondary | ICD-10-CM | POA: Diagnosis not present

## 2015-03-30 DIAGNOSIS — R339 Retention of urine, unspecified: Secondary | ICD-10-CM | POA: Diagnosis not present

## 2015-04-05 DIAGNOSIS — J0100 Acute maxillary sinusitis, unspecified: Secondary | ICD-10-CM | POA: Diagnosis not present

## 2015-04-05 DIAGNOSIS — R6889 Other general symptoms and signs: Secondary | ICD-10-CM | POA: Diagnosis not present

## 2015-04-21 DIAGNOSIS — E785 Hyperlipidemia, unspecified: Secondary | ICD-10-CM | POA: Diagnosis not present

## 2015-05-11 DIAGNOSIS — H25013 Cortical age-related cataract, bilateral: Secondary | ICD-10-CM | POA: Diagnosis not present

## 2015-05-26 DIAGNOSIS — J069 Acute upper respiratory infection, unspecified: Secondary | ICD-10-CM | POA: Diagnosis not present

## 2015-05-26 DIAGNOSIS — M549 Dorsalgia, unspecified: Secondary | ICD-10-CM | POA: Diagnosis not present

## 2015-05-26 DIAGNOSIS — R112 Nausea with vomiting, unspecified: Secondary | ICD-10-CM | POA: Diagnosis not present

## 2015-06-15 DIAGNOSIS — F411 Generalized anxiety disorder: Secondary | ICD-10-CM | POA: Diagnosis not present

## 2015-08-24 DIAGNOSIS — N359 Urethral stricture, unspecified: Secondary | ICD-10-CM | POA: Diagnosis not present

## 2015-08-24 DIAGNOSIS — R300 Dysuria: Secondary | ICD-10-CM | POA: Diagnosis not present

## 2015-09-01 DIAGNOSIS — R3912 Poor urinary stream: Secondary | ICD-10-CM | POA: Diagnosis not present

## 2015-09-01 DIAGNOSIS — R312 Other microscopic hematuria: Secondary | ICD-10-CM | POA: Diagnosis not present

## 2015-09-14 DIAGNOSIS — Z23 Encounter for immunization: Secondary | ICD-10-CM | POA: Diagnosis not present

## 2015-09-21 DIAGNOSIS — J329 Chronic sinusitis, unspecified: Secondary | ICD-10-CM | POA: Diagnosis not present

## 2015-09-21 DIAGNOSIS — R52 Pain, unspecified: Secondary | ICD-10-CM | POA: Diagnosis not present

## 2015-09-29 DIAGNOSIS — M542 Cervicalgia: Secondary | ICD-10-CM | POA: Diagnosis not present

## 2015-09-29 DIAGNOSIS — M25552 Pain in left hip: Secondary | ICD-10-CM | POA: Diagnosis not present

## 2015-09-29 DIAGNOSIS — M545 Low back pain: Secondary | ICD-10-CM | POA: Diagnosis not present

## 2015-09-29 DIAGNOSIS — M25512 Pain in left shoulder: Secondary | ICD-10-CM | POA: Diagnosis not present

## 2015-09-30 DIAGNOSIS — S060X0A Concussion without loss of consciousness, initial encounter: Secondary | ICD-10-CM | POA: Diagnosis not present

## 2015-10-05 DIAGNOSIS — G4700 Insomnia, unspecified: Secondary | ICD-10-CM | POA: Diagnosis not present

## 2015-10-05 DIAGNOSIS — R5383 Other fatigue: Secondary | ICD-10-CM | POA: Diagnosis not present

## 2015-10-05 DIAGNOSIS — M797 Fibromyalgia: Secondary | ICD-10-CM | POA: Diagnosis not present

## 2015-10-05 DIAGNOSIS — F329 Major depressive disorder, single episode, unspecified: Secondary | ICD-10-CM | POA: Diagnosis not present

## 2015-10-05 DIAGNOSIS — E785 Hyperlipidemia, unspecified: Secondary | ICD-10-CM | POA: Diagnosis not present

## 2015-12-01 DIAGNOSIS — F419 Anxiety disorder, unspecified: Secondary | ICD-10-CM | POA: Diagnosis not present

## 2015-12-01 DIAGNOSIS — K599 Functional intestinal disorder, unspecified: Secondary | ICD-10-CM | POA: Diagnosis not present

## 2015-12-17 ENCOUNTER — Ambulatory Visit (INDEPENDENT_AMBULATORY_CARE_PROVIDER_SITE_OTHER): Payer: Medicare Other | Admitting: Family Medicine

## 2015-12-17 VITALS — BP 122/80 | HR 112 | Temp 98.0°F | Resp 18 | Ht 65.5 in | Wt 167.0 lb

## 2015-12-17 DIAGNOSIS — R309 Painful micturition, unspecified: Secondary | ICD-10-CM | POA: Diagnosis not present

## 2015-12-17 DIAGNOSIS — R350 Frequency of micturition: Secondary | ICD-10-CM

## 2015-12-17 DIAGNOSIS — M791 Myalgia, unspecified site: Secondary | ICD-10-CM

## 2015-12-17 DIAGNOSIS — J0100 Acute maxillary sinusitis, unspecified: Secondary | ICD-10-CM | POA: Diagnosis not present

## 2015-12-17 DIAGNOSIS — R300 Dysuria: Secondary | ICD-10-CM | POA: Diagnosis not present

## 2015-12-17 LAB — POCT URINALYSIS DIP (MANUAL ENTRY)
NITRITE UA: POSITIVE — AB
Protein Ur, POC: >=300 — AB
Spec Grav, UA: 1.01
pH, UA: 5

## 2015-12-17 LAB — POC MICROSCOPIC URINALYSIS (UMFC)
Mucus: ABSENT
Renal tubular cells: POSITIVE

## 2015-12-17 MED ORDER — CIPROFLOXACIN HCL 250 MG PO TABS
500.0000 mg | ORAL_TABLET | Freq: Once | ORAL | Status: AC
  Administered 2015-12-17: 500 mg via ORAL

## 2015-12-17 MED ORDER — TRAMADOL HCL 50 MG PO TABS
50.0000 mg | ORAL_TABLET | Freq: Four times a day (QID) | ORAL | Status: DC | PRN
Start: 2015-12-17 — End: 2016-04-11

## 2015-12-17 MED ORDER — CIPROFLOXACIN HCL 250 MG PO TABS: 500.0000 mg | ORAL_TABLET | Freq: Two times a day (BID) | ORAL | Status: DC

## 2015-12-17 MED ORDER — CIPROFLOXACIN HCL 500 MG PO TABS: 500.0000 mg | ORAL_TABLET | Freq: Two times a day (BID) | ORAL | Status: DC

## 2015-12-17 NOTE — Patient Instructions (Signed)

## 2015-12-17 NOTE — Progress Notes (Signed)
This chart was scribed for Lori Haber, MD by [facility], medical scribe at [facility].The patient was seen in exam room 10 and the patient's care was started at 8:19 AM.  Patient ID: [name] MRN: [id number], DOB: Jul 27, 1946, 69 y.o. Date of Encounter: 12/17/2015  Primary Physician: Reginia Naas, MD  Chief Complaint:  Chief Complaint  Patient presents with   urine pain    x4 days   urine odor   Urinary Frequency   Back Pain   Sinusitis    sinus pain x3 mths    Cough   Nasal Congestion   HPI:  [name] is a 69 y.o. female who presents to [facility] due to concerns of dysuria, lower back pain, and nasal congestion. Dysuria and lower back pain began last night. Her nose is currently congested as well, this has been ongoing for over 3 months. She has been coughing for the 2-3 days. Hx of colitis and most medications make her sick. Cannot take sulfa or Augmentin. She is constipated. 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   Past Medical History  Diagnosis Date   Colitis    Hyperlipidemia    Allergy    Anemia    Depression    Anxiety    Cancer ([place]) 10/2006    Invasive ductal carcinoma of the right breast    Pneumonia    Arthritis    Tendon tear    Bursitis of hip    Drusen of both optic discs    Astigmatism    Cataracts, bilateral    Gall stones    Fibromyalgia    GERD (gastroesophageal reflux disease)    Endometriosis    Bursitis    Carpal tunnel syndrome      Home Meds: Prior to Admission medications   Medication Sig Start Date End Date Taking? Authorizing Provider  buPROPion (WELLBUTRIN XL) 300 MG 24 hr tablet Take 300 mg by mouth daily.     Yes Historical Provider, MD  cetirizine (ZYRTEC) 10 MG tablet Take 10 mg by mouth daily.     Yes Historical Provider, MD  cyclobenzaprine (FLEXERIL) 10 MG tablet Take 10 mg by mouth at bedtime.  10/24/13   Yes Historical Provider, MD  escitalopram (LEXAPRO) 20 MG tablet Take 20 mg by mouth daily.    Yes Historical Provider, MD  fluticasone (FLONASE) 50 MCG/ACT nasal spray Place 2 sprays into both nostrils daily.  10/28/13  Yes Historical Provider, MD  lansoprazole (PREVACID) 15 MG capsule Take 15 mg by mouth daily.    Yes Historical Provider, MD  LORazepam (ATIVAN) 0.5 MG tablet Take 1 mg by mouth at bedtime.  10/30/13  Yes Historical Provider, MD  ondansetron (ZOFRAN) 8 MG tablet Take 8 mg by mouth every 8 (eight) hours as needed.  09/04/13  Yes Historical Provider, MD  PREVIDENT 5000 BOOSTER PLUS 1.1 % PSTE Place 1 each onto teeth 2 (two) times daily.  09/23/13  Yes Historical Provider, MD  PROAIR HFA 108 (90 BASE) MCG/ACT inhaler Inhale 1 puff into the lungs at bedtime.  09/09/13  Yes Historical Provider, MD  simvastatin (ZOCOR) 20 MG tablet Take 20 mg by mouth at bedtime.     Yes Historical Provider, MD  traZODone (DESYREL) 50 MG tablet Take 50 mg by mouth at bedtime.  10/30/13  Yes Historical Provider, MD  acyclovir (ZOVIRAX) 200 MG capsule Take 200 mg by mouth 2 (two) times  daily. Reported on 12/17/2015 10/26/13   Historical Provider, MD  azithromycin (ZITHROMAX) 250 MG tablet Take 2 pills by mouth on day 1, then 1 pill by mouth per day on days 2 through 5. Patient not taking: Reported on 12/17/2015 11/06/13   Wendie Agreste, MD  diphenoxylate-atropine (LOMOTIL) 2.5-0.025 MG per tablet Take 1 tablet by mouth as needed. Reported on 12/17/2015 09/09/13   Historical Provider, MD  ibuprofen (ADVIL,MOTRIN) 800 MG tablet Take 800 mg by mouth every 8 (eight) hours as needed. Reported on 12/17/2015 11/10/13   Historical Provider, MD  traMADol (ULTRAM) 50 MG tablet Take 1 tablet (50 mg total) by mouth every 6 (six) hours as needed. Patient not taking: Reported on 12/17/2015 11/06/13   Wendie Agreste, MD   Allergies:  Allergies  Allergen Reactions   Augmentin [Amoxicillin-Pot Clavulanate]    Sulfa  Drugs Cross Reactors    Social History   Social History   Marital Status: Divorced    Spouse Name: N/A   Number of Children: N/A   Years of Education: N/A   Occupational History   Not on file.   Social History Main Topics   Smoking status: Former Smoker   Smokeless tobacco: Never Used   Alcohol Use: No     Comment: AA since 1989   Drug Use: No     Comment: stopped 1989   Sexual Activity: No   Other Topics Concern   Not on file   Social History Narrative    Review of Systems: Constitutional: negative for chills, fever, night sweats, weight changes, or fatigue  HEENT: negative for vision changes, hearing loss rhinorrhea, ST, epistaxis, or sinus pressure. Sinus congestion. Cardiovascular: negative for chest pain or palpitations Respiratory: negative for hemoptysis, wheezing, shortness of breath, or cough Abdominal: negative for abdominal pain, nausea, vomiting, diarrhea, or constipation Dermatological: negative for rash Genitourinary: dysuria, flank pain. Neurologic: negative for headache, dizziness, or syncope All other systems reviewed and are otherwise negative with the exception to those above and in the HPI.  Physical Exam: Blood pressure 122/80, pulse 112, temperature 98 F (36.7 C), temperature source Oral, resp. rate 18, height 5' 5.5" (1.664 m), weight 167 lb (75.751 kg), SpO2 96 %., Body mass index is 27.36 kg/(m^2). General: Well developed, well nourished, in no acute distress. Head: Normocephalic, atraumatic, eyes without discharge, sclera non-icteric, nares are without discharge. Bilateral auditory canals clear, TM's are without perforation, pearly grey and translucent with reflective cone of light bilaterally. Oral cavity moist, posterior pharynx without exudate, erythema, peritonsillar abscess. Post nasal drainage.  Neck: Supple. No thyromegaly. Full ROM. No lymphadenopathy. Lungs: Clear bilaterally to auscultation without wheezes, rales, or rhonchi.  Breathing is unlabored. Heart: RRR with S1 S2. No murmurs, rubs, or gallops appreciated. Abdomen: Soft, non-tender, non-distended with normoactive bowel sounds. No hepatomegaly. No rebound/guarding. No obvious abdominal masses. Msk:  Strength and tone normal for age. Extremities/Skin: Warm and dry. No clubbing or cyanosis. No edema. No rashes or suspicious lesions. Neuro: Alert and oriented X 3. Moves all extremities spontaneously. Gait is normal. CNII-XII grossly in tact. Psych:  Responds to questions appropriately with a normal affect.   Labs: Results for orders placed or performed in visit on 12/17/15  POCT Microscopic Urinalysis ([facility])  Result Value Ref Range   WBC,UR,HPF,POC Too numerous to count  (A) None WBC/hpf   RBC,UR,HPF,POC Too numerous to count  (A) None RBC/hpf   Bacteria Many (A) None, Too numerous to count   Mucus Absent Absent  Epithelial Cells, UR Per Microscopy Moderate (A) None, Too numerous to count cells/hpf   Renal tubular cells Positive   POCT urinalysis dipstick  Result Value Ref Range   Color, UA orange (A) yellow   Clarity, UA cloudy (A) clear   Glucose, UA =250 (A) negative   Bilirubin, UA moderate (A) negative   Ketones, POC UA small (15) (A) negative   Spec Grav, UA 1.010    Blood, UA large (A) negative   pH, UA 5.0    Protein Ur, POC >=300 (A) negative   Urobilinogen, UA >=8.0    Nitrite, UA Positive (A) Negative   Leukocytes, UA large (3+) (A) Negative   ASSESSMENT AND PLAN:  69 y.o. year old female with   By signing my name below, I, Nadim Abuhashem, attest that this documentation has been prepared under the direction and in the presence of Lori Haber, MD.  Electronically Signed: Lora Havens, medical scribe. 12/17/2015 8:29 AM.    This chart was scribed in my presence and reviewed by me personally.    ICD-9-CM ICD-10-CM   1. Pain with urination 788.1 R30.9 POCT Microscopic Urinalysis ([facility])     POCT urinalysis dipstick      ciprofloxacin (CIPRO) 500 MG tablet     Urine culture  2. Frequency of urination 788.41 R35.0 POCT Microscopic Urinalysis ([facility])     POCT urinalysis dipstick     ciprofloxacin (CIPRO) 500 MG tablet     Urine culture  3. Acute maxillary sinusitis, recurrence not specified 461.0 J01.00   4. Myalgia 729.1 M79.1 traMADol (ULTRAM) 50 MG tablet     Signed, Lori Haber, MD

## 2015-12-20 LAB — URINE CULTURE: Colony Count: >=100000

## 2016-01-11 DIAGNOSIS — R3912 Poor urinary stream: Secondary | ICD-10-CM | POA: Diagnosis not present

## 2016-01-11 DIAGNOSIS — Z0000 Encounter for general adult medical examination without abnormal findings: Secondary | ICD-10-CM | POA: Diagnosis not present

## 2016-01-17 DIAGNOSIS — K589 Irritable bowel syndrome without diarrhea: Secondary | ICD-10-CM | POA: Diagnosis not present

## 2016-01-17 DIAGNOSIS — J0110 Acute frontal sinusitis, unspecified: Secondary | ICD-10-CM | POA: Diagnosis not present

## 2016-01-25 DIAGNOSIS — H47099 Other disorders of optic nerve, not elsewhere classified, unspecified eye: Secondary | ICD-10-CM | POA: Diagnosis not present

## 2016-01-25 DIAGNOSIS — H47323 Drusen of optic disc, bilateral: Secondary | ICD-10-CM | POA: Diagnosis not present

## 2016-01-25 DIAGNOSIS — H25813 Combined forms of age-related cataract, bilateral: Secondary | ICD-10-CM | POA: Diagnosis not present

## 2016-01-30 ENCOUNTER — Encounter (HOSPITAL_COMMUNITY): Payer: Self-pay

## 2016-01-30 ENCOUNTER — Emergency Department (HOSPITAL_COMMUNITY): Payer: Medicare Other

## 2016-01-30 ENCOUNTER — Emergency Department (HOSPITAL_COMMUNITY)
Admission: EM | Admit: 2016-01-30 | Discharge: 2016-01-31 | Disposition: A | Discharge status: Home / Self Care | Payer: Medicare Other | Attending: Emergency Medicine | Admitting: Emergency Medicine

## 2016-01-30 DIAGNOSIS — T1490XA Injury, unspecified, initial encounter: Secondary | ICD-10-CM

## 2016-01-30 DIAGNOSIS — S52122A Displaced fracture of head of left radius, initial encounter for closed fracture: Secondary | ICD-10-CM | POA: Insufficient documentation

## 2016-01-30 DIAGNOSIS — Z862 Personal history of diseases of the blood and blood-forming organs and certain disorders involving the immune mechanism: Secondary | ICD-10-CM | POA: Diagnosis not present

## 2016-01-30 DIAGNOSIS — Z87891 Personal history of nicotine dependence: Secondary | ICD-10-CM | POA: Insufficient documentation

## 2016-01-30 DIAGNOSIS — Y9289 Other specified places as the place of occurrence of the external cause: Secondary | ICD-10-CM | POA: Diagnosis not present

## 2016-01-30 DIAGNOSIS — S59902A Unspecified injury of left elbow, initial encounter: Secondary | ICD-10-CM | POA: Diagnosis present

## 2016-01-30 DIAGNOSIS — S52032A Displaced fracture of olecranon process with intraarticular extension of left ulna, initial encounter for closed fracture: Secondary | ICD-10-CM | POA: Diagnosis not present

## 2016-01-30 DIAGNOSIS — Z853 Personal history of malignant neoplasm of breast: Secondary | ICD-10-CM | POA: Insufficient documentation

## 2016-01-30 DIAGNOSIS — Z8701 Personal history of pneumonia (recurrent): Secondary | ICD-10-CM | POA: Diagnosis not present

## 2016-01-30 DIAGNOSIS — W2203XA Walked into furniture, initial encounter: Secondary | ICD-10-CM | POA: Diagnosis not present

## 2016-01-30 DIAGNOSIS — S52572A Other intraarticular fracture of lower end of left radius, initial encounter for closed fracture: Secondary | ICD-10-CM | POA: Diagnosis not present

## 2016-01-30 DIAGNOSIS — Z7951 Long term (current) use of inhaled steroids: Secondary | ICD-10-CM | POA: Diagnosis not present

## 2016-01-30 DIAGNOSIS — Y998 Other external cause status: Secondary | ICD-10-CM | POA: Diagnosis not present

## 2016-01-30 DIAGNOSIS — Z79899 Other long term (current) drug therapy: Secondary | ICD-10-CM | POA: Diagnosis not present

## 2016-01-30 DIAGNOSIS — M79652 Pain in left thigh: Secondary | ICD-10-CM | POA: Diagnosis not present

## 2016-01-30 DIAGNOSIS — S42402A Unspecified fracture of lower end of left humerus, initial encounter for closed fracture: Secondary | ICD-10-CM

## 2016-01-30 DIAGNOSIS — F329 Major depressive disorder, single episode, unspecified: Secondary | ICD-10-CM | POA: Diagnosis not present

## 2016-01-30 DIAGNOSIS — Y9389 Activity, other specified: Secondary | ICD-10-CM | POA: Insufficient documentation

## 2016-01-30 DIAGNOSIS — E785 Hyperlipidemia, unspecified: Secondary | ICD-10-CM | POA: Insufficient documentation

## 2016-01-30 DIAGNOSIS — F419 Anxiety disorder, unspecified: Secondary | ICD-10-CM | POA: Diagnosis not present

## 2016-01-30 MED ORDER — HYDROMORPHONE HCL 1 MG/ML IJ SOLN
1.0000 mg | Freq: Once | INTRAMUSCULAR | Status: AC
  Administered 2016-01-31: 1 mg via INTRAVENOUS
  Filled 2016-01-30: qty 1

## 2016-01-30 MED ORDER — SODIUM CHLORIDE 0.9 % IV BOLUS (SEPSIS)
500.0000 mL | Freq: Once | INTRAVENOUS | Status: AC
  Administered 2016-01-31: 500 mL via INTRAVENOUS

## 2016-01-30 MED ORDER — FENTANYL CITRATE (PF) 100 MCG/2ML IJ SOLN
50.0000 ug | Freq: Once | INTRAMUSCULAR | Status: AC
  Administered 2016-01-30: 50 ug via NASAL

## 2016-01-30 MED ORDER — FENTANYL CITRATE (PF) 100 MCG/2ML IJ SOLN
INTRAMUSCULAR | Status: AC
  Filled 2016-01-30: qty 2

## 2016-01-30 NOTE — ED Notes (Signed)
Pt has changed her mind and wants the fentanyl.

## 2016-01-30 NOTE — ED Notes (Signed)
Pt also reports right thigh pain, no bruising noted to right thigh.

## 2016-01-30 NOTE — ED Notes (Signed)
Pt reports she tripped over a coffee table tonight around 6pm. She reports she landed on left elbow. Signigicant swelling/deformity noted to her elbow. Denies head injury or LOC.

## 2016-01-30 NOTE — ED Notes (Signed)
Pt given new ice pack. Extremity elevated. Pulses present but pt sts her arm is going numb.

## 2016-01-30 NOTE — ED Notes (Signed)
This RN offered pt the intra-nasal fentanyl but reports she cant take it because it makes her aggitated.

## 2016-01-31 ENCOUNTER — Emergency Department (HOSPITAL_COMMUNITY): Payer: Medicare Other

## 2016-01-31 DIAGNOSIS — S52122A Displaced fracture of head of left radius, initial encounter for closed fracture: Secondary | ICD-10-CM | POA: Diagnosis not present

## 2016-01-31 DIAGNOSIS — S52125A Nondisplaced fracture of head of left radius, initial encounter for closed fracture: Secondary | ICD-10-CM | POA: Diagnosis not present

## 2016-01-31 DIAGNOSIS — S52032A Displaced fracture of olecranon process with intraarticular extension of left ulna, initial encounter for closed fracture: Secondary | ICD-10-CM | POA: Diagnosis not present

## 2016-01-31 DIAGNOSIS — M79652 Pain in left thigh: Secondary | ICD-10-CM | POA: Diagnosis not present

## 2016-01-31 MED ORDER — HYDROMORPHONE HCL 1 MG/ML IJ SOLN
1.0000 mg | Freq: Once | INTRAMUSCULAR | Status: AC
  Administered 2016-01-31: 1 mg via INTRAVENOUS
  Filled 2016-01-31: qty 1

## 2016-01-31 MED ORDER — OXYCODONE-ACETAMINOPHEN 5-325 MG PO TABS: 2.0000 | ORAL_TABLET | ORAL | Status: DC | PRN

## 2016-01-31 MED ORDER — DIPHENHYDRAMINE HCL 50 MG/ML IJ SOLN
25.0000 mg | Freq: Once | INTRAMUSCULAR | Status: AC
  Administered 2016-01-31: 25 mg via INTRAVENOUS
  Filled 2016-01-31: qty 1

## 2016-01-31 MED ORDER — PREDNISONE 20 MG PO TABS
60.0000 mg | ORAL_TABLET | Freq: Once | ORAL | Status: AC
  Administered 2016-01-31: 60 mg via ORAL
  Filled 2016-01-31: qty 3

## 2016-01-31 NOTE — Progress Notes (Signed)
Orthopedic Tech Progress Note Patient Details:  [name] Oct 15, 1946 [id number]  Ortho Devices Type of Ortho Device: Arm sling, Post (long arm) splint Ortho Device/Splint Location: luue Ortho Device/Splint Interventions: Ordered, Application   Karolee Stamps 01/31/2016, 1:31 AM

## 2016-01-31 NOTE — ED Notes (Signed)
Pt stable, ambulatory, states understanding of discharge instructions 

## 2016-01-31 NOTE — ED Provider Notes (Signed)
CSN: [id number]     Arrival date & time 01/30/16  1849 History   First MD Initiated Contact with Patient 01/30/16 2315     Chief Complaint  Patient presents with  . Fall   HPI  [name] is a 70 y.o. right hand dominant female PMH significant for hyperlipidemia, depression, right breast cancer, fibromyalgia presenting status post mechanical fall tonight around 6 PM. She states she tripped over a coffee table and landed on her left elbow. She endorses left elbow pain and right thigh pain. She describes her elbow pain as 10 out of 10 pain scale, sharp, constant, nonradiating, worse with movement. She describes her right thigh pain as "more of a bruise,'' nonradiating, anterior location, 5 out of 10 pain scale, constant, worse with palpation. She denies anticoagulant use, loss of consciousness, confusion, headache, visual changes, dizziness, head injury, chest pain, abdominal pain, nausea, vomiting, loss of bowel or bladder control.  Past Medical History  Diagnosis Date  . Colitis   . Hyperlipidemia   . Allergy   . Anemia   . Depression   . Anxiety   . Cancer ([place]) 10/2006    Invasive ductal carcinoma of the right breast   . Pneumonia   . Arthritis   . Tendon tear   . Bursitis of hip   . 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 Astigmatism   . Cataracts, bilateral   . Gall stones   . Fibromyalgia   . GERD (gastroesophageal reflux disease)   . Endometriosis   . Bursitis   . Carpal tunnel syndrome    Past Surgical History  Procedure Laterality Date  . Appendectomy    . Breast surgery Right 12/11/2006    Lumpectomy  . Carpal tunnel release    . Lumbar fusion     Family History  Problem Relation Age of Onset  . Cancer Mother   . Heart disease Mother   . Cancer Father     Colon cancer  . Heart disease Father   . Heart disease Brother   . Heart disease Brother   . Vision loss Sister    Social History  Substance Use Topics  . Smoking status: Former Research scientist (life sciences)  . Smokeless tobacco:  Never Used  . Alcohol Use: No     Comment: AA since 1989   OB History    No data available     Review of Systems  Ten systems are reviewed and are negative for acute change except as noted in the HPI   Allergies  Augmentin and Sulfa drugs cross reactors  Home Medications   Prior to Admission medications   Medication Sig Start Date End Date Taking? Authorizing Provider  acyclovir (ZOVIRAX) 200 MG capsule Take 200 mg by mouth 2 (two) times daily. Reported on 12/17/2015 10/26/13   Historical Provider, MD  azithromycin (ZITHROMAX) 250 MG tablet Take 2 pills by mouth on day 1, then 1 pill by mouth per day on days 2 through 5. Patient not taking: Reported on 12/17/2015 11/06/13   Wendie Agreste, MD  buPROPion (WELLBUTRIN XL) 300 MG 24 hr tablet Take 300 mg by mouth daily.      Historical Provider, MD  cetirizine (ZYRTEC) 10 MG tablet Take 10 mg by mouth daily.      Historical Provider, MD  ciprofloxacin (CIPRO) 500 MG tablet Take 1 tablet (500 mg total) by mouth 2 (two) times daily. 12/17/15   Robyn Haber, MD  cyclobenzaprine (FLEXERIL) 10 MG tablet Take  10 mg by mouth at bedtime.  10/24/13   Historical Provider, MD  diphenoxylate-atropine (LOMOTIL) 2.5-0.025 MG per tablet Take 1 tablet by mouth as needed. Reported on 12/17/2015 09/09/13   Historical Provider, MD  escitalopram (LEXAPRO) 20 MG tablet Take 20 mg by mouth daily.     Historical Provider, MD  fluticasone (FLONASE) 50 MCG/ACT nasal spray Place 2 sprays into both nostrils daily.  10/28/13   Historical Provider, MD  ibuprofen (ADVIL,MOTRIN) 800 MG tablet Take 800 mg by mouth every 8 (eight) hours as needed. Reported on 12/17/2015 11/10/13   Historical Provider, MD  lansoprazole (PREVACID) 15 MG capsule Take 15 mg by mouth daily.     Historical Provider, MD  LORazepam (ATIVAN) 0.5 MG tablet Take 1 mg by mouth at bedtime.  10/30/13   Historical Provider, MD  ondansetron (ZOFRAN) 8 MG tablet Take 8 mg by mouth every 8 (eight) hours  as needed.  09/04/13   Historical Provider, MD  PREVIDENT 5000 BOOSTER PLUS 1.1 % PSTE Place 1 each onto teeth 2 (two) times daily.  09/23/13   Historical Provider, MD  PROAIR HFA 108 (90 BASE) MCG/ACT inhaler Inhale 1 puff into the lungs at bedtime.  09/09/13   Historical Provider, MD  simvastatin (ZOCOR) 20 MG tablet Take 20 mg by mouth at bedtime.      Historical Provider, MD  traMADol (ULTRAM) 50 MG tablet Take 1 tablet (50 mg total) by mouth every 6 (six) hours as needed. 12/17/15   Robyn Haber, MD  traZODone (DESYREL) 50 MG tablet Take 50 mg by mouth at bedtime.  10/30/13   Historical Provider, MD   BP 146/94 mmHg  Pulse 100  Temp(Src) 100 F (37.8 C) (Oral)  Resp 18  Wt 75.524 kg  SpO2 94% Physical Exam  Constitutional: She is oriented to person, place, and time. She appears well-developed and well-nourished. No distress.  HENT:  Head: Normocephalic and atraumatic.  Right Ear: External ear normal.  Left Ear: External ear normal.  Mouth/Throat: Oropharynx is clear and moist. No oropharyngeal exudate.  No hemotympanum  Eyes: Conjunctivae are normal. Pupils are equal, round, and reactive to light. Right eye exhibits no discharge. Left eye exhibits no discharge. No scleral icterus.  Neck: Normal range of motion. No tracheal deviation present.  Cardiovascular: Normal rate, regular rhythm, normal heart sounds and intact distal pulses.  Exam reveals no gallop and no friction rub.   No murmur heard. Pulmonary/Chest: Effort normal and breath sounds normal. No respiratory distress. She has no wheezes. She has no rales. She exhibits no tenderness.  Abdominal: Soft. Bowel sounds are normal. She exhibits no distension and no mass. There is no tenderness. There is no rebound and no guarding.  Musculoskeletal: She exhibits edema and tenderness.  Left elbow with deformity and 5x10cm posterior joint effusion. Decreased ROM at left elbow due to pain. No evidence of open fracture. Decreased ROM in  bilateral hands (chronic due to arthritis). Neurovascularly intact BL otherwise.  Cervical, thoracic, and lumbar spine without midline tenderness or evidence of stepoff/deformity.   Lymphadenopathy:    She has no cervical adenopathy.  Neurological: She is alert and oriented to person, place, and time. No cranial nerve deficit. Coordination normal.  Skin: Skin is warm and dry. No rash noted. She is not diaphoretic. No erythema.  Psychiatric: She has a normal mood and affect. Her behavior is normal.  Nursing note and vitals reviewed.   ED Course  Procedures  Imaging Review Dg Elbow 2 Views  Left  01/30/2016  CLINICAL DATA:  Patient fell.  Pain. EXAM: LEFT ELBOW - 2 VIEW COMPARISON:  None. FINDINGS: Intra-articular elbow fracture with posterior displacement of the olecranon process, and disruption of the olecranon fossa. Joint effusion. Small radial head fracture is superimposed (arrow, AP view). IMPRESSION: Intraarticular elbow fracture with posterior displacement of the olecranon process, and superimposed radial head fracture. Electronically Signed   By: Staci Righter M.D.   On: 01/30/2016 19:59   Dg Femur, Min 2 Views Right  01/31/2016  CLINICAL DATA:  70 year old female with right thigh pain. EXAM: RIGHT FEMUR 2 VIEWS COMPARISON:  None. FINDINGS: There is no acute fracture or dislocation. There is mild-to-moderate osteoarthritic changes of the right hip. The soft tissues appear unremarkable. IMPRESSION: No acute osseous pathology. Electronically Signed   By: Anner Crete M.D.   On: 01/31/2016 01:16   I have personally reviewed and evaluated these images and lab results as part of my medical decision-making.  MDM   Final diagnoses:  Left elbow fracture, closed, initial encounter   Patient presents status post mechanical fall. Patient is nontoxic appearing, vital signs stable. Slight tachycardia, this is most likely due to pain. Will obtain right femur and left elbow x-rays. Patient  received intranasal fentanyl prior to evaluation. Left elbow x-ray demonstrates intraarticular elbow fracture with posterior displacement of the olecranon process, and superimposed radial head fracture.  Ordered 1 mg of Dilaudid. Discussed case with Dr. Burney Gauze who advised posterior elbow splint placement and outpatient follow-up. Prior to splint placement, ortho tech informed me that patient is still in pain. Ordered another milligram of Dilaudid. Patient feels better on reassessment. RN informed me that patient is complaining of itching. There is no concern for anaphylaxis. Airway intact. Ordered Benadryl and prednisone. Patient may be safely discharged home with Percocet. Discussed return precautions. Patient understanding and agreement with the plan.  [place], PA-C 01/31/16 0221  Merryl Hacker, MD 02/01/16 [phone]

## 2016-01-31 NOTE — Discharge Instructions (Signed)
[name],  Nice meeting you! Please follow-up with Dr. Burney Gauze (information attached). Call their office when they open at 8:30 am today. Do not drive while taking percocet. I would hold off on your trazodone (desyrel), cyclobenzaprine (flexeril) while taking percocet. Return to the emergency department if you have increased pain, shortness of breath, chest pain, loss of bowel or bladder control, or you lose consciousness. Feel better soon!  S. Wendie Simmer, PA-C   Cast or Splint Care Casts and splints support injured limbs and keep bones from moving while they heal.  HOME CARE  Keep the cast or splint uncovered during the drying period.  A plaster cast can take 24 to 48 hours to dry.  A fiberglass cast will dry in less than 1 hour.  Do not rest the cast on anything harder than a pillow for 24 hours.  Do not put weight on your injured limb. Do not put pressure on the cast. Wait for your doctor's approval.  Keep the cast or splint dry.  Cover the cast or splint with a plastic bag during baths or wet weather.  If you have a cast over your chest and belly (trunk), take sponge baths until the cast is taken off.  If your cast gets wet, dry it with a towel or blow dryer. Use the cool setting on the blow dryer.  Keep your cast or splint clean. Wash a dirty cast with a damp cloth.  Do not put any objects under your cast or splint.  Do not scratch the skin under the cast with an object. If itching is a problem, use a blow dryer on a cool setting over the itchy area.  Do not trim or cut your cast.  Do not take out the padding from inside your cast.  Exercise your joints near the cast as told by your doctor.  Raise (elevate) your injured limb on 1 or 2 pillows for the first 1 to 3 days. GET HELP IF:  Your cast or splint cracks.  Your cast or splint is too tight or too loose.  You itch badly under the cast.  Your cast gets wet or has a soft spot.  You have a bad smell  coming from the cast.  You get an object stuck under the cast.  Your skin around the cast becomes red or sore.  You have new or more pain after the cast is put on. GET HELP RIGHT AWAY IF:  You have fluid leaking through the cast.  You cannot move your fingers or toes.  Your fingers or toes turn blue or white or are cool, painful, or puffy (swollen).  You have tingling or lose feeling (numbness) around the injured area.  You have bad pain or pressure under the cast.  You have trouble breathing or have shortness of breath.  You have chest pain.   This information is not intended to replace advice given to you by your health care provider. Make sure you discuss any questions you have with your health care provider.   Document Released: 04/11/2011 Document Revised: 08/12/2013 Document Reviewed: 06/18/2013 Elsevier Interactive Patient Education [employer].

## 2016-01-31 NOTE — ED Notes (Signed)
PA at bedside.

## 2016-02-03 DIAGNOSIS — S52002A Unspecified fracture of upper end of left ulna, initial encounter for closed fracture: Secondary | ICD-10-CM | POA: Diagnosis not present

## 2016-02-03 DIAGNOSIS — S52042A Displaced fracture of coronoid process of left ulna, initial encounter for closed fracture: Secondary | ICD-10-CM | POA: Diagnosis not present

## 2016-02-03 DIAGNOSIS — S52125A Nondisplaced fracture of head of left radius, initial encounter for closed fracture: Secondary | ICD-10-CM | POA: Diagnosis not present

## 2016-02-03 DIAGNOSIS — Y929 Unspecified place or not applicable: Secondary | ICD-10-CM | POA: Diagnosis not present

## 2016-02-03 DIAGNOSIS — Y999 Unspecified external cause status: Secondary | ICD-10-CM | POA: Diagnosis not present

## 2016-02-03 DIAGNOSIS — Y939 Activity, unspecified: Secondary | ICD-10-CM | POA: Diagnosis not present

## 2016-02-07 DIAGNOSIS — S52125A Nondisplaced fracture of head of left radius, initial encounter for closed fracture: Secondary | ICD-10-CM | POA: Diagnosis not present

## 2016-02-07 DIAGNOSIS — S52032D Displaced fracture of olecranon process with intraarticular extension of left ulna, subsequent encounter for closed fracture with routine healing: Secondary | ICD-10-CM | POA: Diagnosis not present

## 2016-02-07 DIAGNOSIS — S52125D Nondisplaced fracture of head of left radius, subsequent encounter for closed fracture with routine healing: Secondary | ICD-10-CM | POA: Diagnosis not present

## 2016-02-07 DIAGNOSIS — S52032A Displaced fracture of olecranon process with intraarticular extension of left ulna, initial encounter for closed fracture: Secondary | ICD-10-CM | POA: Diagnosis not present

## 2016-02-18 IMAGING — DX DG ELBOW 2V*L*
3 series · 3 of 3 positions shown · non-contrast
Comparison: None.

CLINICAL DATA: Patient fell.  Pain.

EXAM:
LEFT ELBOW - 2 VIEW

[elbow ap (1 of 2)]
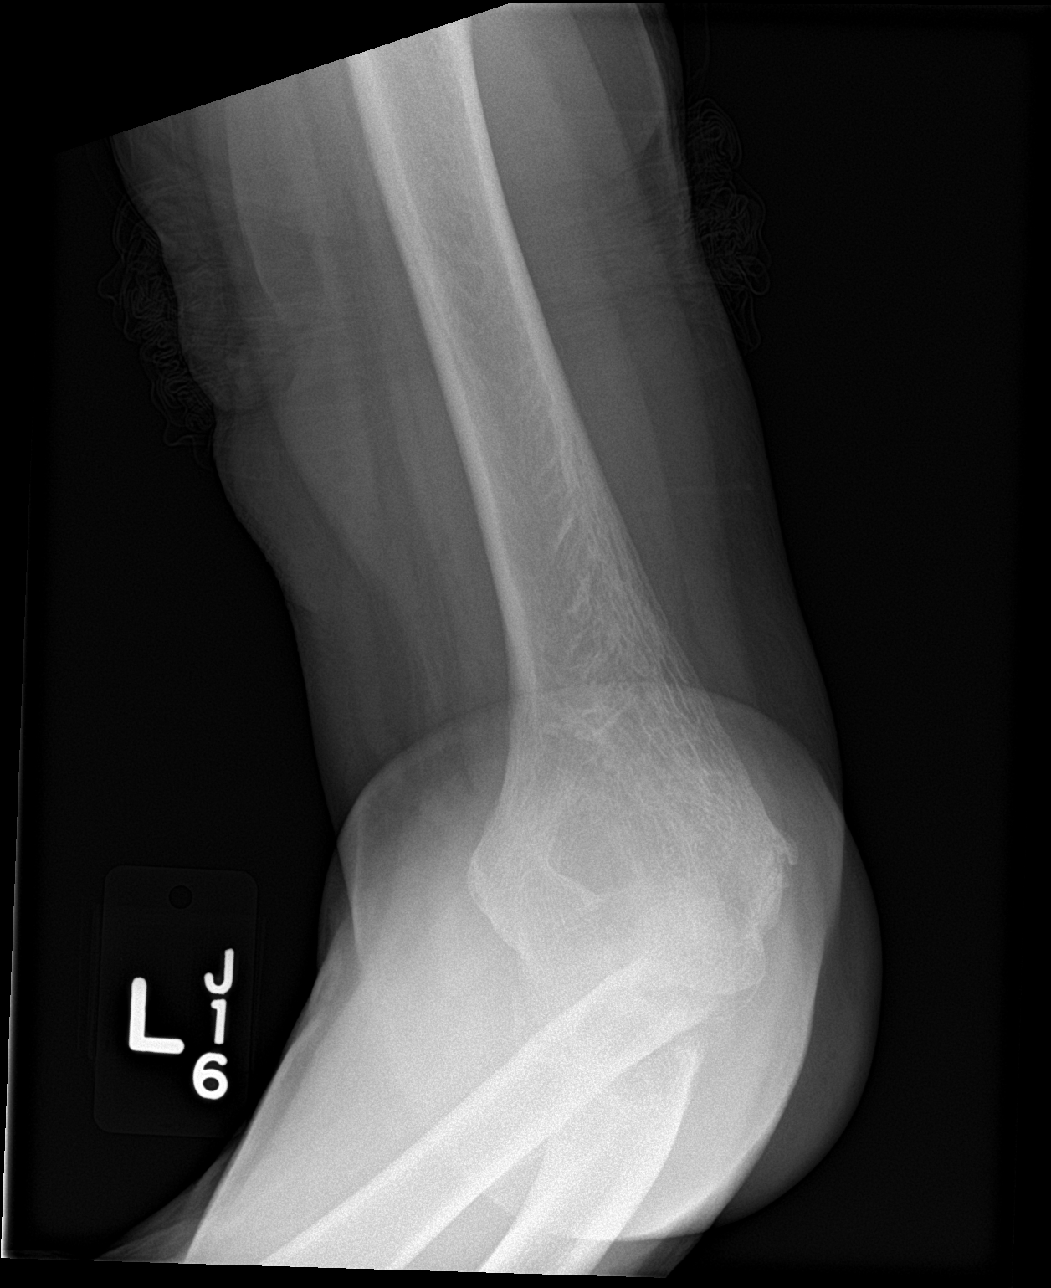

[elbow lat]
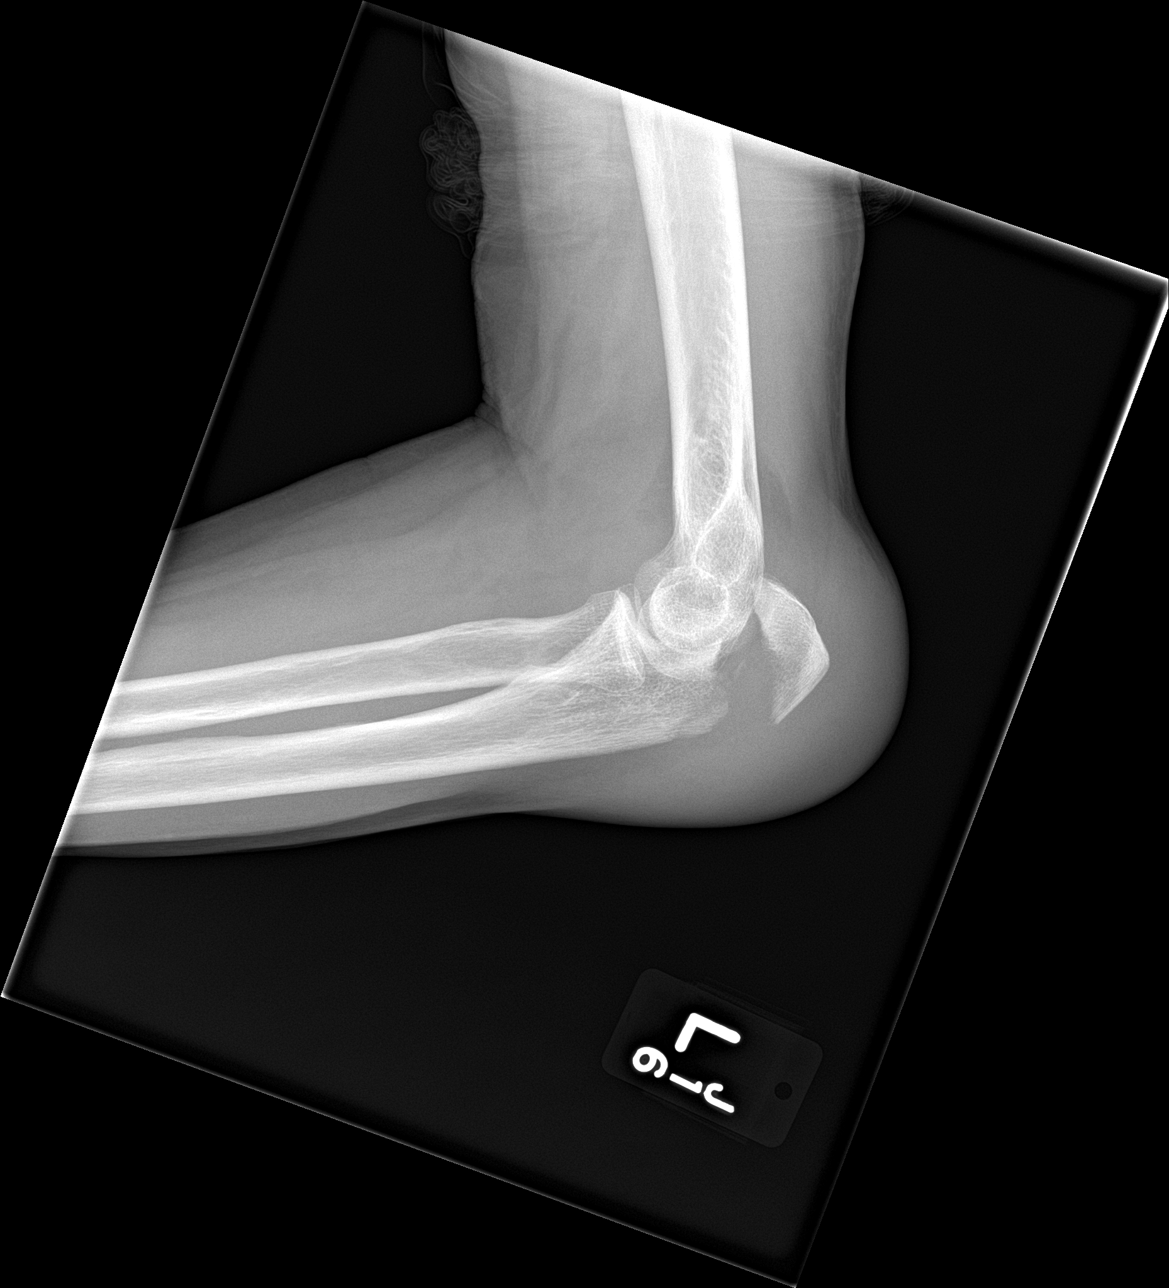

[elbow ap (2 of 2)]
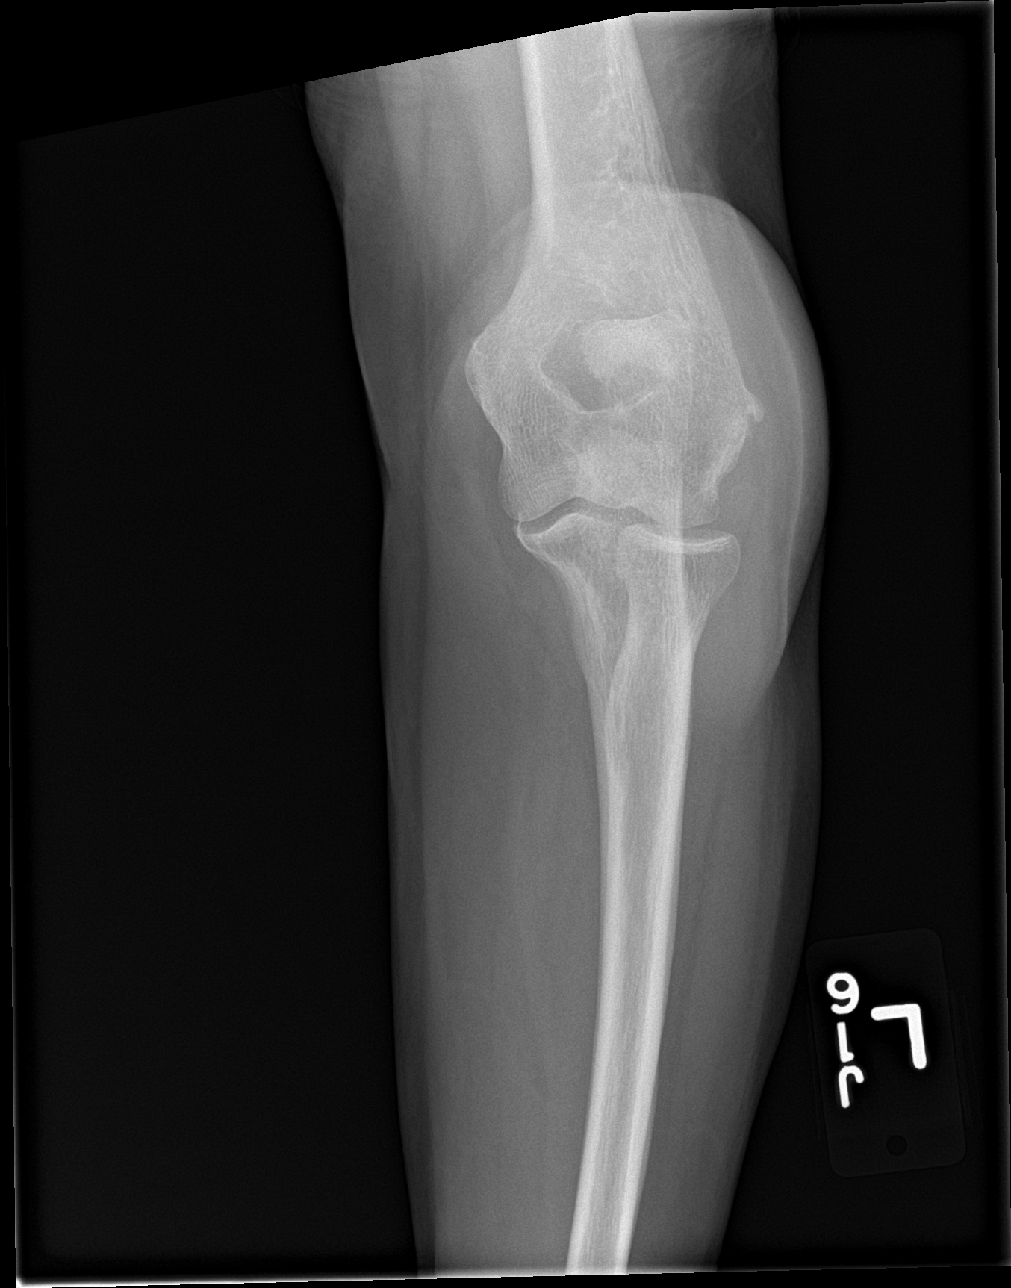

[3 of 3 positions shown; findings below may reference images not displayed]

FINDINGS: Intra-articular elbow fracture with posterior displacement of the
olecranon process, and disruption of the olecranon fossa. Joint
effusion. Small radial head fracture is superimposed (arrow, AP
view).
IMPRESSION: Intraarticular elbow fracture with posterior displacement of the
olecranon process, and superimposed radial head fracture.

## 2016-02-24 DIAGNOSIS — S52032D Displaced fracture of olecranon process with intraarticular extension of left ulna, subsequent encounter for closed fracture with routine healing: Secondary | ICD-10-CM | POA: Diagnosis not present

## 2016-02-24 DIAGNOSIS — S52125A Nondisplaced fracture of head of left radius, initial encounter for closed fracture: Secondary | ICD-10-CM | POA: Diagnosis not present

## 2016-03-15 DIAGNOSIS — S52032A Displaced fracture of olecranon process with intraarticular extension of left ulna, initial encounter for closed fracture: Secondary | ICD-10-CM | POA: Diagnosis not present

## 2016-03-15 DIAGNOSIS — S52125A Nondisplaced fracture of head of left radius, initial encounter for closed fracture: Secondary | ICD-10-CM | POA: Diagnosis not present

## 2016-03-15 DIAGNOSIS — S52032D Displaced fracture of olecranon process with intraarticular extension of left ulna, subsequent encounter for closed fracture with routine healing: Secondary | ICD-10-CM | POA: Diagnosis not present

## 2016-03-22 DIAGNOSIS — S52032D Displaced fracture of olecranon process with intraarticular extension of left ulna, subsequent encounter for closed fracture with routine healing: Secondary | ICD-10-CM | POA: Diagnosis not present

## 2016-03-27 DIAGNOSIS — K625 Hemorrhage of anus and rectum: Secondary | ICD-10-CM | POA: Diagnosis not present

## 2016-03-27 DIAGNOSIS — Z1211 Encounter for screening for malignant neoplasm of colon: Secondary | ICD-10-CM | POA: Diagnosis not present

## 2016-03-27 DIAGNOSIS — R194 Change in bowel habit: Secondary | ICD-10-CM | POA: Diagnosis not present

## 2016-03-27 DIAGNOSIS — K429 Umbilical hernia without obstruction or gangrene: Secondary | ICD-10-CM | POA: Diagnosis not present

## 2016-03-27 DIAGNOSIS — Z86010 Personal history of colonic polyps: Secondary | ICD-10-CM | POA: Diagnosis not present

## 2016-03-30 DIAGNOSIS — Z1211 Encounter for screening for malignant neoplasm of colon: Secondary | ICD-10-CM | POA: Diagnosis not present

## 2016-03-30 DIAGNOSIS — D122 Benign neoplasm of ascending colon: Secondary | ICD-10-CM | POA: Diagnosis not present

## 2016-03-30 DIAGNOSIS — Z86010 Personal history of colonic polyps: Secondary | ICD-10-CM | POA: Diagnosis not present

## 2016-03-30 DIAGNOSIS — K625 Hemorrhage of anus and rectum: Secondary | ICD-10-CM | POA: Diagnosis not present

## 2016-03-30 DIAGNOSIS — K635 Polyp of colon: Secondary | ICD-10-CM | POA: Diagnosis not present

## 2016-04-03 DIAGNOSIS — M25522 Pain in left elbow: Secondary | ICD-10-CM | POA: Diagnosis not present

## 2016-04-03 DIAGNOSIS — M25512 Pain in left shoulder: Secondary | ICD-10-CM | POA: Diagnosis not present

## 2016-04-04 DIAGNOSIS — Z1231 Encounter for screening mammogram for malignant neoplasm of breast: Secondary | ICD-10-CM | POA: Diagnosis not present

## 2016-04-04 DIAGNOSIS — Z853 Personal history of malignant neoplasm of breast: Secondary | ICD-10-CM | POA: Diagnosis not present

## 2016-04-11 ENCOUNTER — Ambulatory Visit (INDEPENDENT_AMBULATORY_CARE_PROVIDER_SITE_OTHER): Payer: Medicare Other | Admitting: Physician Assistant

## 2016-04-11 ENCOUNTER — Ambulatory Visit: Payer: Medicare Other

## 2016-04-11 ENCOUNTER — Ambulatory Visit (INDEPENDENT_AMBULATORY_CARE_PROVIDER_SITE_OTHER): Payer: Medicare Other

## 2016-04-11 VITALS — BP 118/76 | HR 91 | Temp 98.7°F | Resp 16 | Ht 65.5 in | Wt 163.0 lb

## 2016-04-11 DIAGNOSIS — M7122 Synovial cyst of popliteal space [Baker], left knee: Secondary | ICD-10-CM

## 2016-04-11 DIAGNOSIS — S060X0A Concussion without loss of consciousness, initial encounter: Secondary | ICD-10-CM

## 2016-04-11 DIAGNOSIS — M25532 Pain in left wrist: Secondary | ICD-10-CM

## 2016-04-11 DIAGNOSIS — M25562 Pain in left knee: Secondary | ICD-10-CM | POA: Diagnosis not present

## 2016-04-11 MED ORDER — TRAMADOL HCL 50 MG PO TABS: 50.0000 mg | ORAL_TABLET | Freq: Three times a day (TID) | ORAL | Status: DC | PRN

## 2016-04-11 NOTE — Progress Notes (Signed)
04/11/2016 1:46 PM   DOB: 1946/08/30 / MRN: [id number]  SUBJECTIVE:  [name] is a 70 y.o. female presenting for posterior left knee swelling that has been present for two years.  She denies pain, weakness, and tenderness about the swelling.  No difficulty with gait.   Complain of left anterior forearm pain that she describes as stabbing and intermittent. Reports her pain is worse with pushing motions, and this pain occurs roughly twice a day. She had an elbow surgery roughly two months ago for an intra-articular elbow fracture and has been calling the orthopedic surgeon's office and has not hear anything back. She does report missing a routine follow up 2 weeks ago. She has been taking tylenol with some relief.  States she hit her head one week ago when walking into an overhanging beam.  She has had no nausea or emesis since the injury. Has been having generalized HA but feels these are improving.  No changes in vision, photophobia, tinnitus.     She is allergic to augmentin and sulfa drugs cross reactors.   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    She  reports that she has quit smoking. She has never used smokeless tobacco. She reports that she does not drink alcohol or use illicit drugs. She  reports that she does not engage in sexual activity. The patient  has past surgical history that includes Appendectomy; Breast surgery (Right, 12/11/2006); Carpal tunnel release; and Lumbar fusion.  Her family history includes Cancer in her father and mother; Heart disease in her brother, brother, father, and mother; Vision loss in her sister.  Review of Systems  Constitutional: Negative  for fever and chills.  HENT: Negative for congestion, ear discharge, nosebleeds, sore throat and tinnitus.   Respiratory: Negative for cough.   Cardiovascular: Negative for chest pain.  Gastrointestinal: Negative for nausea and vomiting.  Musculoskeletal: Positive for joint pain. Negative for myalgias, back pain, falls and neck pain.  Skin: Negative for itching and rash.  Neurological: Positive for headaches. Negative for dizziness.    Problem list and medications reviewed and updated by myself where necessary, and exist elsewhere in the encounter.   OBJECTIVE:  BP 118/76 mmHg  Pulse 91  Temp(Src) 98.7 F (37.1 C) (Oral)  Resp 16  Ht 5' 5.5" (1.664 m)  Wt 163 lb (73.936 kg)  BMI 26.70 kg/m2  SpO2 96%  Physical Exam  Constitutional: She is oriented to person, place, and time. She appears well-nourished. No distress.  Eyes: EOM are normal. Pupils are equal, round, and reactive to light.  Cardiovascular: Normal rate.   Pulmonary/Chest: Effort normal.  Abdominal: She exhibits no distension.  Musculoskeletal:       Left elbow: She exhibits swelling. She exhibits normal range of motion, no effusion, no deformity and no laceration. No tenderness found. No radial head, no medial epicondyle, no lateral epicondyle and no olecranon process tenderness noted.       Legs: Neurological: She is alert and oriented to person, place, and time. She has normal strength and normal reflexes. She displays no atrophy and no tremor. No cranial nerve deficit or sensory deficit. She exhibits normal muscle tone. She displays a negative Romberg sign. She displays no seizure activity. Coordination and gait normal.  Attention  and recent and remote memory intact to challenge.    Skin: Skin is dry. She is not diaphoretic.  Psychiatric: She has a normal mood and affect.  Vitals reviewed.   No results found for this or any previous visit (from the past 72 hour(s)).  Dg Knee 1-2 Views Left  04/11/2016   CLINICAL DATA:  Left knee pain.  No known injury. EXAM: LEFT KNEE - 1-2 VIEW COMPARISON:  12/28/2010 FINDINGS: There is no evidence of fracture, dislocation, or joint effusion. There is no evidence of arthropathy or other focal bone abnormality. Soft tissues are unremarkable. IMPRESSION: Negative. Electronically Signed   By: Rolm Baptise M.D.   On: 04/11/2016 12:13    ASSESSMENT AND PLAN  Graylin was seen today for elbow pain, gait problem, head injury, dizziness, leg pain and fatigue.  Diagnoses and all orders for this visit:  Baker's cyst, left: She is asymptomatic.  I see no utility in advanced imaging.  Rads negative.   -     Cancel: DG Knee 1-2 Views Right; Future -     DG Knee 1-2 Views Left; Future  Concussion without loss of consciousness, initial encounter: He cognition and attention are excellent today.  No neurological deficits on exam.  Advised we give this more time.  Tramadol for HA.    Pain in joint, forearm, left: Secondary to intrarticular fracture s/p repair two months ago.  There is no strength or ROM deficit about the elbow.  The skin is normal. Advised she see the orthopedist office which repaired the injury for follow up.  -     traMADol (ULTRAM) 50 MG tablet; Take 1 tablet (50 mg total) by mouth every 8 (eight) hours as needed.    The patient was advised to call or return to clinic if she does not see an improvement in symptoms or to seek the care of the closest emergency department if she worsens with the above plan.   Philis Fendt, MHS, PA-C [facility] 04/11/2016 1:46 PM

## 2016-04-11 NOTE — Patient Instructions (Signed)
     IF you received an x-ray today, you will receive an invoice from [facility]. Please contact [facility] at [phone] with questions or concerns regarding your invoice.   IF you received labwork today, you will receive an invoice from [facility]. Please contact [facility] at [phone] with questions or concerns regarding your invoice.   Our billing staff will not be able to assist you with questions regarding bills from these companies.  You will be contacted with the lab results as soon as they are available. The fastest way to get your results is to activate your My Chart account. Instructions are located on the last page of this paperwork. If you have not heard from us regarding the results in 2 weeks, please contact this office.      

## 2016-04-17 DIAGNOSIS — S52125A Nondisplaced fracture of head of left radius, initial encounter for closed fracture: Secondary | ICD-10-CM | POA: Diagnosis not present

## 2016-04-17 DIAGNOSIS — S52032A Displaced fracture of olecranon process with intraarticular extension of left ulna, initial encounter for closed fracture: Secondary | ICD-10-CM | POA: Diagnosis not present

## 2016-04-17 DIAGNOSIS — S52032D Displaced fracture of olecranon process with intraarticular extension of left ulna, subsequent encounter for closed fracture with routine healing: Secondary | ICD-10-CM | POA: Diagnosis not present

## 2016-04-18 DIAGNOSIS — Z79899 Other long term (current) drug therapy: Secondary | ICD-10-CM | POA: Diagnosis not present

## 2016-04-18 DIAGNOSIS — L700 Acne vulgaris: Secondary | ICD-10-CM | POA: Diagnosis not present

## 2016-04-18 DIAGNOSIS — L814 Other melanin hyperpigmentation: Secondary | ICD-10-CM | POA: Diagnosis not present

## 2016-04-18 DIAGNOSIS — L821 Other seborrheic keratosis: Secondary | ICD-10-CM | POA: Diagnosis not present

## 2016-04-19 DIAGNOSIS — R140 Abdominal distension (gaseous): Secondary | ICD-10-CM | POA: Diagnosis not present

## 2016-04-19 DIAGNOSIS — K581 Irritable bowel syndrome with constipation: Secondary | ICD-10-CM | POA: Diagnosis not present

## 2016-04-19 DIAGNOSIS — K219 Gastro-esophageal reflux disease without esophagitis: Secondary | ICD-10-CM | POA: Diagnosis not present

## 2016-04-30 DIAGNOSIS — M8589 Other specified disorders of bone density and structure, multiple sites: Secondary | ICD-10-CM | POA: Diagnosis not present

## 2016-04-30 DIAGNOSIS — M859 Disorder of bone density and structure, unspecified: Secondary | ICD-10-CM | POA: Diagnosis not present

## 2016-05-08 DIAGNOSIS — M25552 Pain in left hip: Secondary | ICD-10-CM | POA: Diagnosis not present

## 2016-05-09 DIAGNOSIS — H52223 Regular astigmatism, bilateral: Secondary | ICD-10-CM | POA: Diagnosis not present

## 2016-05-09 DIAGNOSIS — H47323 Drusen of optic disc, bilateral: Secondary | ICD-10-CM | POA: Diagnosis not present

## 2016-05-09 DIAGNOSIS — H5203 Hypermetropia, bilateral: Secondary | ICD-10-CM | POA: Diagnosis not present

## 2016-05-09 DIAGNOSIS — H2513 Age-related nuclear cataract, bilateral: Secondary | ICD-10-CM | POA: Diagnosis not present

## 2016-05-23 DIAGNOSIS — F324 Major depressive disorder, single episode, in partial remission: Secondary | ICD-10-CM | POA: Diagnosis not present

## 2016-05-23 DIAGNOSIS — M797 Fibromyalgia: Secondary | ICD-10-CM | POA: Diagnosis not present

## 2016-05-23 DIAGNOSIS — E785 Hyperlipidemia, unspecified: Secondary | ICD-10-CM | POA: Diagnosis not present

## 2016-05-23 DIAGNOSIS — G4700 Insomnia, unspecified: Secondary | ICD-10-CM | POA: Diagnosis not present

## 2016-05-23 DIAGNOSIS — Z5181 Encounter for therapeutic drug level monitoring: Secondary | ICD-10-CM | POA: Diagnosis not present

## 2016-05-23 DIAGNOSIS — F419 Anxiety disorder, unspecified: Secondary | ICD-10-CM | POA: Diagnosis not present

## 2016-05-28 DIAGNOSIS — M25552 Pain in left hip: Secondary | ICD-10-CM | POA: Diagnosis not present

## 2016-06-01 DIAGNOSIS — E785 Hyperlipidemia, unspecified: Secondary | ICD-10-CM | POA: Diagnosis not present

## 2016-06-01 DIAGNOSIS — Z5181 Encounter for therapeutic drug level monitoring: Secondary | ICD-10-CM | POA: Diagnosis not present

## 2016-07-09 DIAGNOSIS — M19041 Primary osteoarthritis, right hand: Secondary | ICD-10-CM | POA: Diagnosis not present

## 2016-07-09 DIAGNOSIS — R05 Cough: Secondary | ICD-10-CM | POA: Diagnosis not present

## 2016-07-09 DIAGNOSIS — J069 Acute upper respiratory infection, unspecified: Secondary | ICD-10-CM | POA: Diagnosis not present

## 2016-07-16 ENCOUNTER — Ambulatory Visit (INDEPENDENT_AMBULATORY_CARE_PROVIDER_SITE_OTHER): Payer: Medicare Other | Admitting: Physician Assistant

## 2016-07-16 ENCOUNTER — Ambulatory Visit (INDEPENDENT_AMBULATORY_CARE_PROVIDER_SITE_OTHER): Payer: Medicare Other

## 2016-07-16 VITALS — BP 122/72 | HR 92 | Temp 98.9°F | Resp 17 | Ht 65.5 in | Wt 164.0 lb

## 2016-07-16 DIAGNOSIS — R05 Cough: Secondary | ICD-10-CM

## 2016-07-16 DIAGNOSIS — R059 Cough, unspecified: Secondary | ICD-10-CM

## 2016-07-16 MED ORDER — DOXYCYCLINE HYCLATE 100 MG PO TABS: 100.0000 mg | ORAL_TABLET | Freq: Two times a day (BID) | ORAL | 0 refills | Status: DC

## 2016-07-16 MED ORDER — BENZONATATE 100 MG PO CAPS: ORAL_CAPSULE | ORAL | 0 refills | Status: AC

## 2016-07-16 NOTE — Progress Notes (Signed)
[name]  MRN: [id number] DOB: 07/27/1946  Subjective:  Pt presents to clinic with illness for the last 8 days - the day prior to her symptoms pool water was splashed into her mouth and she felt like it went into her lungs when she took a breath - she started the next day with sinus pressure and chest pain - she has a cough that has a yellow color - she is currently having SOB and wheezing - she has taken mucinex and she feels like it helps with the congestion.  She will have coughing spells.  Suing her inhaler and that helps with her SOB - she is not wheezing.  In 2009 - ARDS with ventilator use 2nd to PNA  Review of Systems  Constitutional: Positive for chills and fever (low grade).  HENT: Positive for congestion. Negative for postnasal drip and sore throat.   Respiratory: Positive for cough (yellow sputum) and shortness of breath. Negative for wheezing.     Patient Active Problem List   Diagnosis Date Noted  . Breast cancer, right breast ([place]) 02/04/2014  . ANEMIA 08/22/2010  . DEPRESSION 08/22/2010  . HIATAL HERNIA 08/22/2010  . DERMATITIS 08/22/2010  . CHEST PAIN 08/22/2010  . HYPERGLYCEMIA 08/22/2010  . HYPERLIPIDEMIA 09/16/2008  . ANXIETY DEPRESSION 09/16/2008  . HX OF GALLSTONE 09/16/2008    Current Outpatient Prescriptions on File Prior to Visit  Medication Sig Dispense Refill  . buPROPion (WELLBUTRIN XL) 300 MG 24 hr tablet Take 300 mg by mouth daily.      . cetirizine (ZYRTEC) 10 MG tablet Take 10 mg by mouth daily.      . cyclobenzaprine (FLEXERIL) 10 MG tablet Take 10 mg by mouth at bedtime.     Marland Kitchen escitalopram (LEXAPRO) 20 MG tablet Take 20 mg by mouth daily.     . fluticasone (FLONASE) 50 MCG/ACT nasal spray Place 2 sprays into both nostrils daily.     . lansoprazole (PREVACID) 15 MG capsule Take 15 mg by mouth daily.     Marland Kitchen LORazepam (ATIVAN) 0.5 MG tablet Take 1 mg by mouth at bedtime.     . ondansetron (ZOFRAN) 8 MG tablet Take 8 mg by mouth every 8 (eight)  hours as needed.     Marland Kitchen PROAIR HFA 108 (90 BASE) MCG/ACT inhaler Inhale 1 puff into the lungs at bedtime.     . simvastatin (ZOCOR) 20 MG tablet Take 20 mg by mouth at bedtime.      . traZODone (DESYREL) 50 MG tablet Take 50 mg by mouth at bedtime.      No current facility-administered medications on file prior to visit.     Allergies  Allergen Reactions  . Augmentin [Amoxicillin-Pot Clavulanate]   . Sulfa Drugs Cross Reactors     Objective:  BP 122/72 (BP Location: Left Arm, Patient Position: Sitting, Cuff Size: Normal)   Pulse 92   Temp 98.9 F (37.2 C) (Oral)   Resp 17   Ht 5' 5.5" (1.664 m)   Wt 164 lb (74.4 kg)   SpO2 96%   BMI 26.88 kg/m   Physical Exam  Constitutional: She is oriented to person, place, and time and well-developed, well-nourished, and in no distress.  HENT:  Head: Normocephalic and atraumatic.  Right Ear: Hearing, tympanic membrane, external ear and ear canal normal.  Left Ear: Hearing, tympanic membrane, external ear and ear canal normal.  Nose: Nose normal.  Mouth/Throat: Uvula is midline, oropharynx is clear and moist and mucous membranes are  normal.  Eyes: Conjunctivae are normal.  Neck: Normal range of motion.  Cardiovascular: Normal rate, regular rhythm and normal heart sounds.   No murmur heard. Pulmonary/Chest: Effort normal and breath sounds normal. She has no wheezes.  Coarse breath sounds in the RML   Neurological: She is alert and oriented to person, place, and time. Gait normal.  Skin: Skin is warm and dry.  Psychiatric: Mood, memory, affect and judgment normal.  Vitals reviewed.  Dg Chest 2 View  Result Date: 07/16/2016 CLINICAL DATA:  Cough.  Pain. EXAM: CHEST  2 VIEW COMPARISON:  09/20/2014. FINDINGS: Mediastinum hilar structures normal. Low lung volumes with mild bibasilar atelectasis and/or infiltrates. No pleural effusion or pneumothorax. Degenerative changes thoracic spine. IMPRESSION: Mild bibasilar subsegmental atelectasis  and/or infiltrates. Electronically Signed   By: Marcello Moores  Register   On: 07/16/2016 12:11   Assessment and Plan :  Cough - Plan: DG Chest 2 View, doxycycline (VIBRA-TABS) 100 MG tablet, benzonatate (TESSALON) 100 MG capsule   Cover for atypical bacteria - pt to continue with her inhaler and she was encourage to take full deep breaths - she will continue her mucinex as this has been helping with her mucus production   Windell Hummingbird PA-C  [facility] 07/16/2016 12:32 PM

## 2016-07-16 NOTE — Patient Instructions (Addendum)
  Continue mucinex and drink a lot of fluids It is ok to use your inhaler 2 puffs every 4-6 hours   IF you received an x-ray today, you will receive an invoice from [facility]. Please contact [facility] at [phone] with questions or concerns regarding your invoice.   IF you received labwork today, you will receive an invoice from [facility] [employer]. Please contact [facility] at [phone] with questions or concerns regarding your invoice.   Our billing staff will not be able to assist you with questions regarding bills from these companies.  You will be contacted with the lab results as soon as they are available. The fastest way to get your results is to activate your My Chart account. Instructions are located on the last page of this paperwork. If you have not heard from [place] regarding the results in 2 weeks, please contact this office.     We recommend that you schedule a mammogram for breast cancer screening. Typically, you do not need a referral to do this. Please contact a local imaging center to schedule your mammogram.  [facility] - [phone]  *ask for the Radiology Department [facility] - [phone] or [phone]  [facility] - [phone] [place] [phone] [facility] - [phone]  *ask for the [facility] - [phone]  *ask for the Radiology Department [facility] - [phone]  *ask for the [facility] - [phone]

## 2016-08-29 DIAGNOSIS — M79642 Pain in left hand: Secondary | ICD-10-CM | POA: Diagnosis not present

## 2016-08-29 DIAGNOSIS — M79641 Pain in right hand: Secondary | ICD-10-CM | POA: Diagnosis not present

## 2016-10-03 DIAGNOSIS — R413 Other amnesia: Secondary | ICD-10-CM | POA: Diagnosis not present

## 2016-10-10 DIAGNOSIS — M25572 Pain in left ankle and joints of left foot: Secondary | ICD-10-CM | POA: Diagnosis not present

## 2016-10-12 DIAGNOSIS — R413 Other amnesia: Secondary | ICD-10-CM | POA: Diagnosis not present

## 2016-10-15 ENCOUNTER — Telehealth: Payer: Self-pay | Admitting: *Deleted

## 2016-10-15 NOTE — Telephone Encounter (Signed)
"  I'm calling for advice on hand surgery to my right hand.  I have a cyst growing in my index finger so I can't move this finger.  Surgeon wants to put a small pin in the finger.  This is the same side I had lumpectomy and three lymph nodes removed.  I think I'm ten years out and have a have history of lymphedema.  Does the oncologist think it's okay to have surgery to this hand?  Return number [id number].  Surgery is planned for October 23, 2016."

## 2016-10-15 NOTE — Telephone Encounter (Signed)
This RN spoke with pt per call and discussed her concerns.  Plan per call is for pt to verify surgeon is aware of lymph node dissection so least amount of needed restriction in circulation is incorporated.  [name] does not wear a lymphedema sleeve and this RN advised her to inquire with hand surgeon if sleeve would be unadvised - if he states sleeve is not contraindicated she should obtain for use post surgical.  If above MD does not write prescription for sleeve she may contact this office to obtain.  Note per inquiry with " history of lymphedema " - [name] states " just every now and then and it goes away "  Pt also has arthritis in hands which affect swelling.  No other needs at this time

## 2016-10-23 DIAGNOSIS — G5601 Carpal tunnel syndrome, right upper limb: Secondary | ICD-10-CM | POA: Diagnosis not present

## 2016-10-23 DIAGNOSIS — M13141 Monoarthritis, not elsewhere classified, right hand: Secondary | ICD-10-CM | POA: Diagnosis not present

## 2016-10-23 DIAGNOSIS — M25541 Pain in joints of right hand: Secondary | ICD-10-CM | POA: Diagnosis not present

## 2016-10-23 DIAGNOSIS — M71341 Other bursal cyst, right hand: Secondary | ICD-10-CM | POA: Diagnosis not present

## 2016-10-23 DIAGNOSIS — M79641 Pain in right hand: Secondary | ICD-10-CM | POA: Diagnosis not present

## 2016-10-25 DIAGNOSIS — Z4789 Encounter for other orthopedic aftercare: Secondary | ICD-10-CM | POA: Diagnosis not present

## 2016-11-07 DIAGNOSIS — M79641 Pain in right hand: Secondary | ICD-10-CM | POA: Diagnosis not present

## 2016-11-07 DIAGNOSIS — Z4789 Encounter for other orthopedic aftercare: Secondary | ICD-10-CM | POA: Diagnosis not present

## 2016-11-12 DIAGNOSIS — S52032A Displaced fracture of olecranon process with intraarticular extension of left ulna, initial encounter for closed fracture: Secondary | ICD-10-CM | POA: Diagnosis not present

## 2016-11-12 DIAGNOSIS — S52032D Displaced fracture of olecranon process with intraarticular extension of left ulna, subsequent encounter for closed fracture with routine healing: Secondary | ICD-10-CM | POA: Diagnosis not present

## 2016-11-12 DIAGNOSIS — M7022 Olecranon bursitis, left elbow: Secondary | ICD-10-CM | POA: Diagnosis not present

## 2016-11-19 DIAGNOSIS — Z4789 Encounter for other orthopedic aftercare: Secondary | ICD-10-CM | POA: Diagnosis not present

## 2016-11-19 DIAGNOSIS — M25522 Pain in left elbow: Secondary | ICD-10-CM | POA: Diagnosis not present

## 2016-11-19 DIAGNOSIS — G8929 Other chronic pain: Secondary | ICD-10-CM | POA: Diagnosis not present

## 2016-12-03 DIAGNOSIS — M79641 Pain in right hand: Secondary | ICD-10-CM | POA: Diagnosis not present

## 2016-12-03 DIAGNOSIS — Z4789 Encounter for other orthopedic aftercare: Secondary | ICD-10-CM | POA: Diagnosis not present

## 2016-12-19 DIAGNOSIS — G8929 Other chronic pain: Secondary | ICD-10-CM | POA: Diagnosis not present

## 2016-12-19 DIAGNOSIS — M79642 Pain in left hand: Secondary | ICD-10-CM | POA: Diagnosis not present

## 2016-12-19 DIAGNOSIS — M25522 Pain in left elbow: Secondary | ICD-10-CM | POA: Diagnosis not present

## 2016-12-19 DIAGNOSIS — Z4789 Encounter for other orthopedic aftercare: Secondary | ICD-10-CM | POA: Diagnosis not present

## 2017-01-07 DIAGNOSIS — M25522 Pain in left elbow: Secondary | ICD-10-CM | POA: Diagnosis not present

## 2017-01-07 DIAGNOSIS — Z4789 Encounter for other orthopedic aftercare: Secondary | ICD-10-CM | POA: Diagnosis not present

## 2017-01-07 DIAGNOSIS — G8929 Other chronic pain: Secondary | ICD-10-CM | POA: Diagnosis not present

## 2017-01-07 DIAGNOSIS — G5602 Carpal tunnel syndrome, left upper limb: Secondary | ICD-10-CM | POA: Diagnosis not present

## 2017-01-11 DIAGNOSIS — M797 Fibromyalgia: Secondary | ICD-10-CM | POA: Diagnosis not present

## 2017-01-11 DIAGNOSIS — Z1389 Encounter for screening for other disorder: Secondary | ICD-10-CM | POA: Diagnosis not present

## 2017-01-11 DIAGNOSIS — G4700 Insomnia, unspecified: Secondary | ICD-10-CM | POA: Diagnosis not present

## 2017-01-11 DIAGNOSIS — M1990 Unspecified osteoarthritis, unspecified site: Secondary | ICD-10-CM | POA: Diagnosis not present

## 2017-01-11 DIAGNOSIS — E785 Hyperlipidemia, unspecified: Secondary | ICD-10-CM | POA: Diagnosis not present

## 2017-01-11 DIAGNOSIS — M545 Low back pain: Secondary | ICD-10-CM | POA: Diagnosis not present

## 2017-01-11 DIAGNOSIS — Z0000 Encounter for general adult medical examination without abnormal findings: Secondary | ICD-10-CM | POA: Diagnosis not present

## 2017-01-11 DIAGNOSIS — F329 Major depressive disorder, single episode, unspecified: Secondary | ICD-10-CM | POA: Diagnosis not present

## 2017-01-22 DIAGNOSIS — M659 Synovitis and tenosynovitis, unspecified: Secondary | ICD-10-CM | POA: Diagnosis not present

## 2017-01-22 DIAGNOSIS — G5602 Carpal tunnel syndrome, left upper limb: Secondary | ICD-10-CM | POA: Diagnosis not present

## 2017-01-22 DIAGNOSIS — Z4789 Encounter for other orthopedic aftercare: Secondary | ICD-10-CM | POA: Diagnosis not present

## 2017-01-22 DIAGNOSIS — T84498A Other mechanical complication of other internal orthopedic devices, implants and grafts, initial encounter: Secondary | ICD-10-CM | POA: Diagnosis not present

## 2017-01-22 DIAGNOSIS — M7022 Olecranon bursitis, left elbow: Secondary | ICD-10-CM | POA: Diagnosis not present

## 2017-01-22 DIAGNOSIS — M66822 Spontaneous rupture of other tendons, left upper arm: Secondary | ICD-10-CM | POA: Diagnosis not present

## 2017-01-28 DIAGNOSIS — Z4789 Encounter for other orthopedic aftercare: Secondary | ICD-10-CM | POA: Diagnosis not present

## 2017-01-28 DIAGNOSIS — M25522 Pain in left elbow: Secondary | ICD-10-CM | POA: Diagnosis not present

## 2017-02-04 DIAGNOSIS — Z4789 Encounter for other orthopedic aftercare: Secondary | ICD-10-CM | POA: Diagnosis not present

## 2017-02-04 DIAGNOSIS — M25522 Pain in left elbow: Secondary | ICD-10-CM | POA: Diagnosis not present

## 2017-02-14 DIAGNOSIS — Z4789 Encounter for other orthopedic aftercare: Secondary | ICD-10-CM | POA: Diagnosis not present

## 2017-02-14 DIAGNOSIS — M25522 Pain in left elbow: Secondary | ICD-10-CM | POA: Diagnosis not present

## 2017-02-18 DIAGNOSIS — Z4789 Encounter for other orthopedic aftercare: Secondary | ICD-10-CM | POA: Diagnosis not present

## 2017-02-18 DIAGNOSIS — M25522 Pain in left elbow: Secondary | ICD-10-CM | POA: Diagnosis not present

## 2017-03-03 DIAGNOSIS — B338 Other specified viral diseases: Secondary | ICD-10-CM | POA: Diagnosis not present

## 2017-03-03 DIAGNOSIS — J101 Influenza due to other identified influenza virus with other respiratory manifestations: Secondary | ICD-10-CM | POA: Diagnosis not present

## 2017-03-09 ENCOUNTER — Ambulatory Visit (INDEPENDENT_AMBULATORY_CARE_PROVIDER_SITE_OTHER): Payer: Medicare Other

## 2017-03-09 ENCOUNTER — Telehealth: Payer: Self-pay | Admitting: Family Medicine

## 2017-03-09 ENCOUNTER — Ambulatory Visit (INDEPENDENT_AMBULATORY_CARE_PROVIDER_SITE_OTHER): Payer: Medicare Other | Admitting: Family Medicine

## 2017-03-09 VITALS — BP 146/84 | HR 103 | Temp 97.8°F | Resp 18 | Ht 65.5 in | Wt 155.0 lb

## 2017-03-09 DIAGNOSIS — R030 Elevated blood-pressure reading, without diagnosis of hypertension: Secondary | ICD-10-CM

## 2017-03-09 DIAGNOSIS — F329 Major depressive disorder, single episode, unspecified: Secondary | ICD-10-CM

## 2017-03-09 DIAGNOSIS — F419 Anxiety disorder, unspecified: Secondary | ICD-10-CM

## 2017-03-09 DIAGNOSIS — R0602 Shortness of breath: Secondary | ICD-10-CM

## 2017-03-09 DIAGNOSIS — R05 Cough: Secondary | ICD-10-CM | POA: Diagnosis not present

## 2017-03-09 DIAGNOSIS — F418 Other specified anxiety disorders: Secondary | ICD-10-CM | POA: Diagnosis not present

## 2017-03-09 DIAGNOSIS — I7090 Unspecified atherosclerosis: Secondary | ICD-10-CM

## 2017-03-09 MED ORDER — ALBUTEROL SULFATE HFA 108 (90 BASE) MCG/ACT IN AERS: 2.0000 | INHALATION_SPRAY | RESPIRATORY_TRACT | 1 refills | Status: DC | PRN

## 2017-03-09 MED ORDER — PREDNISONE 20 MG PO TABS: 40.0000 mg | ORAL_TABLET | Freq: Every day | ORAL | 0 refills | Status: DC

## 2017-03-09 MED ORDER — ALBUTEROL SULFATE (2.5 MG/3ML) 0.083% IN NEBU
2.5000 mg | INHALATION_SOLUTION | Freq: Once | RESPIRATORY_TRACT | Status: AC
  Administered 2017-03-09: 2.5 mg via RESPIRATORY_TRACT

## 2017-03-09 MED ORDER — IPRATROPIUM BROMIDE 0.02 % IN SOLN
0.5000 mg | Freq: Once | RESPIRATORY_TRACT | Status: AC
  Administered 2017-03-09: 0.5 mg via RESPIRATORY_TRACT

## 2017-03-09 NOTE — Telephone Encounter (Signed)
Pt says she has had the flu since 02/27/17. She started taking Tamiflu around 03/03/17. Pt would like to know if she is still contagious. She does not have a fever. Best callback number is [phone]. Pt stated it is okay to leave vm.

## 2017-03-09 NOTE — Patient Instructions (Addendum)
Take prednisone 40 mg x 5 days for shortness of breath.  Increase albuterol inhaler 2 puffs every 4-6 hours as needed for shortness of breath.  Increase Trazodone from 50 mg a bedtime to 50 mg twice daily until you are able to pick-up your Ativan on Monday. This medication is indicated for anxiety and panic related symptoms.  Elevated blood pressure-return in 1 week for blood pressure recheck  Start 81 mg daily Aspirin for atherosclerosis    IF you received an x-ray today, you will receive an invoice from [facility]. Please contact [facility] at [phone] with questions or concerns regarding your invoice.   IF you received labwork today, you will receive an invoice from [facility]. Please contact [facility] at [phone] with questions or concerns regarding your invoice.   Our billing staff will not be able to assist you with questions regarding bills from these companies.  You will be contacted with the lab results as soon as they are available. The fastest way to get your results is to activate your My Chart account. Instructions are located on the last page of this paperwork. If you have not heard from [place] regarding the results in 2 weeks, please contact this office.      Atherosclerosis Atherosclerosis is narrowing and hardening of the blood vessels (arteries). Arteries are tubes that carry blood that contains oxygen from the heart to all parts of the body. Arteries can become narrow or clogged with a buildup of fat, cholesterol, calcium, or other substances (plaque). Plaque decreases the amount of blood that can flow through the artery. Atherosclerosis can affect any artery in the body, including:  Heart arteries (coronary artery disease), which may cause heart attack.  Brain arteries, which may cause stroke.  Leg, arm, and pelvis arteries (peripheral artery disease), which may cause pain and numbness.  Kidney arteries, which may cause kidney (renal)  failure. Treatment may slow the disease and prevent further damage to the heart, brain, peripheral arteries, and kidneys. What are the causes? Atherosclerosis develops when plaque forms in an artery. This damages the inside wall of the artery. Over time, the plaque grows and hardens. It may break through the artery wall. This causes a blood clot to form over the break, which narrows the artery more. The clot may also break loose and travel to other arteries, causing more damage. What increases the risk? This condition is more likely to develop in people who:  Are middle age or older.  Have a family history of atherosclerosis.  Have high cholesterol.  Have high blood fats (triglycerides).  Have diabetes.  Are overweight.  Smoke tobacco.  Do not exercise enough.  Have a substance in the blood that indicates increased levels of inflammation in the body (C-reactive protein, or CRP).  Have sleep apnea.  Are stressed.  Drink too much alcohol. What are the signs or symptoms? This condition may not cause any symptoms. If you do have symptoms, they are caused by damage to an area of your body that is not getting enough blood. The following symptoms are possible:  Coronary artery disease may cause chest pain and shortness of breath.  Decreased blood supply to your brain may cause a stroke. Signs and symptoms of stroke may include sudden:  Weakness on one side of the body.  Confusion.  Changes in vision.  Inability to speak or understand speech.  Loss of balance, coordination, or ability to walk.  Severe headache.  Loss of consciousness.  Peripheral artery disease may  cause pain and numbness, often in the legs and hips.  Renal failure may cause fatigue, nausea, swelling, and itchy skin. How is this diagnosed? This condition is diagnosed based on your medical history and a physical exam. During the exam, your health care provider will check your pulses and listen for a  "whooshing" sound over your arteries (bruit). You may have tests, such as:  Blood tests to check your levels of cholesterol, triglycerides, and CRP.  Electrocardiogram (ECG) to check for heart damage.  Chest X-ray to see if your heart is enlarged, which is a sign of heart failure.  Stress test to see how your heart reacts to exercise.  Echocardiogram to get images of the inside of your heart.  Ankle-Brachial index to compare blood pressure in your arms to blood pressure in your ankles.  Ultrasound of your peripheral arteries to check blood flow.  CT scan to check for damage to your heart or brain.  X-rays of blood vessels after dye has been injected (angiogram) to check blood flow. How is this treated? Treatment starts with lifestyle changes, which may include:  Changing your diet.  Losing weight.  Reducing stress.  Exercising and being more physically active.  Not smoking. You also may need medicine to:  Lower triglycerides and cholesterol.  Lower and control blood pressure.  Prevent blood clots.  Lower inflammation in your body.  Lower and control your blood sugar. Sometimes, surgery is needed to remove plaque, widen your artery, or create a new path for your blood (bypass). Surgical treatment may include:  Removing plaque from an artery (endarterectomy).  Opening a narrowed heart artery (angioplasty).  Heart or peripheral artery bypass graft surgery. Follow these instructions at home: Lifestyle    Eat a heart-healthy diet. Talk to your health care provider or a diet specialist (dietitian) if you need help. A heart-healthy diet includes:  Limiting unhealthy fats and increasing healthy fats. Some examples of healthy fats are olive oil and canola oil.  Eating plant-based foods, such as fruits, vegetables, nuts, legumes, and whole grains.  Follow an exercise program as told by your health care provider.  Maintain a healthy weight. Lose weight if directed by  your health care provider.  Rest when you are tired.  Learn to manage your stress.  Do not use any tobacco products, such as cigarettes, chewing tobacco, and e-cigarettes. If you need help quitting, ask your health care provider.  Limit alcohol intake to no more than 1 drink a day for nonpregnant women and 2 drinks a day for men. One drink equals 12 oz of beer, 5 oz of wine, or 1 oz of hard liquor.  Do not abuse drugs. General instructions   Take over-the-counter and prescription medicines only as told by your health care provider.  Manage other health conditions as told by your health care provider.  Keep all follow-up visits as told by your health care provider. This is important. Contact a health care provider if:  You have chest pain or discomfort. This includes squeezing chest pain that may feel like indigestion (angina).  You have shortness of breath.  You have an irregular heartbeat.  You have unexplained fatigue.  You have unexplained pain or numbness in an arm, leg, or hip.  You have nausea, swelling of your hands or feet, and itchy skin. Get help right away if:  You have symptoms of a heart attack, such as:  Chest pain.  Shortness of breath.  Pain in your neck, jaw, arms,  back, or stomach.  Cold sweat.  Nausea.  Light-headedness.  You have symptoms of a stroke, such as sudden:  Weakness on one side of your body.  Confusion.  Changes in vision.  Inability to speak or understand speech.  Loss of balance, coordination, or ability to walk.  Severe headache.  Loss of consciousness. These symptoms may represent a serious problem that is an emergency. Do not wait to see if the symptoms will go away. Get medical help right away. Call your local emergency services (911 in the [place] Do not drive yourself to the hospital. This information is not intended to replace advice given to you by your health care provider. Make sure you discuss any questions you  have with your health care provider. Document Released: 03/01/2004 Document Revised: 05/17/2016 Document Reviewed: 10/31/2015 Elsevier Interactive Patient Education  2017 [place] of Breath, Adult Shortness of breath means you have trouble breathing. Your lungs are organs for breathing. Follow these instructions at home: Pay attention to any changes in your symptoms. Take these actions to help with your condition:  Do not smoke. Smoking can cause shortness of breath. If you need help to quit smoking, ask your doctor.  Avoid things that can make it harder to breathe, such as:  Mold.  Dust.  Air pollution.  Chemical smells.  Things that can cause allergy symptoms (allergens), if you have allergies.  Keep your living space clean and free of mold and dust.  Rest as needed. Slowly return to your usual activities.  Take over-the-counter and prescription medicines, including oxygen and inhaled medicines, only as told by your doctor.  Keep all follow-up visits as told by your doctor. This is important. Contact a doctor if:  Your condition does not get better as soon as expected.  You have a hard time doing your normal activities, even after you rest.  You have new symptoms. Get help right away if:  You have trouble breathing when you are resting.  You feel light-headed or you faint.  You have a cough that is not helped by medicines.  You cough up blood.  You have pain with breathing.  You have pain in your chest, arms, shoulders, or belly (abdomen).  You have a fever.  You cannot walk up stairs.  You cannot exercise the way you normally do. This information is not intended to replace advice given to you by your health care provider. Make sure you discuss any questions you have with your health care provider. Document Released: 05/28/2008 Document Revised: 12/27/2016 Document Reviewed: 12/27/2016 Elsevier Interactive Patient Education  2017  [employer].
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
## 2017-03-13 NOTE — Telephone Encounter (Signed)
Not contagious if no fever or cough after 24 hours, pt advised

## 2017-03-14 DIAGNOSIS — Z4789 Encounter for other orthopedic aftercare: Secondary | ICD-10-CM | POA: Diagnosis not present

## 2017-03-14 DIAGNOSIS — G8929 Other chronic pain: Secondary | ICD-10-CM | POA: Diagnosis not present

## 2017-03-14 DIAGNOSIS — M25522 Pain in left elbow: Secondary | ICD-10-CM | POA: Diagnosis not present

## 2017-03-14 DIAGNOSIS — G5602 Carpal tunnel syndrome, left upper limb: Secondary | ICD-10-CM | POA: Diagnosis not present

## 2017-03-15 ENCOUNTER — Ambulatory Visit (INDEPENDENT_AMBULATORY_CARE_PROVIDER_SITE_OTHER): Payer: Medicare Other | Admitting: Physician Assistant

## 2017-03-15 VITALS — BP 129/76 | HR 94 | Temp 99.3°F | Resp 17 | Ht 65.5 in | Wt 160.0 lb

## 2017-03-15 DIAGNOSIS — R3912 Poor urinary stream: Secondary | ICD-10-CM | POA: Diagnosis not present

## 2017-03-15 DIAGNOSIS — J0100 Acute maxillary sinusitis, unspecified: Secondary | ICD-10-CM

## 2017-03-15 DIAGNOSIS — R350 Frequency of micturition: Secondary | ICD-10-CM | POA: Diagnosis not present

## 2017-03-15 MED ORDER — DOXYCYCLINE HYCLATE 100 MG PO CAPS: 100.0000 mg | ORAL_CAPSULE | Freq: Two times a day (BID) | ORAL | 0 refills | Status: AC

## 2017-03-15 NOTE — Patient Instructions (Addendum)
Take antibiotics as prescribed. Avoid dairy products 2 hours after you take the tablets. This a very good antibiotic for sinus infections. Please let me know if you are not better in 7-10 days or you get worse. Continue your other medications. Thank you for letting me participate in your health and well being.   Sinusitis, Adult Sinusitis is soreness and inflammation of your sinuses. Sinuses are hollow spaces in the bones around your face. They are located:  Around your eyes.  In the middle of your forehead.  Behind your nose.  In your cheekbones. Your sinuses and nasal passages are lined with a stringy fluid (mucus). Mucus normally drains out of your sinuses. When your nasal tissues get inflamed or swollen, the mucus can get trapped or blocked so air cannot flow through your sinuses. This lets bacteria, viruses, and funguses grow, and that leads to infection. Follow these instructions at home: Medicines   Take, use, or apply over-the-counter and prescription medicines only as told by your doctor. These may include nasal sprays.  If you were prescribed an antibiotic medicine, take it as told by your doctor. Do not stop taking the antibiotic even if you start to feel better. Hydrate and Humidify   Drink enough water to keep your pee (urine) clear or pale yellow.  Use a cool mist humidifier to keep the humidity level in your home above 50%.  Breathe in steam for 10-15 minutes, 3-4 times a day or as told by your doctor. You can do this in the bathroom while a hot shower is running.  Try not to spend time in cool or dry air. Rest   Rest as much as possible.  Sleep with your head raised (elevated).  Make sure to get enough sleep each night. General instructions   Put a warm, moist washcloth on your face 3-4 times a day or as told by your doctor. This will help with discomfort.  Wash your hands often with soap and water. If there is no soap and water, use hand sanitizer.  Do not  smoke. Avoid being around people who are smoking (secondhand smoke).  Keep all follow-up visits as told by your doctor. This is important. Contact a doctor if:  You have a fever.  Your symptoms get worse.  Your symptoms do not get better within 10 days. Get help right away if:  You have a very bad headache.  You cannot stop throwing up (vomiting).  You have pain or swelling around your face or eyes.  You have trouble seeing.  You feel confused.  Your neck is stiff.  You have trouble breathing. This information is not intended to replace advice given to you by your health care provider. Make sure you discuss any questions you have with your health care provider. Document Released: 05/28/2008 Document Revised: 08/05/2016 Document Reviewed: 10/05/2015 Elsevier Interactive Patient Education  2017 [employer].   IF you received an x-ray today, you will receive an invoice from [facility]. Please contact [facility] at [phone] with questions or concerns regarding your invoice.   IF you received labwork today, you will receive an invoice from [place]. Please contact [facility] at [phone] with questions or concerns regarding your invoice.   Our billing staff will not be able to assist you with questions regarding bills from these companies.  You will be contacted with the lab results as soon as they are available. The fastest way to get your results is to activate your My Chart account. Instructions  are located on the last page of this paperwork. If you have not heard from [place] regarding the results in 2 weeks, please contact this office.

## 2017-03-15 NOTE — Progress Notes (Signed)
MRN: [id number] DOB: [birth year]  Subjective:   [name] is a 71 y.o. female presenting for chief complaint of Headache and Sinusitis .  Reports 3 week history of sinus congestion, sinus pain, sinus headache. She is still having intermittent subjective fever. Was diagnosed with the flu at the first of March 2017 and says she just has not felt the same since but now the sinus pressure is getting worse. Has tried advil, tylenol, mucinex, and zyrtec with mild relief. Has prednisone taper on 03/09/17 for SOB and chest tightness and notes that helped with the sinsu pressure but then it returned. Denies fever, ear pain, wheezing, shortness of breath, myalgia and cough, chills, nausea, vomiting, abdominal pain and diarrhea.  Has history of seasonal allergies, no history of asthma. Denies smoking. Denies any other aggravating or relieving factors, no other questions or concerns.  [name] has a current medication list which includes the following prescription(s): albuterol, bupropion, cetirizine, cyclobenzaprine, escitalopram, fluticasone, lansoprazole, lorazepam, ondansetron, prednisone, simvastatin, and trazodone. Also is allergic to augmentin [amoxicillin-pot clavulanate] and sulfa drugs cross reactors.  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 Also  has a past surgical history that includes Appendectomy; Breast surgery (Right, 12/11/2006); Carpal tunnel release; and Lumbar fusion.   Objective:   Vitals: BP 129/76   Pulse 94   Temp 99.3 F (37.4 C) (Oral)   Resp 17   Ht 5' 5.5" (1.664 m)   Wt 160 lb (72.6 kg)   SpO2 97%   BMI 26.22 kg/m   Physical Exam  Constitutional: She is  oriented to person, place, and time. She appears well-developed and well-nourished. No distress.  HENT:  Head: Normocephalic and atraumatic.  Right Ear: Tympanic membrane, external ear and ear canal normal.  Left Ear: Tympanic membrane, external ear and ear canal normal.  Nose: Mucosal edema (mild) present. No rhinorrhea. Right sinus exhibits maxillary sinus tenderness. Right sinus exhibits no frontal sinus tenderness. Left sinus exhibits maxillary sinus tenderness. Left sinus exhibits no frontal sinus tenderness.  Mouth/Throat: Uvula is midline, oropharynx is clear and moist and mucous membranes are normal. No tonsillar exudate.  Eyes: Conjunctivae are normal.  Neck: Normal range of motion.  Cardiovascular: Normal rate, regular rhythm and normal heart sounds.   Pulmonary/Chest: Effort normal and breath sounds normal. She has no wheezes. She has no rales.  Lymphadenopathy:       Head (right side): No submental, no submandibular, no tonsillar, no preauricular, no posterior auricular and no occipital adenopathy present.       Head (left side): No submental, no submandibular, no tonsillar, no preauricular, no posterior auricular and no occipital adenopathy present.    She has no cervical adenopathy.       Right: No supraclavicular adenopathy present.       Left: No supraclavicular adenopathy present.  Neurological: She is alert and oriented to person, place, and time.  Skin: Skin is warm and dry.  Psychiatric: She has a normal mood and affect.  Vitals reviewed.  No results found for this or any previous visit (from the past 24 hour(s)).  Assessment and Plan :  1. Acute maxillary sinusitis, recurrence not specified Concerned for double sickening s/p influenza illness. Will treat with doxy due to allergy to augmentin. Instructed to return to  clinic if symptoms worsen, do not improve in 7-10 days, or as needed - doxycycline (VIBRAMYCIN) 100 MG capsule; Take 1 capsule (100 mg total) by mouth 2  (two) times daily.  Dispense: 14 capsule; Refill: 0  Tenna Delaine, PA-C  [facility] 03/15/2017 12:29 PM

## 2017-03-16 ENCOUNTER — Ambulatory Visit (INDEPENDENT_AMBULATORY_CARE_PROVIDER_SITE_OTHER): Payer: Medicare Other | Admitting: Family Medicine

## 2017-03-16 VITALS — BP 130/76 | HR 102 | Temp 99.8°F | Resp 16 | Ht 64.5 in | Wt 157.8 lb

## 2017-03-16 DIAGNOSIS — R21 Rash and other nonspecific skin eruption: Secondary | ICD-10-CM

## 2017-03-16 MED ORDER — DESONIDE 0.05 % EX CREA: TOPICAL_CREAM | Freq: Two times a day (BID) | CUTANEOUS | 0 refills | Status: DC

## 2017-03-16 MED ORDER — VALACYCLOVIR HCL 1 G PO TABS: 1000.0000 mg | ORAL_TABLET | Freq: Three times a day (TID) | ORAL | 0 refills | Status: DC

## 2017-03-16 NOTE — Progress Notes (Signed)
Cream not covered by insurance triamcinolone low dose bid substituted with MD permission

## 2017-03-16 NOTE — Patient Instructions (Addendum)
It was good to meet you today.  Take the Valtrex 1 pill 3 times a day, breakfast lunch and dinner, for the next 7 days. This will help the rash clear up if it is shingles.  Use the desonide cream on the spot on your stomach. That will make that go as well.    IF you received an x-ray today, you will receive an invoice from [facility]. Please contact [facility] at [phone] with questions or concerns regarding your invoice.   IF you received labwork today, you will receive an invoice from [place]. Please contact [facility] at [phone] with questions or concerns regarding your invoice.   Our billing staff will not be able to assist you with questions regarding bills from these companies.  You will be contacted with the lab results as soon as they are available. The fastest way to get your results is to activate your My Chart account. Instructions are located on the last page of this paperwork. If you have not heard from [place] regarding the results in 2 weeks, please contact this office.

## 2017-03-16 NOTE — Progress Notes (Signed)
[name] is a 71 y.o. female who presents to Primary Care at [facility] today for Rash:  1.  Rash:  Left side and abdomen.  Left-sided linear rash around fourth rib area. Patient states this started about 1 day ago. Describes sharp stabbing pain with some tingling in the area. She is concerned for shingles. She's never had a rash like this before.  She hasn't tried anything for relief.  No creams at home. No injury to scratching to area.    Also with rash on her stomach.  States this itches.  Hasn't tried anything for relief here Either. Present for the past 2 days.  ROS as above.    PMH reviewed. Patient is a nonsmoker.   Past Medical History:  Diagnosis Date  . Allergy   . Anemia   . Anxiety   . Arthritis   . Astigmatism   . Bursitis   . Bursitis of hip   . Cancer ([place]) 10/2006   Invasive ductal carcinoma of the right breast   . Carpal tunnel syndrome   . Cataracts, bilateral   . Colitis   . Depression   . 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 Endometriosis   . Fibromyalgia   . Gall stones   . GERD (gastroesophageal reflux disease)   . Hyperlipidemia   . Pneumonia   . Tendon tear    Past Surgical History:  Procedure Laterality Date  . APPENDECTOMY    . BREAST SURGERY Right 12/11/2006   Lumpectomy  . CARPAL TUNNEL RELEASE    . LUMBAR FUSION      Medications reviewed. Current Outpatient Prescriptions  Medication Sig Dispense Refill  . albuterol (PROAIR HFA) 108 (90 Base) MCG/ACT inhaler Inhale 2 puffs into the lungs every 4 (four) hours as needed for wheezing or shortness of breath. 1 Inhaler 1  . buPROPion (WELLBUTRIN XL) 300 MG 24 hr tablet Take 300 mg by mouth daily.      . cetirizine (ZYRTEC) 10 MG tablet Take 10 mg by mouth daily.      . cyclobenzaprine (FLEXERIL) 10 MG tablet Take 10 mg by mouth at bedtime.     Marland Kitchen doxycycline (VIBRAMYCIN) 100 MG capsule Take 1 capsule (100 mg total) by mouth 2 (two) times daily. 14 capsule 0  . escitalopram (LEXAPRO) 20 MG tablet  Take 20 mg by mouth daily.     . fluticasone (FLONASE) 50 MCG/ACT nasal spray Place 2 sprays into both nostrils daily.     . lansoprazole (PREVACID) 15 MG capsule Take 15 mg by mouth daily.     Marland Kitchen LORazepam (ATIVAN) 0.5 MG tablet Take 1 mg by mouth at bedtime.     . ondansetron (ZOFRAN) 8 MG tablet Take 8 mg by mouth every 8 (eight) hours as needed.     . simvastatin (ZOCOR) 20 MG tablet Take 20 mg by mouth at bedtime.      . traZODone (DESYREL) 50 MG tablet Take 50 mg by mouth at bedtime.     . predniSONE (DELTASONE) 20 MG tablet Take 2 tablets (40 mg total) by mouth daily with breakfast. (Patient not taking: Reported on 03/16/2017) 10 tablet 0   No current facility-administered medications for this visit.      Physical Exam:  BP 130/76   Pulse (!) 102   Temp 99.8 F (37.7 C) (Oral)   Resp 16   Ht 5' 4.5" (1.638 m)   Wt 157 lb 12.8 oz (71.6 kg)   SpO2 94%  BMI 26.67 kg/m  Gen:  Alert, cooperative patient who appears stated age in no acute distress.  Vital signs reviewed. HEENT: EOMI,  MMM Skin: Erythematous eczematous patch but to send me some diameter on the ventral portion of her abdomen to lateral to her umbilicus.  Also with linear rash just posterior to mid-axillary line with some vesicles.  Only about 2 cm in length  Assessment and Plan:  1.  Shingles: - concern for shingles in this patient with linear rash on back - no abrasions or injuries - will treat as such.  Valtrex x 7 day.   2.  Eczematous rash on stomach: - Desonide to treat. - not shingles

## 2017-03-18 ENCOUNTER — Ambulatory Visit: Payer: PRIVATE HEALTH INSURANCE

## 2017-03-18 DIAGNOSIS — G8929 Other chronic pain: Secondary | ICD-10-CM | POA: Diagnosis not present

## 2017-03-18 DIAGNOSIS — M545 Low back pain: Secondary | ICD-10-CM | POA: Diagnosis not present

## 2017-03-18 NOTE — Telephone Encounter (Signed)
DESONIDE NOT COVERED, PREFERRED IS DETHAMTHASONE CREAM

## 2017-03-20 DIAGNOSIS — S40812A Abrasion of left upper arm, initial encounter: Secondary | ICD-10-CM | POA: Diagnosis not present

## 2017-03-28 DIAGNOSIS — L0101 Non-bullous impetigo: Secondary | ICD-10-CM | POA: Diagnosis not present

## 2017-03-28 DIAGNOSIS — L0889 Other specified local infections of the skin and subcutaneous tissue: Secondary | ICD-10-CM | POA: Diagnosis not present

## 2017-03-28 IMAGING — DX DG CHEST 2V
2 series · 2 of 2 positions shown · non-contrast
Comparison: 07/16/2016

CLINICAL DATA: Shortness of breath 10 days and cough.

EXAM:
CHEST  2 VIEW

[chest pa]
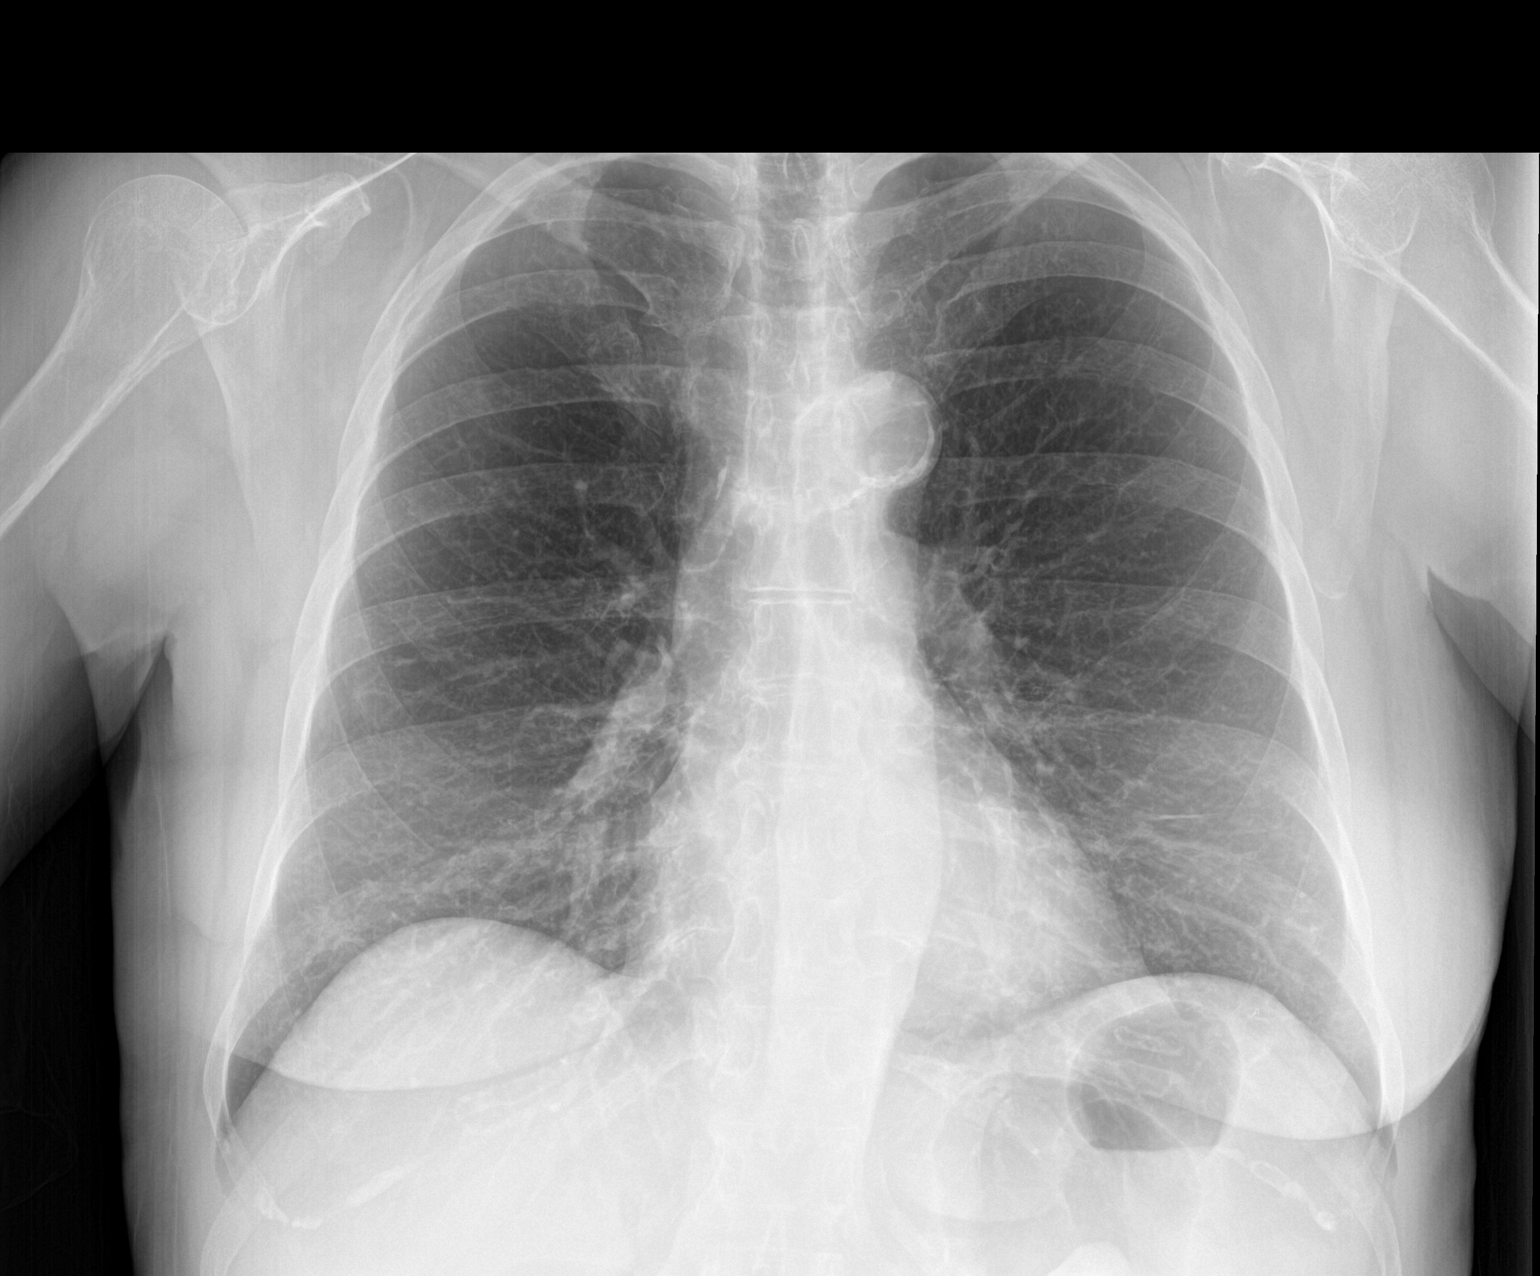

[chest lat]
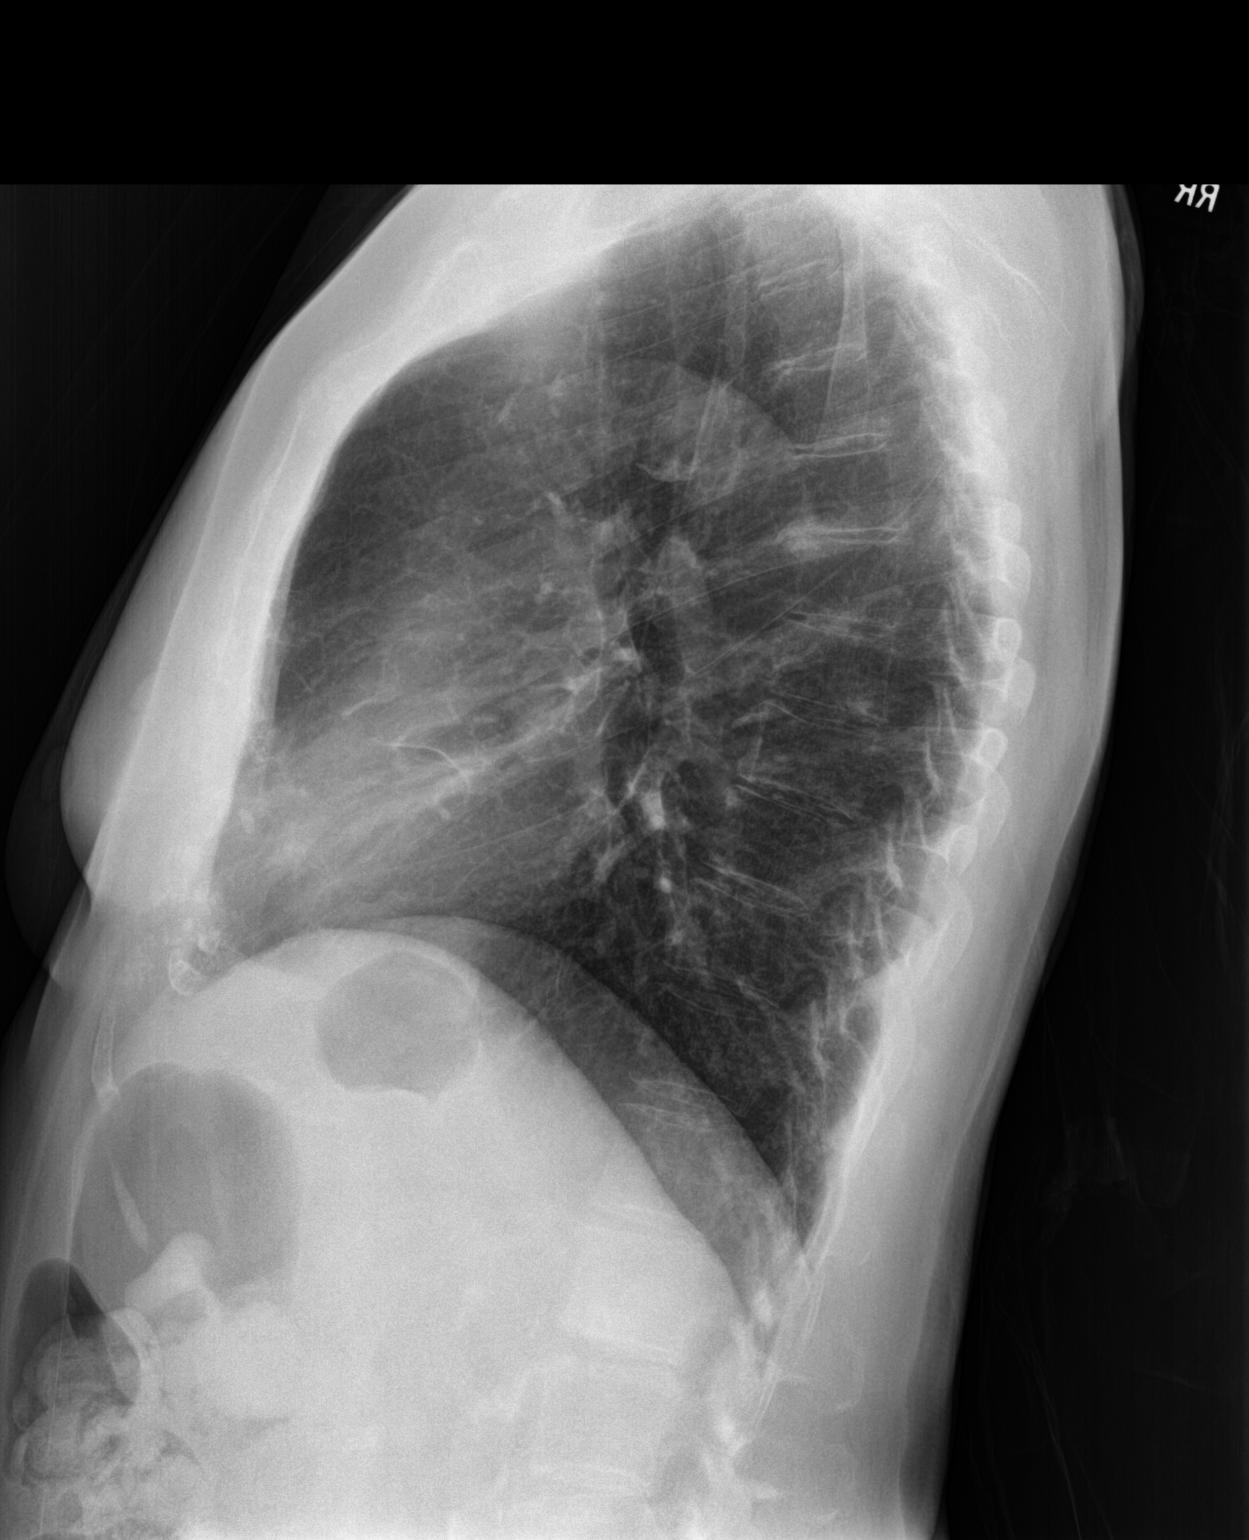

[2 of 2 positions shown; findings below may reference images not displayed]

FINDINGS: Lungs are adequately inflated without focal consolidation or
effusion. Cardiomediastinal silhouette is within normal. There is
calcified plaque over the thoracic aorta. There are mild degenerate
changes of the spine.
IMPRESSION: No acute cardiopulmonary disease.

Aortic atherosclerosis.

## 2017-03-30 DIAGNOSIS — M545 Low back pain: Secondary | ICD-10-CM | POA: Diagnosis not present

## 2017-03-30 DIAGNOSIS — G8929 Other chronic pain: Secondary | ICD-10-CM | POA: Diagnosis not present

## 2017-04-04 DIAGNOSIS — G8929 Other chronic pain: Secondary | ICD-10-CM | POA: Diagnosis not present

## 2017-04-04 DIAGNOSIS — M545 Low back pain: Secondary | ICD-10-CM | POA: Diagnosis not present

## 2017-04-04 DIAGNOSIS — M4686 Other specified inflammatory spondylopathies, lumbar region: Secondary | ICD-10-CM | POA: Diagnosis not present

## 2017-04-12 DIAGNOSIS — H2513 Age-related nuclear cataract, bilateral: Secondary | ICD-10-CM | POA: Diagnosis not present

## 2017-04-12 DIAGNOSIS — H524 Presbyopia: Secondary | ICD-10-CM | POA: Diagnosis not present

## 2017-04-12 DIAGNOSIS — H5203 Hypermetropia, bilateral: Secondary | ICD-10-CM | POA: Diagnosis not present

## 2017-04-12 DIAGNOSIS — H47323 Drusen of optic disc, bilateral: Secondary | ICD-10-CM | POA: Diagnosis not present

## 2017-04-12 DIAGNOSIS — H52223 Regular astigmatism, bilateral: Secondary | ICD-10-CM | POA: Diagnosis not present

## 2017-05-02 DIAGNOSIS — G8929 Other chronic pain: Secondary | ICD-10-CM | POA: Diagnosis not present

## 2017-05-02 DIAGNOSIS — M25562 Pain in left knee: Secondary | ICD-10-CM | POA: Diagnosis not present

## 2017-05-02 DIAGNOSIS — M25561 Pain in right knee: Secondary | ICD-10-CM | POA: Diagnosis not present

## 2017-05-02 DIAGNOSIS — M2241 Chondromalacia patellae, right knee: Secondary | ICD-10-CM | POA: Diagnosis not present

## 2017-05-02 DIAGNOSIS — M2242 Chondromalacia patellae, left knee: Secondary | ICD-10-CM | POA: Diagnosis not present

## 2017-05-10 DIAGNOSIS — M2242 Chondromalacia patellae, left knee: Secondary | ICD-10-CM | POA: Diagnosis not present

## 2017-05-10 DIAGNOSIS — M2241 Chondromalacia patellae, right knee: Secondary | ICD-10-CM | POA: Diagnosis not present

## 2017-05-15 ENCOUNTER — Ambulatory Visit (INDEPENDENT_AMBULATORY_CARE_PROVIDER_SITE_OTHER): Payer: Medicare Other | Admitting: Physician Assistant

## 2017-05-15 ENCOUNTER — Ambulatory Visit: Payer: Medicare Other

## 2017-05-15 ENCOUNTER — Encounter: Payer: Self-pay | Admitting: Physician Assistant

## 2017-05-15 VITALS — BP 147/88 | HR 81 | Temp 98.4°F | Resp 18 | Ht 64.5 in | Wt 156.8 lb

## 2017-05-15 DIAGNOSIS — R059 Cough, unspecified: Secondary | ICD-10-CM

## 2017-05-15 DIAGNOSIS — Z853 Personal history of malignant neoplasm of breast: Secondary | ICD-10-CM | POA: Diagnosis not present

## 2017-05-15 DIAGNOSIS — R05 Cough: Secondary | ICD-10-CM

## 2017-05-15 DIAGNOSIS — R000 Tachycardia, unspecified: Secondary | ICD-10-CM

## 2017-05-15 DIAGNOSIS — Z1231 Encounter for screening mammogram for malignant neoplasm of breast: Secondary | ICD-10-CM | POA: Diagnosis not present

## 2017-05-15 DIAGNOSIS — M8589 Other specified disorders of bone density and structure, multiple sites: Secondary | ICD-10-CM | POA: Diagnosis not present

## 2017-05-15 DIAGNOSIS — M810 Age-related osteoporosis without current pathological fracture: Secondary | ICD-10-CM | POA: Diagnosis not present

## 2017-05-15 LAB — POCT CBC
Granulocyte percent: 49.9 %G (ref 37–80)
HEMATOCRIT: 36.2 % — AB (ref 37.7–47.9)
Hemoglobin: 12.5 g/dL (ref 12.2–16.2)
Lymph, poc: 1.9 (ref 0.6–3.4)
MCH, POC: 30.5 pg (ref 27–31.2)
MCHC: 34.5 g/dL (ref 31.8–35.4)
MCV: 88.6 fL (ref 80–97)
MID (CBC): 0.4 (ref 0–0.9)
MPV: 5.9 fL (ref 0–99.8)
POC GRANULOCYTE: 2.3 (ref 2–6.9)
POC LYMPH %: 41.6 % (ref 10–50)
POC MID %: 8.5 % (ref 0–12)
Platelet Count, POC: 234 10*3/uL (ref 142–424)
RBC: 4.08 M/uL (ref 4.04–5.48)
RDW, POC: 13.4 %
WBC: 4.6 10*3/uL (ref 4.6–10.2)

## 2017-05-15 MED ORDER — BENZONATATE 100 MG PO CAPS: 100.0000 mg | ORAL_CAPSULE | Freq: Three times a day (TID) | ORAL | 0 refills | Status: DC | PRN

## 2017-05-15 NOTE — Patient Instructions (Addendum)
-   Your labs are reassuring today. We will treat this as a respiratory viral infection.  - I recommend you rest, drink plenty of fluids, eat light meals including soups.  - You may use OTC cough syrup at night for your cough and sore throat, Tessalon pearls during the day.  - You may also use Tylenol or ibuprofen over-the-counter for your sore throat.  - Please let me know if you are not seeing any improvement or get worse in 5-7 days.   Cough, Adult A cough helps to clear your throat and lungs. A cough may last only 2-3 weeks (acute), or it may last longer than 8 weeks (chronic). Many different things can cause a cough. A cough may be a sign of an illness or another medical condition. Follow these instructions at home:  Pay attention to any changes in your cough.  Take medicines only as told by your doctor.  If you were prescribed an antibiotic medicine, take it as told by your doctor. Do not stop taking it even if you start to feel better.  Talk with your doctor before you try using a cough medicine.  Drink enough fluid to keep your pee (urine) clear or pale yellow.  If the air is dry, use a cold steam vaporizer or humidifier in your home.  Stay away from things that make you cough at work or at home.  If your cough is worse at night, try using extra pillows to raise your head up higher while you sleep.  Do not smoke, and try not to be around smoke. If you need help quitting, ask your doctor.  Do not have caffeine.  Do not drink alcohol.  Rest as needed. Contact a doctor if:  You have new problems (symptoms).  You cough up yellow fluid (pus).  Your cough does not get better after 2-3 weeks, or your cough gets worse.  Medicine does not help your cough and you are not sleeping well.  You have pain that gets worse or pain that is not helped with medicine.  You have a fever.  You are losing weight and you do not know why.  You have night sweats. Get help right away  if:  You cough up blood.  You have trouble breathing.  Your heartbeat is very fast. This information is not intended to replace advice given to you by your health care provider. Make sure you discuss any questions you have with your health care provider. Document Released: 08/23/2011 Document Revised: 05/17/2016 Document Reviewed: 02/16/2015 Elsevier Interactive Patient Education  2017 [employer].   IF you received an x-ray today, you will receive an invoice from [facility]. Please contact [facility] at [phone] with questions or concerns regarding your invoice.   IF you received labwork today, you will receive an invoice from [place]. Please contact [facility] at [phone] with questions or concerns regarding your invoice.   Our billing staff will not be able to assist you with questions regarding bills from these companies.  You will be contacted with the lab results as soon as they are available. The fastest way to get your results is to activate your My Chart account. Instructions are located on the last page of this paperwork. If you have not heard from [place] regarding the results in 2 weeks, please contact this office.

## 2017-05-15 NOTE — Progress Notes (Signed)
MRN: [id number] DOB: 04-Nov-1946  Subjective:   [name] is a 71 y.o. female presenting for chief complaint of Cough (x5 days; producing yellow sputum); Shortness of Breath; and Headache .  Reports 5 day history of sinus headache, sore throat, productive cough (no hemoptysis), shortness of breath, subjective fever, and chest tightness Has tried cough drops, zyrtec, albuterol inhaler, and cipro with moderate relief. Symptoms have improved since they initially started but she just wanted to make sure she was okay. States she is no longer having SOB and chest tightness. The cough is no longer productive. Denies, ear pain, wheezing, myalgia, chills, nausea, vomiting, abdominal pain, diarrhea, exertional chest pain, lower leg swelling, and diaphoresis. Has not had sick contact with anyone. Has history of seasonal allergies, no history of asthma. Patient did not have flu shot this season. Former smoker, smoked 3-4 ppd x 30 years. Has not drank lots of water today.  Hx of pneumonia.  Denies any other aggravating or relieving factors, no other questions or concerns.  [name] has a current medication list which includes the following prescription(s): albuterol, bupropion, cetirizine, cyclobenzaprine, desonide, escitalopram, fluticasone, lansoprazole, lorazepam, ondansetron, simvastatin, trazodone, valacyclovir, and benzonatate. Also is allergic to augmentin [amoxicillin-pot clavulanate] and sulfa drugs cross reactors.  [name]  has a past medical history of Allergy; Anemia; Anxiety; Arthritis; Astigmatism; Bursitis; Bursitis of hip; Cancer ([place]) (10/2006); Carpal tunnel syndrome; Cataracts, bilateral; Colitis; Depression; Drusen of both optic discs; Endometriosis; Fibromyalgia; Gall stones; GERD (gastroesophageal reflux disease); Hyperlipidemia; Pneumonia; and Tendon tear. Also  has a past surgical history that includes Appendectomy; Breast surgery (Right, 12/11/2006); Carpal tunnel release; and Lumbar fusion.     Objective:   Vitals: BP (!) 147/88   Pulse 81   Temp 98.4 F (36.9 C) (Oral)   Resp 18   Ht 5' 4.5" (1.638 m)   Wt 156 lb 12.8 oz (71.1 kg)   SpO2 96%   BMI 26.50 kg/m   Physical Exam  Constitutional: She is oriented to person, place, and time. She appears well-developed and well-nourished.  HENT:  Head: Normocephalic and atraumatic.  Right Ear: Tympanic membrane, external ear and ear canal normal.  Left Ear: Tympanic membrane, external ear and ear canal normal.  Nose: Mucosal edema (mild) present. Right sinus exhibits no maxillary sinus tenderness and no frontal sinus tenderness. Left sinus exhibits no maxillary sinus tenderness and no frontal sinus tenderness.  Mouth/Throat: Uvula is midline, oropharynx is clear and moist and mucous membranes are normal. Abnormal dentition. No tonsillar exudate.  Eyes: Conjunctivae are normal.  Neck: Normal range of motion.  Cardiovascular: Normal rate, regular rhythm and normal heart sounds.   Pulmonary/Chest: Effort normal and breath sounds normal. No respiratory distress. She has no decreased breath sounds. She has no wheezes. She has no rhonchi.  Lymphadenopathy:       Head (right side): No submental, no submandibular, no tonsillar, no preauricular, no posterior auricular and no occipital adenopathy present.       Head (left side): No submental, no submandibular, no tonsillar, no preauricular, no posterior auricular and no occipital adenopathy present.    She has no cervical adenopathy.       Right: No supraclavicular adenopathy present.       Left: No supraclavicular adenopathy present.  Neurological: She is alert and oriented to person, place, and time.  Skin: Skin is warm and dry.  Psychiatric: Her mood appears anxious.  Vitals reviewed.    Results for orders placed or performed in visit on  05/15/17 (from the past 24 hour(s))  POCT CBC     Status: Abnormal   Collection Time: 05/15/17  4:07 PM  Result Value Ref Range   WBC 4.6 4.6  - 10.2 K/uL   Lymph, poc 1.9 0.6 - 3.4   POC LYMPH PERCENT 41.6 10 - 50 %L   MID (cbc) 0.4 0 - 0.9   POC MID % 8.5 0 - 12 %M   POC Granulocyte 2.3 2 - 6.9   Granulocyte percent 49.9 37 - 80 %G   RBC 4.08 4.04 - 5.48 M/uL   Hemoglobin 12.5 12.2 - 16.2 g/dL   HCT, POC 36.2 (A) 37.7 - 47.9 %   MCV 88.6 80 - 97 fL   MCH, POC 30.5 27 - 31.2 pg   MCHC 34.5 31.8 - 35.4 g/dL   RDW, POC 13.4 %   Platelet Count, POC 234 142 - 424 K/uL   MPV 5.9 0 - 99.8 fL   EKG shows sinus rhythm with rate of 81 bpm. PR and QRS intervals within normal limits. No acute changes noted from prior EKG of 08/22/10.   Assessment and Plan :  1. Cough Lung exam and POCT testing reassuring. Pt symptoms are resolving. She does not appear in any distress. Will treat for viral etiology. Instructed to return to clinic if symptoms worsen, do not improve in 5-7 days, or as needed - POCT CBC - benzonatate (TESSALON) 100 MG capsule; Take 1-2 capsules (100-200 mg total) by mouth 3 (three) times daily as needed for cough.  Dispense: 40 capsule; Refill: 0  2. Tachycardia Resolved with two 8 oz cups of water. EKG stable. Pt encouraged to continue oral hydration with at least 64 oz of water a day.  - EKG 12-Lead  Tenna Delaine, PA-C  [facility] [place] 05/15/2017 7:35 PM

## 2017-06-07 DIAGNOSIS — M2242 Chondromalacia patellae, left knee: Secondary | ICD-10-CM | POA: Diagnosis not present

## 2017-06-07 DIAGNOSIS — M2241 Chondromalacia patellae, right knee: Secondary | ICD-10-CM | POA: Diagnosis not present

## 2017-06-12 DIAGNOSIS — M5136 Other intervertebral disc degeneration, lumbar region: Secondary | ICD-10-CM | POA: Diagnosis not present

## 2017-06-12 DIAGNOSIS — M47816 Spondylosis without myelopathy or radiculopathy, lumbar region: Secondary | ICD-10-CM | POA: Diagnosis not present

## 2017-06-19 DIAGNOSIS — M810 Age-related osteoporosis without current pathological fracture: Secondary | ICD-10-CM | POA: Diagnosis not present

## 2017-06-19 DIAGNOSIS — E559 Vitamin D deficiency, unspecified: Secondary | ICD-10-CM | POA: Diagnosis not present

## 2017-06-21 DIAGNOSIS — M5136 Other intervertebral disc degeneration, lumbar region: Secondary | ICD-10-CM | POA: Diagnosis not present

## 2017-06-21 DIAGNOSIS — M47816 Spondylosis without myelopathy or radiculopathy, lumbar region: Secondary | ICD-10-CM | POA: Diagnosis not present

## 2017-06-24 DIAGNOSIS — M47816 Spondylosis without myelopathy or radiculopathy, lumbar region: Secondary | ICD-10-CM | POA: Diagnosis not present

## 2017-06-24 DIAGNOSIS — M5136 Other intervertebral disc degeneration, lumbar region: Secondary | ICD-10-CM | POA: Diagnosis not present

## 2017-06-28 DIAGNOSIS — M5136 Other intervertebral disc degeneration, lumbar region: Secondary | ICD-10-CM | POA: Diagnosis not present

## 2017-06-28 DIAGNOSIS — M47816 Spondylosis without myelopathy or radiculopathy, lumbar region: Secondary | ICD-10-CM | POA: Diagnosis not present

## 2017-06-28 DIAGNOSIS — R202 Paresthesia of skin: Secondary | ICD-10-CM | POA: Diagnosis not present

## 2017-06-28 DIAGNOSIS — R200 Anesthesia of skin: Secondary | ICD-10-CM | POA: Diagnosis not present

## 2017-06-28 DIAGNOSIS — M5442 Lumbago with sciatica, left side: Secondary | ICD-10-CM | POA: Diagnosis not present

## 2017-07-08 DIAGNOSIS — M47816 Spondylosis without myelopathy or radiculopathy, lumbar region: Secondary | ICD-10-CM | POA: Diagnosis not present

## 2017-07-08 DIAGNOSIS — M5136 Other intervertebral disc degeneration, lumbar region: Secondary | ICD-10-CM | POA: Diagnosis not present

## 2017-07-11 ENCOUNTER — Encounter: Payer: Self-pay | Admitting: Neurology

## 2017-07-11 ENCOUNTER — Ambulatory Visit (INDEPENDENT_AMBULATORY_CARE_PROVIDER_SITE_OTHER): Payer: Medicare Other | Admitting: Neurology

## 2017-07-11 VITALS — BP 146/95 | HR 90 | Ht 66.0 in | Wt 154.6 lb

## 2017-07-11 DIAGNOSIS — H547 Unspecified visual loss: Secondary | ICD-10-CM | POA: Diagnosis not present

## 2017-07-11 DIAGNOSIS — R200 Anesthesia of skin: Secondary | ICD-10-CM | POA: Diagnosis not present

## 2017-07-11 DIAGNOSIS — M542 Cervicalgia: Secondary | ICD-10-CM

## 2017-07-11 DIAGNOSIS — R531 Weakness: Secondary | ICD-10-CM | POA: Diagnosis not present

## 2017-07-11 DIAGNOSIS — R519 Headache, unspecified: Secondary | ICD-10-CM

## 2017-07-11 DIAGNOSIS — R51 Headache: Secondary | ICD-10-CM

## 2017-07-11 DIAGNOSIS — R202 Paresthesia of skin: Secondary | ICD-10-CM

## 2017-07-11 NOTE — Patient Instructions (Signed)
Remember to drink plenty of fluid, eat healthy meals and do not skip any meals. Try to eat protein with a every meal and eat a healthy snack such as fruit or nuts in between meals. Try to keep a regular sleep-wake schedule and try to exercise daily, particularly in the form of walking, 20-30 minutes a day, if you can.   As far as diagnostic testing: MRI brain w/wo contrast  I would like to see you back if needed, sooner if we need to. Please call us with any interim questions, concerns, problems, updates or refill requests.   Our phone number is [phone]. We also have an after hours call service for urgent matters and there is a physician on-call for urgent questions. For any emergencies you know to call 911 or go to the nearest emergency room

## 2017-07-11 NOTE — Progress Notes (Addendum)
[facility]    Provider:  Dr Jaynee Eagles Referring Provider: Suella Broad, MD Primary Care Physician:  Carol Ada, MD  CC:  Numbness  HPI:  [name] is a 71 y.o. female here as a referral from Dr. Nelva Bush for numbness. PMHx Fibromyalgia, HLD, GERD, optic nerve drusen, depression, CTS, breast cancer 2007, arthritis, anxiety, chronic joint pain, chronic bilateral low back pain without sciatica, facet arthropathy. She is in 10/10 pain right now (she appears comfortable). She had a facet block on 06/21/2017 and 8 hours later she had severe pain radiating "straight down the middle of her body from her head to her feet" on the left side. The pain has been "moving around" it is in her head and neck and arm and all started at the same time. Since the shot her left leg is weak as well. The left side of the face is definitely involved. She has headache as well, but no fevers, no chills, she has vision changes that happened at the same time and now she can;t read a magazine.   Reviewed notes, labs and imaging from outside physicians, which showed:  Reviewed notes from [facility] orthopedics. Patient was seen for follow-up of their back and back pain, low back and some widespread joint pain. Patient reported pain, stiffness and leg pain especially in the left buttocks. Patient started experiencing numbness and tingling from top of the left side of the head down the whole left side of the body to the foot he had an injection. Slowly spread to the right side. He is taking Ultram and Norco. Current pain level was 8 or 9 out of 10. Also prednisone area has had cortisone injections 06/21/2017 months. Follow-up appointment she was complaining of numbness and tingling in her right leg as opposed to the side of body including face arm and leg.  Vitamin B12 in 2014 was 342. MRI brain 11/2007: Personally reviewed images and agree with the following  IMPRESSION:  1. No acute or reversible brain  pathology.  Minor chronic small vessel type changes in the white matter.  These appear the same and are not likely of clinical relevance.   2. Fluid in the mastoid air cells, significance uncertain.  Fluid could be sterile or infected.   Review of Systems: Patient complains of symptoms per HPI as well as the following symptoms: Fatigue, eye pain, shortness of breath, cough, feeling hot, joint pain, joint swelling, aching muscles, constipation, urinary problems, allergies, memory loss, confusion, headache, numbness, weakness, tremor, depression, anxiety, decreased energy. Pertinent negatives and positives per HPI. All others negative.   Social History   Social History  . Marital status: Divorced    Spouse name: N/A  . Number of children: N/A  . Years of education: N/A   Occupational History  . Not on file.   Social History Main Topics  . Smoking status: Former Research scientist (life sciences)  . Smokeless tobacco: Never Used     Comment: 3-4 packs per day x 30 years  . Alcohol use No     Comment: AA since 1989  . Drug use: No     Comment: stopped 1989  . Sexual activity: No   Other Topics Concern  . Not on file   Social History Narrative   Lives at home    Family History  Problem Relation Age of Onset  . Cancer Mother   . Heart disease Mother   . Cancer Father        Colon cancer  . Heart  disease Father   . Transient ischemic attack Father   . Heart disease Brother   . Heart disease Brother   . Vision loss Sister     Past Medical History:  Diagnosis Date  . Allergy   . Anemia   . Anxiety   . Arthritis   . Astigmatism   . Bursitis   . Bursitis of hip   . Cancer ([place]) 10/2006   Invasive ductal carcinoma of the right breast   . Carpal tunnel syndrome   . Cataracts, bilateral   . Colitis   . Depression   . 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 Endometriosis   . Fibromyalgia   . Gall stones   . GERD (gastroesophageal reflux disease)   . Hyperlipidemia   . Pneumonia   . Tendon tear      Past Surgical History:  Procedure Laterality Date  . APPENDECTOMY    . BREAST SURGERY Right 12/11/2006   Lumpectomy  . CARPAL TUNNEL RELEASE    . CYST EXCISION    . ELBOW SURGERY    . LUMBAR FUSION      Current Outpatient Prescriptions  Medication Sig Dispense Refill  . albuterol (PROAIR HFA) 108 (90 Base) MCG/ACT inhaler Inhale 2 puffs into the lungs every 4 (four) hours as needed for wheezing or shortness of breath. 1 Inhaler 1  . benzonatate (TESSALON) 100 MG capsule Take 1-2 capsules (100-200 mg total) by mouth 3 (three) times daily as needed for cough. 40 capsule 0  . buPROPion (WELLBUTRIN XL) 300 MG 24 hr tablet Take 300 mg by mouth daily.      . cetirizine (ZYRTEC) 10 MG tablet Take 10 mg by mouth daily.      . cyclobenzaprine (FLEXERIL) 10 MG tablet Take 10 mg by mouth at bedtime.     Marland Kitchen escitalopram (LEXAPRO) 20 MG tablet Take 20 mg by mouth daily.     . fluticasone (FLONASE) 50 MCG/ACT nasal spray Place 2 sprays into both nostrils daily.     . lansoprazole (PREVACID) 15 MG capsule Take 15 mg by mouth daily.     Marland Kitchen LORazepam (ATIVAN) 0.5 MG tablet Take 1 mg by mouth at bedtime.     . ondansetron (ZOFRAN) 8 MG tablet Take 8 mg by mouth every 8 (eight) hours as needed.     . simvastatin (ZOCOR) 20 MG tablet Take 20 mg by mouth at bedtime.      . traZODone (DESYREL) 50 MG tablet Take 50 mg by mouth at bedtime.      No current facility-administered medications for this visit.     Allergies as of 07/11/2017 - Review Complete 07/11/2017  Allergen Reaction Noted  . Augmentin [amoxicillin-pot clavulanate]  12/17/2015  . Sulfa drugs cross reactors  04/27/2012    Vitals: BP (!) 146/95   Pulse 90   Ht 5\' 6"  (1.676 m)   Wt 154 lb 9.6 oz (70.1 kg)   BMI 24.95 kg/m  Last Weight:  Wt Readings from Last 1 Encounters:  07/11/17 154 lb 9.6 oz (70.1 kg)   Last Height:   Ht Readings from Last 1 Encounters:  07/11/17 5\' 6"  (1.676 m)   Physical exam: Exam: Gen: NAD,  conversant                 CV: RRR, no MRG. No Carotid Bruits. No peripheral edema, warm, nontender Eyes: Conjunctivae clear without exudates or hemorrhage  Neuro: Detailed Neurologic Exam  Speech:    Speech  is normal; fluent and spontaneous with normal comprehension.  Cognition:    The patient is oriented to person, place, and time;     recent and remote memory intact;     language fluent;     normal attention, concentration,     fund of knowledge Cranial Nerves:    The pupils are equal, round, and reactive to light. Attempted funduscopic exam could not visualize. Visual fields are full to finger confrontation. Mildly impaired upgaze otherwise extraocular movements are intact. Trigeminal sensation is impaired on the left splits midline but the muscles of mastication are normal. The face is symmetric. The palate elevates in the midline. Hearing intact. Voice is normal. Shoulder shrug is normal. The tongue has normal motion without fasciculations.   Coordination:    No dysmetria  Gait:    Can walk on toes and heels with imbalance, difficulty with tandem  Motor Observation:    No asymmetry, no atrophy, and no involuntary movements noted. Tone:    Normal muscle tone.    Posture:    Posture is normal. normal erect    Strength: lower extremity weakness with giveway and poor effort. Otherwise strength is V/V in the upper and lower limbs.      Sensation: left hemibody decreased sensation (splits midline)     Reflex Exam:  DTR's:    Deep tendon reflexes in the upper extrenities are brisk of age and lower extremities are absent bilaterally.   Toes:    The toes are downgoing bilaterally.   Clonus:    Clonus is absent.       Assessment/Plan:  71 year old who appears comfortable who is in "10/10 pain" and reports acute left-sided motor and sensory changes 06/21/2017 8 hours after a facet block. There is no reason a facet block that I can think of would cause this type of clinical  picture. I did let patient know this. I do recommend an MRI of the brain however to rule out strokes or other lesions of the brain that could cause hemisensory loss and patient's reported left-sided weakness (on exam her uppers were normal and her lowers diffuse weakness with giveaway and poor effort possibly secondary to pain). Given her "10 out of 10" pain today while appearing comfortable I do think that there is exaggeration or poor pain perception. Patient insists her left-sided hemibody pain is due to the facet block  Patient asked if I prescribe pain medication and Explained to patient that I do not prescribe pain medication MRI of the brain w/wo contrast due to new onset headache after the age of 72, acute hemisensory loss and perceived left-sided weakness MRI lumbar spine  Addendum: MRI brain and lumbar spine unremarkable, no cause for symptoms needs MRI cervical spine and emg/ncs left arm and leg  Sarina Ill, MD  [facility] [address] [place], [place] [address]  Phone [phone] Fax [phone]

## 2017-07-12 DIAGNOSIS — M47816 Spondylosis without myelopathy or radiculopathy, lumbar region: Secondary | ICD-10-CM | POA: Diagnosis not present

## 2017-07-12 DIAGNOSIS — M5136 Other intervertebral disc degeneration, lumbar region: Secondary | ICD-10-CM | POA: Diagnosis not present

## 2017-07-12 LAB — BASIC METABOLIC PANEL
BUN / CREAT RATIO: 19 (ref 12–28)
BUN: 16 mg/dL (ref 8–27)
CO2: 25 mmol/L (ref 20–29)
CREATININE: 0.85 mg/dL (ref 0.57–1.00)
Calcium: 9.5 mg/dL (ref 8.7–10.3)
Chloride: 101 mmol/L (ref 96–106)
GFR calc Af Amer: 80 mL/min/{1.73_m2} (ref >59)
GFR, EST NON AFRICAN AMERICAN: 69 mL/min/{1.73_m2} (ref >59)
GLUCOSE: 95 mg/dL (ref 65–99)
POTASSIUM: 5 mmol/L (ref 3.5–5.2)
SODIUM: 138 mmol/L (ref 134–144)

## 2017-07-16 ENCOUNTER — Telehealth: Payer: Self-pay

## 2017-07-16 NOTE — Telephone Encounter (Signed)
-----   Message from Melvenia Beam, MD sent at 07/14/2017  9:15 PM EDT ----- Labs normal thanks

## 2017-07-16 NOTE — Telephone Encounter (Signed)
Called pt w/ unremarkable lab results. May call back w/ additional questions/concerns. 

## 2017-07-21 ENCOUNTER — Ambulatory Visit
Admission: RE | Admit: 2017-07-21 | Discharge: 2017-07-21 | Disposition: A | Discharge status: Home / Self Care | Payer: Medicare Other | Source: Ambulatory Visit | Attending: Neurology | Admitting: Neurology

## 2017-07-21 DIAGNOSIS — R200 Anesthesia of skin: Secondary | ICD-10-CM

## 2017-07-21 DIAGNOSIS — R202 Paresthesia of skin: Secondary | ICD-10-CM

## 2017-07-21 DIAGNOSIS — R51 Headache: Secondary | ICD-10-CM

## 2017-07-21 DIAGNOSIS — H547 Unspecified visual loss: Secondary | ICD-10-CM

## 2017-07-21 DIAGNOSIS — R531 Weakness: Secondary | ICD-10-CM

## 2017-07-21 DIAGNOSIS — R519 Headache, unspecified: Secondary | ICD-10-CM

## 2017-07-21 MED ORDER — GADOBENATE DIMEGLUMINE 529 MG/ML IV SOLN
14.0000 mL | Freq: Once | INTRAVENOUS | Status: AC | PRN
  Administered 2017-07-21: 14 mL via INTRAVENOUS

## 2017-07-23 ENCOUNTER — Telehealth: Payer: Self-pay | Admitting: Neurology

## 2017-07-23 NOTE — Telephone Encounter (Signed)
Pt request MRI results. She is aware it can take 4 days for imaging to be read.

## 2017-07-24 ENCOUNTER — Telehealth: Payer: Self-pay

## 2017-07-24 DIAGNOSIS — R202 Paresthesia of skin: Secondary | ICD-10-CM

## 2017-07-24 DIAGNOSIS — R200 Anesthesia of skin: Secondary | ICD-10-CM

## 2017-07-24 DIAGNOSIS — R531 Weakness: Secondary | ICD-10-CM

## 2017-07-24 NOTE — Telephone Encounter (Signed)
Called pt w/ MRI results. Questions answered. She verbalized understanding. Stated that she would schedule appt w/ ENT d/t ear effusion and ongoing balance issues. Also mentioned that she has had complete numbness/tingling on one side since having a lumbar facet joint injection.  Reassured her that MRI showed no signs of stroke. Voiced appreciation for call.

## 2017-07-24 NOTE — Telephone Encounter (Signed)
Orders entered

## 2017-07-24 NOTE — Telephone Encounter (Signed)
We could perform an MRI of the lumbar spine, if she is willing we can order it thanks

## 2017-07-24 NOTE — Addendum Note (Signed)
Addended by: Monte Fantasia on: 07/24/2017 04:47 PM   Modules accepted: Orders

## 2017-07-24 NOTE — Telephone Encounter (Signed)
-----   Message from Melvenia Beam, MD sent at 07/23/2017  2:11 PM EDT ----- Let her know brain is normal for age. Incidental finding: Minimal right mastoid effusion, most consistent with mild eustachian tube dysfunction. Happy to review it with her in the office if she likes.

## 2017-07-24 NOTE — Telephone Encounter (Signed)
Called and spoke with patient. [name] is agreeable to have MRI lumbar spine. Advised I will let Dr Jaynee Eagles know to place order for her. Once insurance approves it, they will call to schedule her for an appt. [name] verbalized understanding.

## 2017-07-26 ENCOUNTER — Ambulatory Visit
Admission: RE | Admit: 2017-07-26 | Discharge: 2017-07-26 | Disposition: A | Discharge status: Home / Self Care | Payer: Medicare Other | Source: Ambulatory Visit | Attending: Neurology | Admitting: Neurology

## 2017-07-26 DIAGNOSIS — R531 Weakness: Secondary | ICD-10-CM | POA: Diagnosis not present

## 2017-07-26 DIAGNOSIS — R202 Paresthesia of skin: Secondary | ICD-10-CM

## 2017-07-26 DIAGNOSIS — R200 Anesthesia of skin: Secondary | ICD-10-CM

## 2017-07-26 DIAGNOSIS — M5126 Other intervertebral disc displacement, lumbar region: Secondary | ICD-10-CM | POA: Diagnosis not present

## 2017-07-31 ENCOUNTER — Telehealth: Payer: Self-pay | Admitting: Neurology

## 2017-07-31 NOTE — Telephone Encounter (Signed)
Pt request MRI results.  °

## 2017-08-01 ENCOUNTER — Telehealth: Payer: Self-pay | Admitting: *Deleted

## 2017-08-01 NOTE — Telephone Encounter (Signed)
Spoke with patient and informed her that her  MRI lumbar spine was really unremarkable or essentially normal. Advised her she has some minimal arthritic changes which are expected as we age, but no pinching of neves or anything that looks concerning or a cause of her symptoms.  Patient stated she read the MRI on my chart and still wants an explanation of her pain. This RN advised her that the MRI did not show any reason for her pain. Patient was very insistent she talk to Dr Jaynee Eagles for further explanation. This RN was unable to satisfy her questions. Dr Jaynee Eagles spoke with the patient and offered her a EMG/NCS and MRI of cervical spine. The patient agreed to both of those. Dr Jaynee Eagles will order, and patient understands she will get a call to schedule the tests.

## 2017-08-01 NOTE — Addendum Note (Signed)
Addended by: Sarina Ill B on: 08/01/2017 07:33 PM   Modules accepted: Orders

## 2017-08-01 NOTE — Telephone Encounter (Signed)
I spoke with patient, will proceed with MRI cervical spine and emg/ncs of left arm and leg.

## 2017-08-01 NOTE — Telephone Encounter (Signed)
Please see telephone note

## 2017-08-05 ENCOUNTER — Telehealth: Payer: Self-pay | Admitting: Neurology

## 2017-08-05 NOTE — Telephone Encounter (Signed)
New patient of Dr. Jaynee Eagles that was seen 07/11/17 for c/o numbness. Hx of fibro, hld, gerd, optic nerve drusen, depression, CTS, breast ca, arthritis, anxiety, chronic joint and back pain. Underwent facet joint injection on 06/21/17 and afterward c/o L sided pain, numbness, weakness and vision difficulties. Labs were unremarkable. MRI brain (07/21/17) was normal for age except for min R mastoid effusion for which she was advised to contact ENT. MRI lumbar (07/26/17) was essentially normal except for min arthritic changes, no pinched nerves or other cause noted. Due to her continued symptoms, MRI of cervical spine scheduled for this Friday and EMG/NCV scheduled 08/29/17.

## 2017-08-05 NOTE — Telephone Encounter (Signed)
Patient states has numbness in her face off and on and last Friday started  having problems with line between nose and upper lip on the left side leaning.

## 2017-08-05 NOTE — Telephone Encounter (Signed)
I recommend waiting out additional w/u, which is pending. If there are any sudden onset of weakness or slurring of speech or droopy face or one-sided numbness, she will have to proceed to the emergency room.

## 2017-08-06 NOTE — Telephone Encounter (Signed)
Returned pt TC, reviewed s/s of stroke and advised that she go to ER if these occur. Otherwise, plan to proceed w/ MRI and EMG/NCV as scheduled. May call back w/ any questions or additional concerns.

## 2017-08-07 DIAGNOSIS — R51 Headache: Secondary | ICD-10-CM | POA: Diagnosis not present

## 2017-08-09 ENCOUNTER — Ambulatory Visit
Admission: RE | Admit: 2017-08-09 | Discharge: 2017-08-09 | Disposition: A | Discharge status: Home / Self Care | Payer: Medicare Other | Source: Ambulatory Visit | Attending: Neurology | Admitting: Neurology

## 2017-08-09 DIAGNOSIS — R531 Weakness: Secondary | ICD-10-CM | POA: Diagnosis not present

## 2017-08-09 DIAGNOSIS — M50222 Other cervical disc displacement at C5-C6 level: Secondary | ICD-10-CM | POA: Diagnosis not present

## 2017-08-09 DIAGNOSIS — M50221 Other cervical disc displacement at C4-C5 level: Secondary | ICD-10-CM | POA: Diagnosis not present

## 2017-08-09 DIAGNOSIS — M542 Cervicalgia: Secondary | ICD-10-CM | POA: Diagnosis not present

## 2017-08-09 DIAGNOSIS — R200 Anesthesia of skin: Secondary | ICD-10-CM

## 2017-08-09 DIAGNOSIS — R202 Paresthesia of skin: Secondary | ICD-10-CM | POA: Diagnosis not present

## 2017-08-09 DIAGNOSIS — R51 Headache: Secondary | ICD-10-CM

## 2017-08-09 DIAGNOSIS — R519 Headache, unspecified: Secondary | ICD-10-CM

## 2017-08-09 DIAGNOSIS — M5021 Other cervical disc displacement,  high cervical region: Secondary | ICD-10-CM | POA: Diagnosis not present

## 2017-08-09 DIAGNOSIS — H547 Unspecified visual loss: Secondary | ICD-10-CM

## 2017-08-09 DIAGNOSIS — M50223 Other cervical disc displacement at C6-C7 level: Secondary | ICD-10-CM | POA: Diagnosis not present

## 2017-08-14 ENCOUNTER — Telehealth: Payer: Self-pay

## 2017-08-14 NOTE — Telephone Encounter (Signed)
RN call patient that her MR cervical spine without contrast showed mild to moderate degenerative changes throughout the neck. Nothing serious, no cord compression, no herniated disc. If she has significant neck pain she may benefit from seeing a spine specialist. She is encouraged to talk to her primary care physician about this.PT verbalized understanding. Pt ask about CD of the test. Rn advised pt to call [facility] imaging. Rn stated if she needs report to sign a release form and we can give her a report.

## 2017-08-14 NOTE — Progress Notes (Signed)
Katrina:  Please advise patient that her recent cervical spine MRI without contrast showed mild to moderate degenerative changes throughout the neck. Nothing serious, no cord compression, no herniated disc. If she has significant neck pain she may benefit from seeing a spine specialist. She is encouraged to talk to her primary care physician about this. Star Age, MD, PhD [facility])

## 2017-08-14 NOTE — Telephone Encounter (Signed)
-----   Message from Star Age, MD sent at 08/14/2017  4:55 PM EDT ----- Katrina:  Please advise patient that her recent cervical spine MRI without contrast showed mild to moderate degenerative changes throughout the neck. Nothing serious, no cord compression, no herniated disc. If she has significant neck pain she may benefit from seeing a spine specialist. She is encouraged to talk to her primary care physician about this. Star Age, MD, PhD [facility])

## 2017-08-16 DIAGNOSIS — H5710 Ocular pain, unspecified eye: Secondary | ICD-10-CM | POA: Diagnosis not present

## 2017-08-20 DIAGNOSIS — L988 Other specified disorders of the skin and subcutaneous tissue: Secondary | ICD-10-CM | POA: Diagnosis not present

## 2017-08-20 DIAGNOSIS — S80812A Abrasion, left lower leg, initial encounter: Secondary | ICD-10-CM | POA: Diagnosis not present

## 2017-08-21 DIAGNOSIS — M4696 Unspecified inflammatory spondylopathy, lumbar region: Secondary | ICD-10-CM | POA: Diagnosis not present

## 2017-08-21 DIAGNOSIS — R200 Anesthesia of skin: Secondary | ICD-10-CM | POA: Diagnosis not present

## 2017-08-27 DIAGNOSIS — M545 Low back pain: Secondary | ICD-10-CM | POA: Diagnosis not present

## 2017-08-27 DIAGNOSIS — M542 Cervicalgia: Secondary | ICD-10-CM | POA: Diagnosis not present

## 2017-08-27 DIAGNOSIS — R030 Elevated blood-pressure reading, without diagnosis of hypertension: Secondary | ICD-10-CM | POA: Diagnosis not present

## 2017-08-27 DIAGNOSIS — Z6825 Body mass index (BMI) 25.0-25.9, adult: Secondary | ICD-10-CM | POA: Diagnosis not present

## 2017-08-29 ENCOUNTER — Ambulatory Visit (INDEPENDENT_AMBULATORY_CARE_PROVIDER_SITE_OTHER): Payer: Self-pay | Admitting: Neurology

## 2017-08-29 DIAGNOSIS — H547 Unspecified visual loss: Secondary | ICD-10-CM

## 2017-08-29 DIAGNOSIS — R531 Weakness: Secondary | ICD-10-CM

## 2017-08-29 DIAGNOSIS — R200 Anesthesia of skin: Secondary | ICD-10-CM

## 2017-08-29 DIAGNOSIS — Z0289 Encounter for other administrative examinations: Secondary | ICD-10-CM

## 2017-08-29 DIAGNOSIS — M542 Cervicalgia: Secondary | ICD-10-CM

## 2017-08-29 DIAGNOSIS — R202 Paresthesia of skin: Secondary | ICD-10-CM

## 2017-08-29 DIAGNOSIS — R51 Headache: Secondary | ICD-10-CM

## 2017-08-29 DIAGNOSIS — R519 Headache, unspecified: Secondary | ICD-10-CM

## 2017-08-30 DIAGNOSIS — L280 Lichen simplex chronicus: Secondary | ICD-10-CM | POA: Diagnosis not present

## 2017-08-30 DIAGNOSIS — R238 Other skin changes: Secondary | ICD-10-CM | POA: Diagnosis not present

## 2017-08-30 DIAGNOSIS — F40298 Other specified phobia: Secondary | ICD-10-CM | POA: Diagnosis not present

## 2017-08-31 DIAGNOSIS — M5136 Other intervertebral disc degeneration, lumbar region: Secondary | ICD-10-CM | POA: Diagnosis not present

## 2017-08-31 DIAGNOSIS — M47816 Spondylosis without myelopathy or radiculopathy, lumbar region: Secondary | ICD-10-CM | POA: Diagnosis not present

## 2017-09-01 NOTE — Progress Notes (Signed)
Could not perform due to diffuse pustules and papules on the extremities

## 2017-09-02 ENCOUNTER — Ambulatory Visit (INDEPENDENT_AMBULATORY_CARE_PROVIDER_SITE_OTHER): Payer: Medicare Other | Admitting: Family Medicine

## 2017-09-02 ENCOUNTER — Encounter: Payer: Self-pay | Admitting: Family Medicine

## 2017-09-02 VITALS — BP 122/89 | HR 110 | Temp 98.7°F | Resp 20 | Ht 66.46 in | Wt 153.4 lb

## 2017-09-02 DIAGNOSIS — F22 Delusional disorders: Secondary | ICD-10-CM

## 2017-09-02 DIAGNOSIS — L821 Other seborrheic keratosis: Secondary | ICD-10-CM | POA: Diagnosis not present

## 2017-09-02 MED ORDER — RISPERIDONE 0.5 MG PO TABS: 0.5000 mg | ORAL_TABLET | Freq: Two times a day (BID) | ORAL | 0 refills | Status: DC

## 2017-09-02 NOTE — Progress Notes (Signed)
9/10/20183:19 PM  [name] 1946/03/11, 71 y.o. female [id number]  Chief Complaint  Patient presents with  . Rash    X 2 weeks - all over body  . Depression    screening    HPI:   Patient is a 71 y.o. female with past medical history significant for depression who presents today for concerns of rash. Patient states that she has been noticing these fleshy black spots on her extremities and she has started tweezing them out for hours to the point that she now has several open lesions on her legs. She saw her dermatologist who took a biopsy after patient insisting given that patient was insistent there was something wrong. Patient reports that she has a long standing history of depression and currently is under significant stress given her daughter will not let her see her grandchildren.  She has long history of sunbathing and still does so.  Depression screen [facility] 2/9 09/02/2017 05/15/2017 03/16/2017  Decreased Interest 0 2 2  Down, Depressed, Hopeless 2 2 1   PHQ - 2 Score 2 4 3   Altered sleeping 0 - 0  Tired, decreased energy 3 - 3  Change in appetite 2 - 0  Feeling bad or failure about yourself  2 - 2  Trouble concentrating 3 - 0  Moving slowly or fidgety/restless 0 - 0  Suicidal thoughts 0 - 0  PHQ-9 Score 12 - 8  Difficult doing work/chores Somewhat difficult - Somewhat difficult    Allergies  Allergen Reactions  . Augmentin [Amoxicillin-Pot Clavulanate]   . Sulfa Drugs Cross Reactors     Current Outpatient Prescriptions on File Prior to Visit  Medication Sig Dispense Refill  . albuterol (PROAIR HFA) 108 (90 Base) MCG/ACT inhaler Inhale 2 puffs into the lungs every 4 (four) hours as needed for wheezing or shortness of breath. 1 Inhaler 1  . buPROPion (WELLBUTRIN XL) 300 MG 24 hr tablet Take 300 mg by mouth daily.      . cetirizine (ZYRTEC) 10 MG tablet Take 10 mg by mouth daily.      . cyclobenzaprine (FLEXERIL) 10 MG tablet Take 10 mg by mouth at bedtime.     Marland Kitchen  escitalopram (LEXAPRO) 20 MG tablet Take 20 mg by mouth daily.     . fluticasone (FLONASE) 50 MCG/ACT nasal spray Place 2 sprays into both nostrils daily.     . lansoprazole (PREVACID) 15 MG capsule Take 15 mg by mouth daily.     Marland Kitchen LORazepam (ATIVAN) 0.5 MG tablet Take 1 mg by mouth at bedtime.     . ondansetron (ZOFRAN) 8 MG tablet Take 8 mg by mouth every 8 (eight) hours as needed.     . simvastatin (ZOCOR) 20 MG tablet Take 20 mg by mouth at bedtime.      . traZODone (DESYREL) 50 MG tablet Take 50 mg by mouth at bedtime.     . benzonatate (TESSALON) 100 MG capsule Take 1-2 capsules (100-200 mg total) by mouth 3 (three) times daily as needed for cough. (Patient not taking: Reported on 09/02/2017) 40 capsule 0   No current facility-administered medications on file prior to visit.     Past Medical History:  Diagnosis Date  . Allergy   . Anemia   . Anxiety   . Arthritis   . Astigmatism   . Bursitis   . Bursitis of hip   . Cancer ([place]) 10/2006   Invasive ductal carcinoma of the right breast   . Carpal tunnel  syndrome   . Cataracts, bilateral   . Colitis   . Depression   . 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 Endometriosis   . Fibromyalgia   . Gall stones   . GERD (gastroesophageal reflux disease)   . Hyperlipidemia   . Pneumonia   . Tendon tear     Past Surgical History:  Procedure Laterality Date  . APPENDECTOMY    . BREAST SURGERY Right 12/11/2006   Lumpectomy  . CARPAL TUNNEL RELEASE    . CYST EXCISION    . ELBOW SURGERY    . LUMBAR FUSION      Social History  Substance Use Topics  . Smoking status: Former Research scientist (life sciences)  . Smokeless tobacco: Never Used     Comment: 3-4 packs per day x 30 years  . Alcohol use No     Comment: AA since 1989    Family History  Problem Relation Age of Onset  . Cancer Mother   . Heart disease Mother   . Cancer Father        Colon cancer  . Heart disease Father   . Transient ischemic attack Father   . Heart disease Brother   . Heart  disease Brother   . Vision loss Sister     Review of Systems  Constitutional: Negative for chills, fever and malaise/fatigue.  Skin: Positive for itching and rash.  Psychiatric/Behavioral: Positive for depression. Negative for hallucinations, substance abuse and suicidal ideas. The patient is nervous/anxious.      OBJECTIVE:  Blood pressure 122/89, pulse (!) 110, temperature 98.7 F (37.1 C), temperature source Oral, resp. rate 20, height 5' 6.46" (1.688 m), weight 153 lb 6.4 oz (69.6 kg), SpO2 97 %.  Physical Exam  Constitutional: She is oriented to person, place, and time and well-developed, well-nourished, and in no distress. No distress.  Scratching constantly during our visit  HENT:  Head: Normocephalic and atraumatic.  Mouth/Throat: Oropharynx is clear and moist.  Eyes: Pupils are equal, round, and reactive to light. EOM are normal.  Neck: Neck supple.  Pulmonary/Chest: Effort normal.  Neurological: She is alert and oriented to person, place, and time.  Skin: Skin is warm and dry. No rash noted. She is not diaphoretic.  Patient with extensive evidence of sun damage, rash of concern are brown and black papules with stuck on appearance. She has several areas with excoriations and shallow ulcerations without evidence of infection.  Psychiatric: Her mood appears anxious.    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 Discussed new medication r/se/b. Discussed care of current wounds, derm already rx topical abx.   - risperiDONE (RISPERDAL) 0.5 MG tablet; Take 1 tablet (0.5 mg total) by mouth 2 (two) times daily.  Dispense: 60 tablet; Refill: 0  2. Seborrheic keratoses Spent more than 50% of this 25 minute visit discussing  natural history of sun damaged skin, importance of sun screen, patient  educational handout given  Fu 2 weeks       Rutherford Guys, MD [facility] at [facility] [place], [place] [address] Ph.  [phone] Fax [phone]

## 2017-09-02 NOTE — Patient Instructions (Addendum)
     IF you received an x-ray today, you will receive an invoice from [facility] Radiology. Please contact [facility] at [phone] with questions or concerns regarding your invoice.   IF you received labwork today, you will receive an invoice from [facility]. Please contact [facility] at [phone] with questions or concerns regarding your invoice.   Our billing staff will not be able to assist you with questions regarding bills from these companies.  You will be contacted with the lab results as soon as they are available. The fastest way to get your results is to activate your My Chart account. Instructions are located on the last page of this paperwork. If you have not heard from [place] regarding the results in 2 weeks, please contact this office.    Seborrheic Keratosis Seborrheic keratosis is a common, noncancerous (benign) skin growth. This condition causes waxy, rough, tan, brown, or black spots to appear on the skin. These skin growths can be flat or raised. What are the causes? The cause of this condition is not known. What increases the risk? This condition is more likely to develop in:  People who have a family history of seborrheic keratosis.  People who are 53 or older.  People who are pregnant.  People who have had estrogen replacement therapy.  What are the signs or symptoms? This condition often occurs on the face, chest, shoulders, back, or other areas. These growths:  Are usually painless, but may become irritated and itchy.  Can be yellow, brown, black, or other colors.  Are slightly raised or have a flat surface.  Are sometimes rough or wart-like in texture.  Are often waxy on the surface.  Are round or oval-shaped.  Sometimes look like they are "stuck on."  Often occur in groups, but may occur as a single growth.  How is this diagnosed? This condition is diagnosed with a medical history and physical exam. A sample of the growth may be tested  (skin biopsy). You may need to see a skin specialist (dermatologist). How is this treated? Treatment is not usually needed for this condition, unless the growths are irritated or are often bleeding. You may also choose to have the growths removed if you do not like their appearance. Most commonly, these growths are treated with a procedure in which liquid nitrogen is applied to "freeze" off the growth (cryosurgery). They may also be burned off with electricity or cut off. Follow these instructions at home:  Watch your growth for any changes.  Keep all follow-up visits as told by your health care provider. This is important.  Do not scratch or pick at the growth or growths. This can cause them to become irritated or infected. Contact a health care provider if:  You suddenly have many new growths.  Your growth bleeds, itches, or hurts.  Your growth suddenly becomes larger or changes color. This information is not intended to replace advice given to you by your health care provider. Make sure you discuss any questions you have with your health care provider. Document Released: 01/12/2011 Document Revised: 05/17/2016 Document Reviewed: 04/27/2015 Elsevier Interactive Patient Education  2017 [employer].

## 2017-09-12 DIAGNOSIS — R21 Rash and other nonspecific skin eruption: Secondary | ICD-10-CM | POA: Diagnosis not present

## 2017-09-12 DIAGNOSIS — L03116 Cellulitis of left lower limb: Secondary | ICD-10-CM | POA: Diagnosis not present

## 2017-09-13 DIAGNOSIS — H01112 Allergic dermatitis of right lower eyelid: Secondary | ICD-10-CM | POA: Diagnosis not present

## 2017-09-24 DIAGNOSIS — L281 Prurigo nodularis: Secondary | ICD-10-CM | POA: Diagnosis not present

## 2017-09-24 DIAGNOSIS — L738 Other specified follicular disorders: Secondary | ICD-10-CM | POA: Diagnosis not present

## 2017-09-25 DIAGNOSIS — H524 Presbyopia: Secondary | ICD-10-CM | POA: Diagnosis not present

## 2017-09-25 DIAGNOSIS — H52223 Regular astigmatism, bilateral: Secondary | ICD-10-CM | POA: Diagnosis not present

## 2017-09-25 DIAGNOSIS — H2513 Age-related nuclear cataract, bilateral: Secondary | ICD-10-CM | POA: Diagnosis not present

## 2017-09-25 DIAGNOSIS — H5203 Hypermetropia, bilateral: Secondary | ICD-10-CM | POA: Diagnosis not present

## 2017-09-27 DIAGNOSIS — M5136 Other intervertebral disc degeneration, lumbar region: Secondary | ICD-10-CM | POA: Diagnosis not present

## 2017-09-27 DIAGNOSIS — M545 Low back pain: Secondary | ICD-10-CM | POA: Diagnosis not present

## 2017-09-27 DIAGNOSIS — M47816 Spondylosis without myelopathy or radiculopathy, lumbar region: Secondary | ICD-10-CM | POA: Diagnosis not present

## 2017-09-27 DIAGNOSIS — G8929 Other chronic pain: Secondary | ICD-10-CM | POA: Diagnosis not present

## 2017-10-10 DIAGNOSIS — L859 Epidermal thickening, unspecified: Secondary | ICD-10-CM | POA: Diagnosis not present

## 2017-10-10 DIAGNOSIS — I96 Gangrene, not elsewhere classified: Secondary | ICD-10-CM | POA: Diagnosis not present

## 2017-10-10 DIAGNOSIS — L281 Prurigo nodularis: Secondary | ICD-10-CM | POA: Diagnosis not present

## 2017-10-22 DIAGNOSIS — F329 Major depressive disorder, single episode, unspecified: Secondary | ICD-10-CM | POA: Diagnosis not present

## 2017-10-22 DIAGNOSIS — L989 Disorder of the skin and subcutaneous tissue, unspecified: Secondary | ICD-10-CM | POA: Diagnosis not present

## 2017-10-22 DIAGNOSIS — F419 Anxiety disorder, unspecified: Secondary | ICD-10-CM | POA: Diagnosis not present

## 2017-10-30 DIAGNOSIS — F439 Reaction to severe stress, unspecified: Secondary | ICD-10-CM | POA: Diagnosis not present

## 2017-10-30 DIAGNOSIS — L981 Factitial dermatitis: Secondary | ICD-10-CM | POA: Diagnosis not present

## 2017-10-30 DIAGNOSIS — L299 Pruritus, unspecified: Secondary | ICD-10-CM | POA: Diagnosis not present

## 2017-11-06 DIAGNOSIS — F419 Anxiety disorder, unspecified: Secondary | ICD-10-CM | POA: Diagnosis not present

## 2017-11-06 DIAGNOSIS — L299 Pruritus, unspecified: Secondary | ICD-10-CM | POA: Diagnosis not present

## 2017-11-08 DIAGNOSIS — L281 Prurigo nodularis: Secondary | ICD-10-CM | POA: Diagnosis not present

## 2017-11-18 ENCOUNTER — Other Ambulatory Visit: Payer: Self-pay | Admitting: Family Medicine

## 2017-11-18 DIAGNOSIS — F22 Delusional disorders: Secondary | ICD-10-CM

## 2017-11-18 DIAGNOSIS — F419 Anxiety disorder, unspecified: Secondary | ICD-10-CM | POA: Diagnosis not present

## 2017-11-18 DIAGNOSIS — L03115 Cellulitis of right lower limb: Secondary | ICD-10-CM | POA: Diagnosis not present

## 2017-11-18 NOTE — Telephone Encounter (Signed)
Patient needs to schedule an appointment for refill of Risperdal.   She was to follow up in 2 weeks back in September.       Ask patient how long she has been out of medication?

## 2017-11-29 DIAGNOSIS — L281 Prurigo nodularis: Secondary | ICD-10-CM | POA: Diagnosis not present

## 2017-12-11 DIAGNOSIS — R5383 Other fatigue: Secondary | ICD-10-CM | POA: Diagnosis not present

## 2017-12-11 DIAGNOSIS — Z79899 Other long term (current) drug therapy: Secondary | ICD-10-CM | POA: Diagnosis not present

## 2017-12-11 DIAGNOSIS — L0390 Cellulitis, unspecified: Secondary | ICD-10-CM | POA: Diagnosis not present

## 2017-12-11 DIAGNOSIS — L281 Prurigo nodularis: Secondary | ICD-10-CM | POA: Diagnosis not present

## 2017-12-11 DIAGNOSIS — R739 Hyperglycemia, unspecified: Secondary | ICD-10-CM | POA: Diagnosis not present

## 2017-12-12 DIAGNOSIS — K5900 Constipation, unspecified: Secondary | ICD-10-CM | POA: Diagnosis not present

## 2017-12-12 DIAGNOSIS — R3989 Other symptoms and signs involving the genitourinary system: Secondary | ICD-10-CM | POA: Diagnosis not present

## 2017-12-12 DIAGNOSIS — R0781 Pleurodynia: Secondary | ICD-10-CM | POA: Diagnosis not present

## 2017-12-12 DIAGNOSIS — F321 Major depressive disorder, single episode, moderate: Secondary | ICD-10-CM | POA: Diagnosis not present

## 2017-12-12 DIAGNOSIS — M129 Arthropathy, unspecified: Secondary | ICD-10-CM | POA: Diagnosis not present

## 2017-12-12 DIAGNOSIS — R1084 Generalized abdominal pain: Secondary | ICD-10-CM | POA: Diagnosis not present

## 2017-12-23 DIAGNOSIS — K581 Irritable bowel syndrome with constipation: Secondary | ICD-10-CM | POA: Diagnosis not present

## 2017-12-23 DIAGNOSIS — M129 Arthropathy, unspecified: Secondary | ICD-10-CM | POA: Diagnosis not present

## 2017-12-23 DIAGNOSIS — R1084 Generalized abdominal pain: Secondary | ICD-10-CM | POA: Diagnosis not present

## 2017-12-23 DIAGNOSIS — R3989 Other symptoms and signs involving the genitourinary system: Secondary | ICD-10-CM | POA: Diagnosis not present

## 2017-12-27 ENCOUNTER — Emergency Department (HOSPITAL_COMMUNITY): Payer: Medicare Other

## 2017-12-27 ENCOUNTER — Encounter (HOSPITAL_COMMUNITY): Payer: Self-pay | Admitting: Internal Medicine

## 2017-12-27 ENCOUNTER — Emergency Department (HOSPITAL_COMMUNITY)
Admission: EM | Admit: 2017-12-27 | Discharge: 2017-12-30 | Disposition: A | Discharge status: Other Acute Inpatient Hospital | Payer: Medicare Other | Attending: Emergency Medicine | Admitting: Emergency Medicine

## 2017-12-27 DIAGNOSIS — R45851 Suicidal ideations: Secondary | ICD-10-CM | POA: Insufficient documentation

## 2017-12-27 DIAGNOSIS — Z87891 Personal history of nicotine dependence: Secondary | ICD-10-CM | POA: Diagnosis not present

## 2017-12-27 DIAGNOSIS — Z046 Encounter for general psychiatric examination, requested by authority: Secondary | ICD-10-CM | POA: Diagnosis not present

## 2017-12-27 DIAGNOSIS — F3110 Bipolar disorder, current episode manic without psychotic features, unspecified: Secondary | ICD-10-CM | POA: Diagnosis not present

## 2017-12-27 DIAGNOSIS — F419 Anxiety disorder, unspecified: Secondary | ICD-10-CM | POA: Diagnosis not present

## 2017-12-27 DIAGNOSIS — R4587 Impulsiveness: Secondary | ICD-10-CM | POA: Diagnosis not present

## 2017-12-27 DIAGNOSIS — G4700 Insomnia, unspecified: Secondary | ICD-10-CM | POA: Diagnosis not present

## 2017-12-27 DIAGNOSIS — F319 Bipolar disorder, unspecified: Secondary | ICD-10-CM | POA: Diagnosis not present

## 2017-12-27 DIAGNOSIS — R451 Restlessness and agitation: Secondary | ICD-10-CM | POA: Insufficient documentation

## 2017-12-27 DIAGNOSIS — R4182 Altered mental status, unspecified: Secondary | ICD-10-CM | POA: Diagnosis not present

## 2017-12-27 DIAGNOSIS — R079 Chest pain, unspecified: Secondary | ICD-10-CM | POA: Diagnosis not present

## 2017-12-27 DIAGNOSIS — Z853 Personal history of malignant neoplasm of breast: Secondary | ICD-10-CM | POA: Diagnosis not present

## 2017-12-27 DIAGNOSIS — Z0000 Encounter for general adult medical examination without abnormal findings: Secondary | ICD-10-CM

## 2017-12-27 DIAGNOSIS — Z79899 Other long term (current) drug therapy: Secondary | ICD-10-CM | POA: Insufficient documentation

## 2017-12-27 DIAGNOSIS — R450 Nervousness: Secondary | ICD-10-CM | POA: Diagnosis not present

## 2017-12-27 DIAGNOSIS — F22 Delusional disorders: Secondary | ICD-10-CM | POA: Diagnosis not present

## 2017-12-27 DIAGNOSIS — R4020 Unspecified coma: Secondary | ICD-10-CM | POA: Diagnosis not present

## 2017-12-27 LAB — RAPID URINE DRUG SCREEN, HOSP PERFORMED
Amphetamines: NOT DETECTED
BARBITURATES: NOT DETECTED
BENZODIAZEPINES: NOT DETECTED
COCAINE: NOT DETECTED
OPIATES: NOT DETECTED
Tetrahydrocannabinol: NOT DETECTED

## 2017-12-27 LAB — CBC WITH DIFFERENTIAL/PLATELET
Basophils Absolute: 0 10*3/uL (ref 0.0–0.1)
Basophils Relative: 0 %
Eosinophils Absolute: 0.1 10*3/uL (ref 0.0–0.7)
Eosinophils Relative: 1 %
HEMATOCRIT: 41.5 % (ref 36.0–46.0)
Hemoglobin: 13.9 g/dL (ref 12.0–15.0)
LYMPHS ABS: 3.4 10*3/uL (ref 0.7–4.0)
LYMPHS PCT: 31 %
MCH: 30.5 pg (ref 26.0–34.0)
MCHC: 33.5 g/dL (ref 30.0–36.0)
MCV: 91.2 fL (ref 78.0–100.0)
MONO ABS: 0.8 10*3/uL (ref 0.1–1.0)
Monocytes Relative: 8 %
NEUTROS ABS: 6.6 10*3/uL (ref 1.7–7.7)
Neutrophils Relative %: 60 %
Platelets: 286 10*3/uL (ref 150–400)
RBC: 4.55 MIL/uL (ref 3.87–5.11)
RDW: 12.5 % (ref 11.5–15.5)
WBC: 10.8 10*3/uL — ABNORMAL HIGH (ref 4.0–10.5)

## 2017-12-27 LAB — URINALYSIS, ROUTINE W REFLEX MICROSCOPIC
BACTERIA UA: NONE SEEN
BILIRUBIN URINE: NEGATIVE
Glucose, UA: NEGATIVE mg/dL
HGB URINE DIPSTICK: NEGATIVE
Ketones, ur: NEGATIVE mg/dL
NITRITE: POSITIVE — AB
PROTEIN: NEGATIVE mg/dL
Specific Gravity, Urine: 1.006 (ref 1.005–1.030)
pH: 6 (ref 5.0–8.0)

## 2017-12-27 LAB — COMPREHENSIVE METABOLIC PANEL
ALK PHOS: 40 U/L (ref 38–126)
ALT: 24 U/L (ref 14–54)
ANION GAP: 11 (ref 5–15)
AST: 25 U/L (ref 15–41)
Albumin: 4.3 g/dL (ref 3.5–5.0)
BILIRUBIN TOTAL: 1.1 mg/dL (ref 0.3–1.2)
BUN: 17 mg/dL (ref 6–20)
CALCIUM: 9.2 mg/dL (ref 8.9–10.3)
CO2: 24 mmol/L (ref 22–32)
Chloride: 103 mmol/L (ref 101–111)
Creatinine, Ser: 0.95 mg/dL (ref 0.44–1.00)
GFR, EST NON AFRICAN AMERICAN: 59 mL/min — AB (ref >60)
Glucose, Bld: 140 mg/dL — ABNORMAL HIGH (ref 65–99)
POTASSIUM: 3.3 mmol/L — AB (ref 3.5–5.1)
Sodium: 138 mmol/L (ref 135–145)
TOTAL PROTEIN: 7.4 g/dL (ref 6.5–8.1)

## 2017-12-27 LAB — TROPONIN I: Troponin I: <0.03 ng/mL (ref <0.03)

## 2017-12-27 LAB — ACETAMINOPHEN LEVEL: Acetaminophen (Tylenol), Serum: <10 ug/mL — ABNORMAL LOW (ref 10–30)

## 2017-12-27 LAB — SALICYLATE LEVEL: Salicylate Lvl: <7 mg/dL (ref 2.8–30.0)

## 2017-12-27 LAB — ETHANOL

## 2017-12-27 MED ORDER — LORAZEPAM 2 MG/ML IJ SOLN
1.0000 mg | Freq: Once | INTRAMUSCULAR | Status: AC
  Administered 2017-12-27: 1 mg via INTRAVENOUS
  Filled 2017-12-27: qty 1

## 2017-12-27 MED ORDER — OLANZAPINE 5 MG PO TBDP
5.0000 mg | ORAL_TABLET | Freq: Every day | ORAL | Status: DC
  Administered 2017-12-27 (×2): 5 mg via ORAL
  Filled 2017-12-27 (×2): qty 1

## 2017-12-27 MED ORDER — LORAZEPAM 1 MG PO TABS
1.0000 mg | ORAL_TABLET | Freq: Once | ORAL | Status: AC
  Administered 2017-12-27: 1 mg via ORAL
  Filled 2017-12-27: qty 1

## 2017-12-27 NOTE — ED Notes (Signed)
Hourly rounding reveals patient sleeping in room. No complaints, stable, in no acute distress. Q15 minute rounds and monitoring via Security Cameras to continue. 

## 2017-12-27 NOTE — ED Notes (Signed)
Patient tolerated IM injection well and was cooperative for this RN.

## 2017-12-27 NOTE — ED Notes (Signed)
Snack and beverage given. 

## 2017-12-27 NOTE — ED Provider Notes (Signed)
[facility] DEPT Provider Note   CSN: [id number] Arrival date & time: 12/27/17  1651     History   Chief Complaint No chief complaint on file.   HPI [name] is a 72 y.o. female.  The history is provided by the patient. No language interpreter was used.    [name] is a 72 y.o. female who presents to the Emergency Department complaining of psychiatric evaluation.  She is brought in by police department for psychiatric evaluation.  Her friends called 49 because she had been texting at that she wanted to stab herself and if she had a gun she would shoot herself.  Police noted that she had a knife in her hand when they presented to her home but she promptly dropped it.  Level 5 caveat due to psychiatric illness.  Past Medical History:  Diagnosis Date  . Allergy   . Anemia   . Anxiety   . Arthritis   . Astigmatism   . Bursitis   . Bursitis of hip   . Cancer ([place]) 10/2006   Invasive ductal carcinoma of the right breast   . Carpal tunnel syndrome   . Cataracts, bilateral   . Colitis   . Depression   . 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 Endometriosis   . Fibromyalgia   . Gall stones   . GERD (gastroesophageal reflux disease)   . Hyperlipidemia   . Pneumonia   . Tendon tear     Patient Active Problem List   Diagnosis Date Noted  . Breast cancer, right breast ([place]) 02/04/2014  . ANEMIA 08/22/2010  . DEPRESSION 08/22/2010  . HIATAL HERNIA 08/22/2010  . DERMATITIS 08/22/2010  . CHEST PAIN 08/22/2010  . HYPERGLYCEMIA 08/22/2010  . HYPERLIPIDEMIA 09/16/2008  . ANXIETY DEPRESSION 09/16/2008  . HX OF GALLSTONE 09/16/2008    Past Surgical History:  Procedure Laterality Date  . APPENDECTOMY    . BREAST SURGERY Right 12/11/2006   Lumpectomy  . CARPAL TUNNEL RELEASE    . CYST EXCISION    . ELBOW SURGERY    . LUMBAR FUSION      OB History    No data available       Home Medications    Prior to Admission medications     Medication Sig Start Date End Date Taking? Authorizing Provider  albuterol (PROAIR HFA) 108 (90 Base) MCG/ACT inhaler Inhale 2 puffs into the lungs every 4 (four) hours as needed for wheezing or shortness of breath. 03/09/17   Scot Jun, FNP  benzonatate (TESSALON) 100 MG capsule Take 1-2 capsules (100-200 mg total) by mouth 3 (three) times daily as needed for cough. Patient not taking: Reported on 09/02/2017 05/15/17   Tenna Delaine D, PA-C  buPROPion (WELLBUTRIN XL) 300 MG 24 hr tablet Take 300 mg by mouth daily.      [provider]  cetirizine (ZYRTEC) 10 MG tablet Take 10 mg by mouth daily.      [provider]  cyclobenzaprine (FLEXERIL) 10 MG tablet Take 10 mg by mouth at bedtime.  10/24/13   [provider]  escitalopram (LEXAPRO) 20 MG tablet Take 20 mg by mouth daily.     [provider]  fluticasone (FLONASE) 50 MCG/ACT nasal spray Place 2 sprays into both nostrils daily.  10/28/13   [provider]  lansoprazole (PREVACID) 15 MG capsule Take 15 mg by mouth daily.     [provider]  LORazepam (ATIVAN) 0.5 MG  tablet Take 1 mg by mouth at bedtime.  10/30/13   [provider]  ondansetron (ZOFRAN) 8 MG tablet Take 8 mg by mouth every 8 (eight) hours as needed.  09/04/13   [provider]  risperiDONE (RISPERDAL) 0.5 MG tablet Take 1 tablet (0.5 mg total) by mouth 2 (two) times daily. 09/02/17   Rutherford Guys, MD  simvastatin (ZOCOR) 20 MG tablet Take 20 mg by mouth at bedtime.      [provider]  traZODone (DESYREL) 50 MG tablet Take 50 mg by mouth at bedtime.  10/30/13   [provider]    Family History Family History  Problem Relation Age of Onset  . Cancer Mother   . Heart disease Mother   . Cancer Father        Colon cancer  . Heart disease Father   . Transient ischemic attack Father   . Heart disease Brother   . Heart disease Brother   . Vision loss Sister     Social  History Social History   Tobacco Use  . Smoking status: Former Research scientist (life sciences)  . Smokeless tobacco: Never Used  . Tobacco comment: 3-4 packs per day x 30 years  Substance Use Topics  . Alcohol use: No    Comment: AA since 1989  . Drug use: No    Comment: stopped 1989     Allergies   Augmentin [amoxicillin-pot clavulanate] and Sulfa drugs cross reactors   Review of Systems Review of Systems  All other systems reviewed and are negative.    Physical Exam Updated Vital Signs There were no vitals taken for this visit.  Physical Exam  Constitutional: She is oriented to person, place, and time. She appears well-developed and well-nourished.  HENT:  Head: Normocephalic and atraumatic.  Cardiovascular: Normal rate and regular rhythm.  No murmur heard. Pulmonary/Chest: Effort normal and breath sounds normal. No respiratory distress.  Abdominal: Soft. There is no tenderness. There is no rebound and no guarding.  Musculoskeletal: She exhibits no edema or tenderness.  Neurological: She is alert and oriented to person, place, and time.  Skin: Skin is warm and dry.  Psychiatric:  Mildly agitated with pressured speech and tangential thought process.  Nursing note and vitals reviewed.    ED Treatments / Results  Labs (all labs ordered are listed, but only abnormal results are displayed) Labs Reviewed  COMPREHENSIVE METABOLIC PANEL  ETHANOL  RAPID URINE DRUG SCREEN, HOSP PERFORMED  CBC WITH DIFFERENTIAL/PLATELET  SALICYLATE LEVEL  ACETAMINOPHEN LEVEL  URINALYSIS, ROUTINE W REFLEX MICROSCOPIC    EKG  EKG Interpretation None       Radiology No results found.  Procedures Procedures (including critical care time)  Medications Ordered in ED Medications  OLANZapine zydis (ZYPREXA) disintegrating tablet 5 mg (not administered)     Initial Impression / Assessment and Plan / ED Course  I have reviewed the triage vital signs and the nursing notes.  Pertinent labs &  imaging results that were available during my care of the patient were reviewed by me and considered in my medical decision making (see chart for details).     Patient brought in for psychiatric evaluation.  On ED of arrival she has pressured speech and is difficult to redirect, mildly agitated.  She did improve after medications.  UA is not consistent with UTI as patient did take Azo.  Will send urine culture to evaluate if true UTI.  IVC completed for patient safety she as threatening to harm  herself.  She has been medically cleared for psychiatric evaluation and treatment.  Final Clinical Impressions(s) / ED Diagnoses   Final diagnoses:  None    ED Discharge Orders    None       Quintella Reichert, MD 12/28/17 [phone]

## 2017-12-27 NOTE — ED Triage Notes (Addendum)
Pt arrived via [employer]. [employer] received call this morning stating that the patient wanted to commit suicide. Pt then denied suicidal thoughts for [employer]. Later in the day, [employer] called back out to patient's home and found patient holding a knife threatening to kill herself. Patient currently manic and refusing to answer questions appropriately.   Per family, patient was "fine" before Christmas on December 19th. Pt prescribed prednisone by physician at [facility]. Family reports altered mental status since beginning predisone.

## 2017-12-27 NOTE — ED Notes (Signed)
Pt. Transferred to [facility] from ED to room 37 after screening for contraband. Report to include Situation, Background, Assessment and Recommendations from RN. Pt. Oriented to unit including Q15 minute rounds as well as the security cameras for their protection. Patient is alert and oriented, warm and dry in no acute distress. Patient denies SI, HI, and AVH. Pt. Encouraged to let me know if needs arise.
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
## 2017-12-27 NOTE — ED Notes (Signed)
Hourly rounding reveals patient in room. No complaints, stable, in no acute distress. Q15 minute rounds and monitoring via Security Cameras to continue. 

## 2017-12-27 NOTE — ED Notes (Signed)
PATIENT ENT TO USE RESTROOM BUT WAS UNABLE TO PROVIDE A SAMPLE

## 2017-12-28 ENCOUNTER — Emergency Department (HOSPITAL_COMMUNITY): Payer: Medicare Other

## 2017-12-28 DIAGNOSIS — Z87891 Personal history of nicotine dependence: Secondary | ICD-10-CM

## 2017-12-28 DIAGNOSIS — F3110 Bipolar disorder, current episode manic without psychotic features, unspecified: Secondary | ICD-10-CM | POA: Diagnosis not present

## 2017-12-28 DIAGNOSIS — R450 Nervousness: Secondary | ICD-10-CM | POA: Diagnosis not present

## 2017-12-28 DIAGNOSIS — G4700 Insomnia, unspecified: Secondary | ICD-10-CM

## 2017-12-28 DIAGNOSIS — F419 Anxiety disorder, unspecified: Secondary | ICD-10-CM

## 2017-12-28 DIAGNOSIS — R45851 Suicidal ideations: Secondary | ICD-10-CM | POA: Diagnosis not present

## 2017-12-28 DIAGNOSIS — R079 Chest pain, unspecified: Secondary | ICD-10-CM | POA: Diagnosis not present

## 2017-12-28 MED ORDER — LORAZEPAM 1 MG PO TABS
1.0000 mg | ORAL_TABLET | Freq: Three times a day (TID) | ORAL | Status: DC | PRN
  Administered 2017-12-28 – 2017-12-29 (×3): 1 mg via ORAL
  Filled 2017-12-28 (×3): qty 1

## 2017-12-28 MED ORDER — ETODOLAC 200 MG PO CAPS
200.0000 mg | ORAL_CAPSULE | Freq: Three times a day (TID) | ORAL | Status: DC
  Administered 2017-12-28 – 2017-12-29 (×5): 200 mg via ORAL
  Filled 2017-12-28 (×8): qty 1

## 2017-12-28 MED ORDER — LORAZEPAM 0.5 MG PO TABS
0.5000 mg | ORAL_TABLET | Freq: Two times a day (BID) | ORAL | Status: DC
  Administered 2017-12-28: 0.5 mg via ORAL
  Filled 2017-12-28: qty 1

## 2017-12-28 MED ORDER — TRAZODONE HCL 100 MG PO TABS
100.0000 mg | ORAL_TABLET | Freq: Every evening | ORAL | Status: DC | PRN
  Administered 2017-12-28 – 2017-12-29 (×2): 100 mg via ORAL
  Filled 2017-12-28 (×2): qty 1

## 2017-12-28 MED ORDER — LORAZEPAM 1 MG PO TABS
1.0000 mg | ORAL_TABLET | Freq: Once | ORAL | Status: AC
  Administered 2017-12-28: 1 mg via ORAL
  Filled 2017-12-28: qty 1

## 2017-12-28 MED ORDER — ALBUTEROL SULFATE (2.5 MG/3ML) 0.083% IN NEBU: 2.5000 mg | INHALATION_SOLUTION | RESPIRATORY_TRACT | Status: DC | PRN

## 2017-12-28 MED ORDER — ALBUTEROL SULFATE HFA 108 (90 BASE) MCG/ACT IN AERS: 2.0000 | INHALATION_SPRAY | RESPIRATORY_TRACT | Status: DC | PRN

## 2017-12-28 MED ORDER — GABAPENTIN 100 MG PO CAPS
200.0000 mg | ORAL_CAPSULE | Freq: Two times a day (BID) | ORAL | Status: DC
  Administered 2017-12-28: 200 mg via ORAL
  Filled 2017-12-28: qty 2

## 2017-12-28 MED ORDER — GABAPENTIN 300 MG PO CAPS
300.0000 mg | ORAL_CAPSULE | Freq: Three times a day (TID) | ORAL | Status: DC
  Administered 2017-12-28 – 2017-12-29 (×5): 300 mg via ORAL
  Filled 2017-12-28 (×5): qty 1

## 2017-12-28 NOTE — ED Notes (Signed)
Hourly rounding reveals patient in hall, manic, somewhat redirectable. No complaints, stable, in no acute distress. Q15 minute rounds and monitoring via [employer] to continue.

## 2017-12-28 NOTE — ED Notes (Signed)
Hourly rounding reveals patient sleeping in room. No complaints, stable, in no acute distress. Q15 minute rounds and monitoring via Security Cameras to continue. 

## 2017-12-28 NOTE — ED Notes (Signed)
Pt is very tearful and irritable.  She is restless and complaining of aching, constipation, and unable to urinate.  Pt refuses to lay down and try to sleep although she continues to state that she just needs to sleep.  Md notified.

## 2017-12-28 NOTE — BHH Counselor (Signed)
Clinician faxed the pt to the following inpatient treatment facilities:     [facility].  [facility].  [facility].  [facility].  [facility].      TTS will continue to seek placement.    Vertell Novak, MS, [facility], [facility] Triage Specialist [phone]

## 2017-12-28 NOTE — ED Notes (Signed)
Report to include Situation, Background, Assessment, and Recommendations received from Edie RN. Patient alert and oriented, warm and dry, in no acute distress. Patient denies SI, HI, AVH and pain. Patient made aware of Q15 minute rounds and security cameras for their safety. Patient instructed to come to me with needs or concerns. 

## 2017-12-28 NOTE — Consult Note (Signed)
[facility] Consult   Reason for Consult:  Manic episode, suicidal thoughts Referring Physician:  EDP Patient Identification: [name] MRN:  [id number] Principal Diagnosis: Bipolar affective disorder, manic ([place]) Diagnosis:   Patient Active Problem List   Diagnosis Date Noted  . Bipolar affective disorder, manic ([place]) [F31.10] 12/28/2017  . Breast cancer, right breast ([place]) [C50.911] 02/04/2014  . ANEMIA [D64.9] 08/22/2010  . DEPRESSION [F32.9] 08/22/2010  . HIATAL HERNIA [K44.9] 08/22/2010  . DERMATITIS [L25.9] 08/22/2010  . CHEST PAIN [R07.9] 08/22/2010  . HYPERGLYCEMIA [R73.09] 08/22/2010  . HYPERLIPIDEMIA [E78.5] 09/16/2008  . ANXIETY DEPRESSION [F34.1] 09/16/2008  . HX OF GALLSTONE [Z87.19] 09/16/2008    Total Time spent with patient: 45 minutes  Subjective:   [name] is a 72 y.o. female patient admitted manic behavior and suicidal thoughts.  HPI:  72 y.o. female who reports history of mental illness and took medication Mellaril for 12 years for Bipolar disorder. She was brought to [facility] ED via [facility] under IVC for evaluation. Patient reports that her behavior changed since she received '' 3 shots'' of Prednisone for arthritis from her doctor. Patient presents with manic behavior, pressure speech, tangential, restless, intrusive, high energy and endorses racing thought, inability to sleep for the past few day and paranoid/grandiose delusions. Patient states that she requested to see a psychiatrist for evaluation because her friends on Facebook asked her to seek help after she sent her friend 3000 text messages yesterday saying she that '' All I wanted was vegetable soup" and " I feels like my brain is going to die". Per IVC paperwork  initiated by EDP: "Pt in ED after threatening to stab herself or shoot herself. She was found while knife in her hand. She is manic in ED". Patient denies drugs and alcohol abuse. She is talkative and paces the hallway back  and forth.  Past Psychiatric History: Bipolar disorder  Risk to Self: Suicidal Ideation: No(Per IVC pt wants to stab and shoot herself with a gun. ) Suicidal Intent: Yes-Currently Present(pt denies SI; Per IVC pt wanted to stab and shoot herself ) Is patient at risk for suicide?: (today; pt wanted to stab and shoot herself) Suicidal Plan?: (pt denies; Per IVC pt wants to shoot and stab herself ) Access to Means: (pt denies; Per IVC pt has access to knives at home) What has been your use of drugs/alcohol within the last 12 months?: N/A. How many times?: 0 Other Self Harm Risks: 0 Triggers for Past Attempts: None known Intentional Self Injurious Behavior: None Risk to Others: Homicidal Ideation: No Thoughts of Harm to Others: No Current Homicidal Intent: No Current Homicidal Plan: No Access to Homicidal Means: No Identified Victim: none History of harm to others?: No Assessment of Violence: None Noted Violent Behavior Description: none Does patient have access to weapons?: No Criminal Charges Pending?: No Does patient have a court date: No Prior Inpatient Therapy: Prior Inpatient Therapy: Yes Prior Therapy Dates: (pt stated she was last hosptalized in 2009) Prior Therapy Facilty/Provider(s): ([facility] ) Reason for Treatment: Mental Illness  Prior Outpatient Therapy: Prior Outpatient Therapy: No Prior Therapy Dates: (UTA ) Prior Therapy Facilty/Provider(s): (Pt stated 2009) Reason for Treatment: (Mental Health ) Does patient have an ACCT team?: No Does patient have Intensive In-House Services?  : No Does patient have [facility] services? : No Does patient have P4CC services?: No  Past Medical History:  Past Medical History:  Diagnosis Date  . Allergy   . Anemia   .  Anxiety   . Arthritis   . Astigmatism   . Bursitis   . Bursitis of hip   . Cancer ([place]) 10/2006   Invasive ductal carcinoma of the right breast   . Carpal tunnel syndrome   . Cataracts, bilateral   . Colitis    . Depression   . 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 Endometriosis   . Fibromyalgia   . Gall stones   . GERD (gastroesophageal reflux disease)   . Hyperlipidemia   . Pneumonia   . Tendon tear     Past Surgical History:  Procedure Laterality Date  . APPENDECTOMY    . BREAST SURGERY Right 12/11/2006   Lumpectomy  . CARPAL TUNNEL RELEASE    . CYST EXCISION    . ELBOW SURGERY    . LUMBAR FUSION     Family History:  Family History  Problem Relation Age of Onset  . Cancer Mother   . Heart disease Mother   . Cancer Father        Colon cancer  . Heart disease Father   . Transient ischemic attack Father   . Heart disease Brother   . Heart disease Brother   . Vision loss Sister    Family Psychiatric  History:  Social History:  Social History   Substance and Sexual Activity  Alcohol Use No   Comment: AA since 1989     Social History   Substance and Sexual Activity  Drug Use No   Comment: stopped 1989    Social History   Socioeconomic History  . Marital status: Divorced    Spouse name: None  . Number of children: None  . Years of education: None  . Highest education level: None  Social Needs  . Financial resource strain: None  . Food insecurity - worry: None  . Food insecurity - inability: None  . Transportation needs - medical: None  . Transportation needs - non-medical: None  Occupational History  . None  Tobacco Use  . Smoking status: Former Research scientist (life sciences)  . Smokeless tobacco: Never Used  . Tobacco comment: 3-4 packs per day x 30 years  Substance and Sexual Activity  . Alcohol use: No    Comment: AA since 1989  . Drug use: No    Comment: stopped 1989  . Sexual activity: No    Birth control/protection: Post-menopausal  Other Topics Concern  . None  Social History Narrative   Lives at home   Additional Social History:    Allergies:   Allergies  Allergen Reactions  . Augmentin [Amoxicillin-Pot Clavulanate]   . Sulfa Drugs Cross Reactors     Labs:   Results for orders placed or performed during the hospital encounter of 12/27/17 (from the past 48 hour(s))  Comprehensive metabolic panel     Status: Abnormal   Collection Time: 12/27/17  4:57 PM  Result Value Ref Range   Sodium 138 135 - 145 mmol/L   Potassium 3.3 (L) 3.5 - 5.1 mmol/L   Chloride 103 101 - 111 mmol/L   CO2 24 22 - 32 mmol/L   Glucose, Bld 140 (H) 65 - 99 mg/dL   BUN 17 6 - 20 mg/dL   Creatinine, Ser 0.95 0.44 - 1.00 mg/dL   Calcium 9.2 8.9 - 10.3 mg/dL   Total Protein 7.4 6.5 - 8.1 g/dL   Albumin 4.3 3.5 - 5.0 g/dL   AST 25 15 - 41 U/L   ALT 24 14 - 54 U/L  Alkaline Phosphatase 40 38 - 126 U/L   Total Bilirubin 1.1 0.3 - 1.2 mg/dL   GFR calc non Af Amer 59 (L) >60 mL/min   GFR calc Af Amer >60 >60 mL/min    Comment: (NOTE) The eGFR has been calculated using the CKD EPI equation. This calculation has not been validated in all clinical situations. eGFR's persistently <60 mL/min signify possible Chronic Kidney Disease.    Anion gap 11 5 - 15  Ethanol     Status: None   Collection Time: 12/27/17  4:57 PM  Result Value Ref Range   Alcohol, Ethyl (B) <10 <10 mg/dL    Comment:        LOWEST DETECTABLE LIMIT FOR SERUM ALCOHOL IS 10 mg/dL FOR MEDICAL PURPOSES ONLY   CBC with Diff     Status: Abnormal   Collection Time: 12/27/17  4:57 PM  Result Value Ref Range   WBC 10.8 (H) 4.0 - 10.5 K/uL   RBC 4.55 3.87 - 5.11 MIL/uL   Hemoglobin 13.9 12.0 - 15.0 g/dL   HCT 41.5 36.0 - 46.0 %   MCV 91.2 78.0 - 100.0 fL   MCH 30.5 26.0 - 34.0 pg   MCHC 33.5 30.0 - 36.0 g/dL   RDW 12.5 11.5 - 15.5 %   Platelets 286 150 - 400 K/uL   Neutrophils Relative % 60 %   Neutro Abs 6.6 1.7 - 7.7 K/uL   Lymphocytes Relative 31 %   Lymphs Abs 3.4 0.7 - 4.0 K/uL   Monocytes Relative 8 %   Monocytes Absolute 0.8 0.1 - 1.0 K/uL   Eosinophils Relative 1 %   Eosinophils Absolute 0.1 0.0 - 0.7 K/uL   Basophils Relative 0 %   Basophils Absolute 0.0 0.0 - 0.1 K/uL  Salicylate level      Status: None   Collection Time: 12/27/17  4:57 PM  Result Value Ref Range   Salicylate Lvl <6.8 2.8 - 30.0 mg/dL  Acetaminophen level     Status: Abnormal   Collection Time: 12/27/17  4:57 PM  Result Value Ref Range   Acetaminophen (Tylenol), Serum <10 (L) 10 - 30 ug/mL    Comment:        THERAPEUTIC CONCENTRATIONS VARY SIGNIFICANTLY. A RANGE OF 10-30 ug/mL MAY BE AN EFFECTIVE CONCENTRATION FOR MANY PATIENTS. HOWEVER, SOME ARE BEST TREATED AT CONCENTRATIONS OUTSIDE THIS RANGE. ACETAMINOPHEN CONCENTRATIONS >150 ug/mL AT 4 HOURS AFTER INGESTION AND >50 ug/mL AT 12 HOURS AFTER INGESTION ARE OFTEN ASSOCIATED WITH TOXIC REACTIONS.   Troponin I     Status: None   Collection Time: 12/27/17  4:57 PM  Result Value Ref Range   Troponin I <0.03 <0.03 ng/mL  Urine rapid drug screen (hosp performed)     Status: None   Collection Time: 12/27/17  8:12 PM  Result Value Ref Range   Opiates NONE DETECTED NONE DETECTED   Cocaine NONE DETECTED NONE DETECTED   Benzodiazepines NONE DETECTED NONE DETECTED   Amphetamines NONE DETECTED NONE DETECTED   Tetrahydrocannabinol NONE DETECTED NONE DETECTED   Barbiturates NONE DETECTED NONE DETECTED    Comment: (NOTE) DRUG SCREEN FOR MEDICAL PURPOSES ONLY.  IF CONFIRMATION IS NEEDED FOR ANY PURPOSE, NOTIFY LAB WITHIN 5 DAYS. LOWEST DETECTABLE LIMITS FOR URINE DRUG SCREEN Drug Class                     Cutoff (ng/mL) Amphetamine and metabolites    1000 Barbiturate and metabolites    200 Benzodiazepine  194 Tricyclics and metabolites     300 Opiates and metabolites        300 Cocaine and metabolites        300 THC                            50   Urinalysis, Routine w reflex microscopic     Status: Abnormal   Collection Time: 12/27/17  8:12 PM  Result Value Ref Range   Color, Urine AMBER (A) YELLOW    Comment: BIOCHEMICALS MAY BE AFFECTED BY COLOR   APPearance CLEAR CLEAR   Specific Gravity, Urine 1.006 1.005 - 1.030   pH 6.0  5.0 - 8.0   Glucose, UA NEGATIVE NEGATIVE mg/dL   Hgb urine dipstick NEGATIVE NEGATIVE   Bilirubin Urine NEGATIVE NEGATIVE   Ketones, ur NEGATIVE NEGATIVE mg/dL   Protein, ur NEGATIVE NEGATIVE mg/dL   Nitrite POSITIVE (A) NEGATIVE   Leukocytes, UA TRACE (A) NEGATIVE   RBC / HPF 0-5 0 - 5 RBC/hpf   WBC, UA 0-5 0 - 5 WBC/hpf   Bacteria, UA NONE SEEN NONE SEEN   Squamous Epithelial / LPF 0-5 (A) NONE SEEN    Current Facility-Administered Medications  Medication Dose Route Frequency Provider Last Rate Last Dose  . albuterol (PROVENTIL) (2.5 MG/3ML) 0.083% nebulizer solution 2.5 mg  2.5 mg Nebulization Q4H PRN Uel Davidow, MD      . gabapentin (NEURONTIN) capsule 200 mg  200 mg Oral BID Corneisha Alvi, MD      . LORazepam (ATIVAN) tablet 0.5 mg  0.5 mg Oral BID Patsy Varma, MD      . traZODone (DESYREL) tablet 100 mg  100 mg Oral QHS PRN Corena Pilgrim, MD       Current Outpatient Medications  Medication Sig Dispense Refill  . albuterol (PROAIR HFA) 108 (90 Base) MCG/ACT inhaler Inhale 2 puffs into the lungs every 4 (four) hours as needed for wheezing or shortness of breath. 1 Inhaler 1  . buPROPion (WELLBUTRIN XL) 300 MG 24 hr tablet Take 300 mg by mouth daily.      . cetirizine (ZYRTEC) 10 MG tablet Take 10 mg by mouth daily.      Marland Kitchen escitalopram (LEXAPRO) 20 MG tablet Take 20 mg by mouth daily.     . fluticasone (FLONASE) 50 MCG/ACT nasal spray Place 2 sprays into both nostrils daily.     Marland Kitchen LORazepam (ATIVAN) 0.5 MG tablet Take 1 mg by mouth at bedtime.     . ondansetron (ZOFRAN) 8 MG tablet Take 8 mg by mouth every 8 (eight) hours as needed.     . phenazopyridine (PYRIDIUM) 97 MG tablet Take 97 mg by mouth at bedtime.    . simvastatin (ZOCOR) 20 MG tablet Take 20 mg by mouth at bedtime.      . traZODone (DESYREL) 50 MG tablet Take 150 mg by mouth at bedtime.     . Vitamin D, Ergocalciferol, (DRISDOL) 50000 units CAPS capsule Take 50,000 Units by mouth every 7 (seven)  days. wednesday    . benzonatate (TESSALON) 100 MG capsule Take 1-2 capsules (100-200 mg total) by mouth 3 (three) times daily as needed for cough. (Patient not taking: Reported on 09/02/2017) 40 capsule 0  . lansoprazole (PREVACID) 15 MG capsule Take 15 mg by mouth daily.     . risperiDONE (RISPERDAL) 0.5 MG tablet Take 1 tablet (0.5 mg total) by mouth 2 (two) times daily. 60 tablet  0    Musculoskeletal: Strength & Muscle Tone: within normal limits Gait & Station: normal Patient leans: N/A  Psychiatric Specialty Exam: Physical Exam  Psychiatric: Her affect is labile. Her speech is rapid and/or pressured and tangential. She is hyperactive. Thought content is paranoid. Cognition and memory are normal. She expresses impulsivity. She expresses suicidal ideation.    Review of Systems  Constitutional: Negative.   HENT: Negative.   Eyes: Negative.   Respiratory: Negative.   Cardiovascular: Negative.   Gastrointestinal: Negative.   Genitourinary: Negative.   Musculoskeletal: Negative.   Skin: Negative.   Neurological: Negative.   Endo/Heme/Allergies: Negative.   Psychiatric/Behavioral: Positive for suicidal ideas. The patient is nervous/anxious and has insomnia.     Blood pressure (!) 128/97, pulse 93, temperature 98.4 F (36.9 C), temperature source Oral, resp. rate 20, SpO2 97 %.There is no height or weight on file to calculate BMI.  General Appearance: Casual  Eye Contact:  Good  Speech:  Clear and Coherent and Pressured  Volume:  Increased  Mood:  Euphoric  Affect:  Labile and Full Range  Thought Process:  Disorganized  Orientation:  Full (Time, Place, and Person)  Thought Content:  Paranoid Ideation  Suicidal Thoughts:  Yes.  without intent/plan  Homicidal Thoughts:  No  Memory:  Immediate;   Fair Recent;   Good Remote;   Good  Judgement:  Poor  Insight:  Shallow  Psychomotor Activity:  Increased  Concentration:  Concentration: Fair and Attention Span: Fair  Recall:   Good  Fund of Knowledge:  Good  Language:  Good  Akathisia:  No  Handed:  Right  AIMS (if indicated):     Assets:  Communication Skills Desire for Improvement  ADL's:  Intact  Cognition:  WNL  Sleep:   poor     Treatment Plan Summary: Daily contact with patient to assess and evaluate symptoms and progress in treatment and Medication management Start Gabapentin 200 mg bid , Lorazepam 0.5 mg bid for agitation/Bipolar. Trazodone 100 mg qhs as needed for sleep.  Disposition: Recommend psychiatric Inpatient admission when medically cleared.  Corena Pilgrim, MD 12/28/2017 11:08 AM

## 2017-12-28 NOTE — ED Notes (Signed)
Pt is very manic.  She states "I cant stop talking or writing"  Pt also stated she was too weak to walk.  Pt however is very active walking around department very hyperactive.

## 2017-12-28 NOTE — ED Notes (Signed)
Hourly rounding reveals patient in room. C/o insomnia, stable, in no acute distress. Q15 minute rounds and monitoring via [employer] to continue.

## 2017-12-29 DIAGNOSIS — Z87891 Personal history of nicotine dependence: Secondary | ICD-10-CM | POA: Diagnosis not present

## 2017-12-29 DIAGNOSIS — R4587 Impulsiveness: Secondary | ICD-10-CM | POA: Diagnosis not present

## 2017-12-29 DIAGNOSIS — F22 Delusional disorders: Secondary | ICD-10-CM | POA: Diagnosis not present

## 2017-12-29 DIAGNOSIS — R451 Restlessness and agitation: Secondary | ICD-10-CM

## 2017-12-29 DIAGNOSIS — F3110 Bipolar disorder, current episode manic without psychotic features, unspecified: Secondary | ICD-10-CM | POA: Diagnosis not present

## 2017-12-29 DIAGNOSIS — F419 Anxiety disorder, unspecified: Secondary | ICD-10-CM | POA: Diagnosis not present

## 2017-12-29 DIAGNOSIS — R450 Nervousness: Secondary | ICD-10-CM | POA: Diagnosis not present

## 2017-12-29 MED ORDER — LORAZEPAM 1 MG PO TABS
1.0000 mg | ORAL_TABLET | Freq: Once | ORAL | Status: AC
  Administered 2017-12-29: 1 mg via ORAL
  Filled 2017-12-29: qty 1

## 2017-12-29 MED ORDER — ASENAPINE MALEATE 5 MG SL SUBL
5.0000 mg | SUBLINGUAL_TABLET | Freq: Two times a day (BID) | SUBLINGUAL | Status: DC | PRN
  Administered 2017-12-29 – 2017-12-30 (×2): 5 mg via SUBLINGUAL
  Filled 2017-12-29 (×2): qty 1

## 2017-12-29 NOTE — ED Notes (Signed)
Hourly rounding reveals patient in rest room. No complaints, stable, in no acute distress. Q15 minute rounds and monitoring via [employer] Cam[employer] to continue. 

## 2017-12-29 NOTE — ED Notes (Signed)
Hourly rounding reveals patient sleeping in room. No complaints, stable, in no acute distress. Q15 minute rounds and monitoring via Security Cameras to continue. 

## 2017-12-29 NOTE — BH Assessment (Signed)
Received call from [place] at [facility] stating Pt has been accepted to their [facility] by Dr. Enzo Bi. Number for nursing report is  [phone]. Bed will not be available until 12/30/17 after 0700. Notified Honor Loh, RN of acceptance.   Orpah Greek Anson Fret, LPC, [facility], [facility] Triage Specialist [phone]

## 2017-12-29 NOTE — ED Notes (Signed)
Report to include Situation, Background, Assessment, and Recommendations received from [facility]. Patient alert, warm and dry, in no acute distress. Patient denies SI, HI, AVH and pain. Patient made aware of Q15 minute rounds and security cameras for their safety. Patient instructed to come to me with needs or concerns.

## 2017-12-29 NOTE — ED Notes (Signed)
Hourly rounding reveals patient in room. No complaints, stable, in no acute distress. Q15 minute rounds and monitoring via Security Cameras to continue. 

## 2017-12-29 NOTE — ED Notes (Signed)
Call placed to NP requesting medication, patient is walking the halls and interfering with other patients while on phone.  She is yelling at other patients in the wall way.

## 2017-12-29 NOTE — ED Notes (Signed)
Lunch trap placed at bedside

## 2017-12-29 NOTE — Consult Note (Signed)
[facility] Consult   Reason for Consult:  [place] Referring Physician:  EDP Patient Identification: [name] MRN:  [id number] Principal Diagnosis: Bipolar affective disorder, manic ([place]) Diagnosis:   Patient Active Problem List   Diagnosis Date Noted  . Bipolar affective disorder, manic ([place]) [F31.10] 12/28/2017  . Breast cancer, right breast ([place]) [C50.911] 02/04/2014  . ANEMIA [D64.9] 08/22/2010  . DEPRESSION [F32.9] 08/22/2010  . HIATAL HERNIA [K44.9] 08/22/2010  . DERMATITIS [L25.9] 08/22/2010  . CHEST PAIN [R07.9] 08/22/2010  . HYPERGLYCEMIA [R73.09] 08/22/2010  . HYPERLIPIDEMIA [E78.5] 09/16/2008  . ANXIETY DEPRESSION [F34.1] 09/16/2008  . HX OF GALLSTONE [Z87.19] 09/16/2008    Total Time spent with patient: 30 minutes  Subjective:   [name] is a 72 y.o. female patient admitted with manic behavior.  HPI:  Pt was seen and chart reviewed with treatment team and Dr Darleene Cleaver. Pt stated she slept last night and is feeling better today. Pt remains manic but is redirectable and non-violent. Pt can be intrusive at times. Pt would benefit from an inpatient psychiatric admission for crisis stabilization and medication management. Pt has been accepted at [facility] and bed will be available on 12-30-2017.   Past Psychiatric History: As above  Risk to Self: Suicidal Ideation: No(Per IVC pt wants to stab and shoot herself with a gun. ) Suicidal Intent: Yes-Currently Present(pt denies SI; Per IVC pt wanted to stab and shoot herself ) Is patient at risk for suicide?: (today; pt wanted to stab and shoot herself) Suicidal Plan?: (pt denies; Per IVC pt wants to shoot and stab herself ) Access to Means: (pt denies; Per IVC pt has access to knives at home) What has been your use of drugs/alcohol within the last 12 months?: N/A. How many times?: 0 Other Self Harm Risks: 0 Triggers for Past Attempts: None known Intentional Self Injurious Behavior: None Risk  to Others: Homicidal Ideation: No Thoughts of Harm to Others: No Current Homicidal Intent: No Current Homicidal Plan: No Access to Homicidal Means: No Identified Victim: none History of harm to others?: No Assessment of Violence: None Noted Violent Behavior Description: none Does patient have access to weapons?: No Criminal Charges Pending?: No Does patient have a court date: No Prior Inpatient Therapy: Prior Inpatient Therapy: Yes Prior Therapy Dates: (pt stated she was last hosptalized in 2009) Prior Therapy Facilty/Provider(s): ([facility] ) Reason for Treatment: Mental Illness  Prior Outpatient Therapy: Prior Outpatient Therapy: No Prior Therapy Dates: (UTA ) Prior Therapy Facilty/Provider(s): (Pt stated 2009) Reason for Treatment: (Mental Health ) Does patient have an ACCT team?: No Does patient have Intensive In-House Services?  : No Does patient have [facility] services? : No Does patient have P4CC services?: No  Past Medical History:  Past Medical History:  Diagnosis Date  . Allergy   . Anemia   . Anxiety   . Arthritis   . Astigmatism   . Bursitis   . Bursitis of hip   . Cancer ([place]) 10/2006   Invasive ductal carcinoma of the right breast   . Carpal tunnel syndrome   . Cataracts, bilateral   . Colitis   . Depression   . 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 Endometriosis   . Fibromyalgia   . Gall stones   . GERD (gastroesophageal reflux disease)   . Hyperlipidemia   . Pneumonia   . Tendon tear     Past Surgical History:  Procedure Laterality Date  . APPENDECTOMY    . BREAST  SURGERY Right 12/11/2006   Lumpectomy  . CARPAL TUNNEL RELEASE    . CYST EXCISION    . ELBOW SURGERY    . LUMBAR FUSION     Family History:  Family History  Problem Relation Age of Onset  . Cancer Mother   . Heart disease Mother   . Cancer Father        Colon cancer  . Heart disease Father   . Transient ischemic attack Father   . Heart disease Brother   . Heart disease Brother    . Vision loss Sister    Family Psychiatric  History: Unknown Social History:  Social History   Substance and Sexual Activity  Alcohol Use No   Comment: AA since 1989     Social History   Substance and Sexual Activity  Drug Use No   Comment: stopped 1989    Social History   Socioeconomic History  . Marital status: Divorced    Spouse name: None  . Number of children: None  . Years of education: None  . Highest education level: None  Social Needs  . Financial resource strain: None  . Food insecurity - worry: None  . Food insecurity - inability: None  . Transportation needs - medical: None  . Transportation needs - non-medical: None  Occupational History  . None  Tobacco Use  . Smoking status: Former Research scientist (life sciences)  . Smokeless tobacco: Never Used  . Tobacco comment: 3-4 packs per day x 30 years  Substance and Sexual Activity  . Alcohol use: No    Comment: AA since 1989  . Drug use: No    Comment: stopped 1989  . Sexual activity: No    Birth control/protection: Post-menopausal  Other Topics Concern  . None  Social History Narrative   Lives at home   Additional Social History:    Allergies:   Allergies  Allergen Reactions  . Augmentin [Amoxicillin-Pot Clavulanate]   . Sulfa Drugs Cross Reactors     Labs:  Results for orders placed or performed during the hospital encounter of 12/27/17 (from the past 48 hour(s))  Comprehensive metabolic panel     Status: Abnormal   Collection Time: 12/27/17  4:57 PM  Result Value Ref Range   Sodium 138 135 - 145 mmol/L   Potassium 3.3 (L) 3.5 - 5.1 mmol/L   Chloride 103 101 - 111 mmol/L   CO2 24 22 - 32 mmol/L   Glucose, Bld 140 (H) 65 - 99 mg/dL   BUN 17 6 - 20 mg/dL   Creatinine, Ser 0.95 0.44 - 1.00 mg/dL   Calcium 9.2 8.9 - 10.3 mg/dL   Total Protein 7.4 6.5 - 8.1 g/dL   Albumin 4.3 3.5 - 5.0 g/dL   AST 25 15 - 41 U/L   ALT 24 14 - 54 U/L   Alkaline Phosphatase 40 38 - 126 U/L   Total Bilirubin 1.1 0.3 - 1.2 mg/dL    GFR calc non Af Amer 59 (L) >60 mL/min   GFR calc Af Amer >60 >60 mL/min    Comment: (NOTE) The eGFR has been calculated using the CKD EPI equation. This calculation has not been validated in all clinical situations. eGFR's persistently <60 mL/min signify possible Chronic Kidney Disease.    Anion gap 11 5 - 15  Ethanol     Status: None   Collection Time: 12/27/17  4:57 PM  Result Value Ref Range   Alcohol, Ethyl (B) <10 <10 mg/dL    Comment:  LOWEST DETECTABLE LIMIT FOR SERUM ALCOHOL IS 10 mg/dL FOR MEDICAL PURPOSES ONLY   CBC with Diff     Status: Abnormal   Collection Time: 12/27/17  4:57 PM  Result Value Ref Range   WBC 10.8 (H) 4.0 - 10.5 K/uL   RBC 4.55 3.87 - 5.11 MIL/uL   Hemoglobin 13.9 12.0 - 15.0 g/dL   HCT 41.5 36.0 - 46.0 %   MCV 91.2 78.0 - 100.0 fL   MCH 30.5 26.0 - 34.0 pg   MCHC 33.5 30.0 - 36.0 g/dL   RDW 12.5 11.5 - 15.5 %   Platelets 286 150 - 400 K/uL   Neutrophils Relative % 60 %   Neutro Abs 6.6 1.7 - 7.7 K/uL   Lymphocytes Relative 31 %   Lymphs Abs 3.4 0.7 - 4.0 K/uL   Monocytes Relative 8 %   Monocytes Absolute 0.8 0.1 - 1.0 K/uL   Eosinophils Relative 1 %   Eosinophils Absolute 0.1 0.0 - 0.7 K/uL   Basophils Relative 0 %   Basophils Absolute 0.0 0.0 - 0.1 K/uL  Salicylate level     Status: None   Collection Time: 12/27/17  4:57 PM  Result Value Ref Range   Salicylate Lvl <1.6 2.8 - 30.0 mg/dL  Acetaminophen level     Status: Abnormal   Collection Time: 12/27/17  4:57 PM  Result Value Ref Range   Acetaminophen (Tylenol), Serum <10 (L) 10 - 30 ug/mL    Comment:        THERAPEUTIC CONCENTRATIONS VARY SIGNIFICANTLY. A RANGE OF 10-30 ug/mL MAY BE AN EFFECTIVE CONCENTRATION FOR MANY PATIENTS. HOWEVER, SOME ARE BEST TREATED AT CONCENTRATIONS OUTSIDE THIS RANGE. ACETAMINOPHEN CONCENTRATIONS >150 ug/mL AT 4 HOURS AFTER INGESTION AND >50 ug/mL AT 12 HOURS AFTER INGESTION ARE OFTEN ASSOCIATED WITH TOXIC REACTIONS.   Troponin I      Status: None   Collection Time: 12/27/17  4:57 PM  Result Value Ref Range   Troponin I <0.03 <0.03 ng/mL  Urine rapid drug screen (hosp performed)     Status: None   Collection Time: 12/27/17  8:12 PM  Result Value Ref Range   Opiates NONE DETECTED NONE DETECTED   Cocaine NONE DETECTED NONE DETECTED   Benzodiazepines NONE DETECTED NONE DETECTED   Amphetamines NONE DETECTED NONE DETECTED   Tetrahydrocannabinol NONE DETECTED NONE DETECTED   Barbiturates NONE DETECTED NONE DETECTED    Comment: (NOTE) DRUG SCREEN FOR MEDICAL PURPOSES ONLY.  IF CONFIRMATION IS NEEDED FOR ANY PURPOSE, NOTIFY LAB WITHIN 5 DAYS. LOWEST DETECTABLE LIMITS FOR URINE DRUG SCREEN Drug Class                     Cutoff (ng/mL) Amphetamine and metabolites    1000 Barbiturate and metabolites    200 Benzodiazepine                 384 Tricyclics and metabolites     300 Opiates and metabolites        300 Cocaine and metabolites        300 THC                            50   Urinalysis, Routine w reflex microscopic     Status: Abnormal   Collection Time: 12/27/17  8:12 PM  Result Value Ref Range   Color, Urine AMBER (A) YELLOW    Comment: BIOCHEMICALS MAY BE AFFECTED BY COLOR  APPearance CLEAR CLEAR   Specific Gravity, Urine 1.006 1.005 - 1.030   pH 6.0 5.0 - 8.0   Glucose, UA NEGATIVE NEGATIVE mg/dL   Hgb urine dipstick NEGATIVE NEGATIVE   Bilirubin Urine NEGATIVE NEGATIVE   Ketones, ur NEGATIVE NEGATIVE mg/dL   Protein, ur NEGATIVE NEGATIVE mg/dL   Nitrite POSITIVE (A) NEGATIVE   Leukocytes, UA TRACE (A) NEGATIVE   RBC / HPF 0-5 0 - 5 RBC/hpf   WBC, UA 0-5 0 - 5 WBC/hpf   Bacteria, UA NONE SEEN NONE SEEN   Squamous Epithelial / LPF 0-5 (A) NONE SEEN    Current Facility-Administered Medications  Medication Dose Route Frequency Provider Last Rate Last Dose  . albuterol (PROVENTIL) (2.5 MG/3ML) 0.083% nebulizer solution 2.5 mg  2.5 mg Nebulization Q4H PRN Makell Drohan, MD      . asenapine  (SAPHRIS) sublingual tablet 5 mg  5 mg Sublingual BID PRN Darleene Cleaver, Shyniece Scripter, MD   5 mg at 12/29/17 1144  . etodolac (LODINE) capsule 200 mg  200 mg Oral TID Ethelene Hal, NP   200 mg at 12/29/17 0943  . gabapentin (NEURONTIN) capsule 300 mg  300 mg Oral TID Corena Pilgrim, MD   300 mg at 12/29/17 0942  . LORazepam (ATIVAN) tablet 1 mg  1 mg Oral Q8H PRN Corena Pilgrim, MD   1 mg at 12/29/17 0942  . traZODone (DESYREL) tablet 100 mg  100 mg Oral QHS PRN Corena Pilgrim, MD   100 mg at 12/28/17 2102   Current Outpatient Medications  Medication Sig Dispense Refill  . albuterol (PROAIR HFA) 108 (90 Base) MCG/ACT inhaler Inhale 2 puffs into the lungs every 4 (four) hours as needed for wheezing or shortness of breath. 1 Inhaler 1  . buPROPion (WELLBUTRIN XL) 300 MG 24 hr tablet Take 300 mg by mouth daily.      . cetirizine (ZYRTEC) 10 MG tablet Take 10 mg by mouth daily.      Marland Kitchen escitalopram (LEXAPRO) 20 MG tablet Take 20 mg by mouth daily.     . fluticasone (FLONASE) 50 MCG/ACT nasal spray Place 2 sprays into both nostrils daily.     Marland Kitchen LORazepam (ATIVAN) 0.5 MG tablet Take 1 mg by mouth at bedtime.     . ondansetron (ZOFRAN) 8 MG tablet Take 8 mg by mouth every 8 (eight) hours as needed.     . phenazopyridine (PYRIDIUM) 97 MG tablet Take 97 mg by mouth at bedtime.    . simvastatin (ZOCOR) 20 MG tablet Take 20 mg by mouth at bedtime.      . traZODone (DESYREL) 50 MG tablet Take 150 mg by mouth at bedtime.     . Vitamin D, Ergocalciferol, (DRISDOL) 50000 units CAPS capsule Take 50,000 Units by mouth every 7 (seven) days. wednesday    . benzonatate (TESSALON) 100 MG capsule Take 1-2 capsules (100-200 mg total) by mouth 3 (three) times daily as needed for cough. (Patient not taking: Reported on 09/02/2017) 40 capsule 0  . lansoprazole (PREVACID) 15 MG capsule Take 15 mg by mouth daily.     . risperiDONE (RISPERDAL) 0.5 MG tablet Take 1 tablet (0.5 mg total) by mouth 2 (two) times daily. 60  tablet 0    Musculoskeletal: Strength & Muscle Tone: within normal limits Gait & Station: normal Patient leans: N/A  Psychiatric Specialty Exam: Physical Exam  Constitutional: She is oriented to person, place, and time. She appears well-developed and well-nourished.  HENT:  Head: Normocephalic.  Respiratory: Effort  normal.  Musculoskeletal: Normal range of motion.  Neurological: She is alert and oriented to person, place, and time.  Psychiatric: Her behavior is normal. Her mood appears anxious. Her affect is labile. Her speech is rapid and/or pressured. Thought content is delusional. Cognition and memory are normal. She expresses impulsivity.    Review of Systems  Psychiatric/Behavioral: Positive for depression. Negative for hallucinations, memory loss, substance abuse and suicidal ideas. The patient is nervous/anxious. The patient does not have insomnia.   All other systems reviewed and are negative.   Blood pressure (!) 146/83, pulse 90, temperature 98.1 F (36.7 C), temperature source Oral, resp. rate 18, SpO2 98 %.There is no height or weight on file to calculate BMI.  General Appearance: Disheveled  Eye Contact:  Good  Speech:  Clear and Coherent  Volume:  Normal  Mood:  Anxious and Depressed  Affect:  Congruent  Thought Process:  Disorganized  Orientation:  Full (Time, Place, and Person)  Thought Content:  Illogical, Delusions and Tangential  Suicidal Thoughts:  No  Homicidal Thoughts:  No  Memory:  Immediate;   Good Recent;   Good Remote;   Fair  Judgement:  Impaired  Insight:  Lacking  Psychomotor Activity:  Normal  Concentration:  Concentration: Good and Attention Span: Fair  Recall:  [place] of Knowledge:  Good  Language:  Good  Akathisia:  No  Handed:  Right  AIMS (if indicated):     Assets:  Agricultural consultant Housing Social Support  ADL's:  Intact  Cognition:  WNL  Sleep:        Treatment Plan Summary: Daily  contact with patient to assess and evaluate symptoms and progress in treatment and Medication management  Continue these medications:  -Gabapentin 300 mg PO TID for agitation -Ativan 1 mg PO Q8HRs PRN for anxiety -Trazodone 100 mg QHS PRN for sleep   Crisis Stabilization  Disposition: Recommend psychiatric Inpatient admission when medically cleared. Pt has been accepted to [facility] on 12-30-17   Ethelene Hal, NP 12/29/2017 1:14 PM  Patient seen face-to-face for psychiatric evaluation, chart reviewed and case discussed with the physician extender and developed treatment plan. Reviewed the information documented and agree with the treatment plan. Corena Pilgrim, MD

## 2017-12-29 NOTE — ED Notes (Signed)
Patient sleeping during vitals time. Will take vitals when patient wakes up. RN notified.

## 2017-12-29 NOTE — ED Notes (Signed)
Hourly rounding reveals patient in hall. No complaints, stable, in no acute distress. Q15 minute rounds and monitoring via [employer] to continue. 

## 2017-12-30 DIAGNOSIS — M1990 Unspecified osteoarthritis, unspecified site: Secondary | ICD-10-CM | POA: Diagnosis not present

## 2017-12-30 DIAGNOSIS — Z859 Personal history of malignant neoplasm, unspecified: Secondary | ICD-10-CM | POA: Diagnosis not present

## 2017-12-30 DIAGNOSIS — Z79899 Other long term (current) drug therapy: Secondary | ICD-10-CM | POA: Diagnosis not present

## 2017-12-30 DIAGNOSIS — S0990XA Unspecified injury of head, initial encounter: Secondary | ICD-10-CM | POA: Diagnosis not present

## 2017-12-30 DIAGNOSIS — Z880 Allergy status to penicillin: Secondary | ICD-10-CM | POA: Diagnosis not present

## 2017-12-30 DIAGNOSIS — Z046 Encounter for general psychiatric examination, requested by authority: Secondary | ICD-10-CM | POA: Diagnosis not present

## 2017-12-30 DIAGNOSIS — Z885 Allergy status to narcotic agent status: Secondary | ICD-10-CM | POA: Diagnosis not present

## 2017-12-30 DIAGNOSIS — R451 Restlessness and agitation: Secondary | ICD-10-CM | POA: Diagnosis not present

## 2017-12-30 DIAGNOSIS — Z888 Allergy status to other drugs, medicaments and biological substances status: Secondary | ICD-10-CM | POA: Diagnosis not present

## 2017-12-30 DIAGNOSIS — K219 Gastro-esophageal reflux disease without esophagitis: Secondary | ICD-10-CM | POA: Diagnosis present

## 2017-12-30 DIAGNOSIS — F250 Schizoaffective disorder, bipolar type: Secondary | ICD-10-CM | POA: Diagnosis present

## 2017-12-30 DIAGNOSIS — Z7951 Long term (current) use of inhaled steroids: Secondary | ICD-10-CM | POA: Diagnosis not present

## 2017-12-30 DIAGNOSIS — R45851 Suicidal ideations: Secondary | ICD-10-CM | POA: Diagnosis present

## 2017-12-30 DIAGNOSIS — M549 Dorsalgia, unspecified: Secondary | ICD-10-CM | POA: Diagnosis present

## 2017-12-30 DIAGNOSIS — F3110 Bipolar disorder, current episode manic without psychotic features, unspecified: Secondary | ICD-10-CM | POA: Diagnosis not present

## 2017-12-30 DIAGNOSIS — I10 Essential (primary) hypertension: Secondary | ICD-10-CM | POA: Diagnosis present

## 2017-12-30 DIAGNOSIS — Z9114 Patient's other noncompliance with medication regimen: Secondary | ICD-10-CM | POA: Diagnosis not present

## 2017-12-30 DIAGNOSIS — Z853 Personal history of malignant neoplasm of breast: Secondary | ICD-10-CM | POA: Diagnosis not present

## 2017-12-30 DIAGNOSIS — Z87891 Personal history of nicotine dependence: Secondary | ICD-10-CM | POA: Diagnosis not present

## 2017-12-30 DIAGNOSIS — S7002XA Contusion of left hip, initial encounter: Secondary | ICD-10-CM | POA: Diagnosis not present

## 2017-12-30 DIAGNOSIS — F312 Bipolar disorder, current episode manic severe with psychotic features: Secondary | ICD-10-CM | POA: Diagnosis not present

## 2017-12-30 DIAGNOSIS — S73102A Unspecified sprain of left hip, initial encounter: Secondary | ICD-10-CM | POA: Diagnosis not present

## 2017-12-30 DIAGNOSIS — J80 Acute respiratory distress syndrome: Secondary | ICD-10-CM | POA: Diagnosis not present

## 2017-12-30 DIAGNOSIS — M545 Low back pain: Secondary | ICD-10-CM | POA: Diagnosis not present

## 2017-12-30 NOTE — ED Notes (Signed)
Hourly rounding reveals patient sleeping in room. No complaints, stable, in no acute distress. Q15 minute rounds and monitoring via Security Cameras to continue. 

## 2017-12-30 NOTE — ED Notes (Signed)
Hourly rounding reveals patient in hall. No complaints, stable, in no acute distress. Q15 minute rounds and monitoring via [employer] to continue. 

## 2017-12-30 NOTE — ED Notes (Signed)
Pt. Agitated, talking constantly and not redirectable.

## 2017-12-30 NOTE — ED Notes (Signed)
Pt discharged safely with [facility].  Pt was in no distress at discharge.   All belongings were sent with patient.

## 2017-12-30 NOTE — ED Notes (Signed)
Hourly rounding reveals patient in rest room. No complaints, stable, in no acute distress. Q15 minute rounds and monitoring via [employer] Cam[employer] to continue. 

## 2017-12-30 NOTE — ED Notes (Signed)
Hourly rounding reveals patient in hall talking loudly despite redirection attemps. No complaints, stable, in no acute distress. Q15 minute rounds and monitoring via [employer] to continue.

## 2018-01-18 DIAGNOSIS — Z79899 Other long term (current) drug therapy: Secondary | ICD-10-CM | POA: Diagnosis not present

## 2018-01-18 DIAGNOSIS — Z7951 Long term (current) use of inhaled steroids: Secondary | ICD-10-CM | POA: Diagnosis not present

## 2018-01-18 DIAGNOSIS — M545 Low back pain: Secondary | ICD-10-CM | POA: Diagnosis not present

## 2018-01-18 DIAGNOSIS — S7002XA Contusion of left hip, initial encounter: Secondary | ICD-10-CM | POA: Diagnosis not present

## 2018-01-18 DIAGNOSIS — S0990XA Unspecified injury of head, initial encounter: Secondary | ICD-10-CM | POA: Diagnosis not present

## 2018-01-18 DIAGNOSIS — S73102A Unspecified sprain of left hip, initial encounter: Secondary | ICD-10-CM | POA: Diagnosis not present

## 2018-01-22 DIAGNOSIS — F310 Bipolar disorder, current episode hypomanic: Secondary | ICD-10-CM | POA: Diagnosis not present

## 2018-01-22 DIAGNOSIS — F3111 Bipolar disorder, current episode manic without psychotic features, mild: Secondary | ICD-10-CM | POA: Diagnosis not present

## 2018-02-06 DIAGNOSIS — R0781 Pleurodynia: Secondary | ICD-10-CM | POA: Diagnosis not present

## 2018-02-06 DIAGNOSIS — J1100 Influenza due to unidentified influenza virus with unspecified type of pneumonia: Secondary | ICD-10-CM | POA: Diagnosis not present

## 2018-02-06 DIAGNOSIS — J111 Influenza due to unidentified influenza virus with other respiratory manifestations: Secondary | ICD-10-CM | POA: Diagnosis not present

## 2018-02-06 DIAGNOSIS — R05 Cough: Secondary | ICD-10-CM | POA: Diagnosis not present

## 2018-02-12 DIAGNOSIS — F3111 Bipolar disorder, current episode manic without psychotic features, mild: Secondary | ICD-10-CM | POA: Diagnosis not present

## 2018-02-12 DIAGNOSIS — F310 Bipolar disorder, current episode hypomanic: Secondary | ICD-10-CM | POA: Diagnosis not present

## 2018-02-16 DIAGNOSIS — J069 Acute upper respiratory infection, unspecified: Secondary | ICD-10-CM | POA: Diagnosis not present

## 2018-02-16 DIAGNOSIS — M791 Myalgia, unspecified site: Secondary | ICD-10-CM | POA: Diagnosis not present

## 2018-02-21 DIAGNOSIS — Z9119 Patient's noncompliance with other medical treatment and regimen: Secondary | ICD-10-CM | POA: Diagnosis not present

## 2018-02-21 DIAGNOSIS — J1100 Influenza due to unidentified influenza virus with unspecified type of pneumonia: Secondary | ICD-10-CM | POA: Diagnosis not present

## 2018-02-21 DIAGNOSIS — F3130 Bipolar disorder, current episode depressed, mild or moderate severity, unspecified: Secondary | ICD-10-CM | POA: Diagnosis not present

## 2018-03-06 DIAGNOSIS — F310 Bipolar disorder, current episode hypomanic: Secondary | ICD-10-CM | POA: Diagnosis not present

## 2018-03-06 DIAGNOSIS — F3111 Bipolar disorder, current episode manic without psychotic features, mild: Secondary | ICD-10-CM | POA: Diagnosis not present

## 2018-03-21 DIAGNOSIS — H52223 Regular astigmatism, bilateral: Secondary | ICD-10-CM | POA: Diagnosis not present

## 2018-03-21 DIAGNOSIS — H25813 Combined forms of age-related cataract, bilateral: Secondary | ICD-10-CM | POA: Diagnosis not present

## 2018-03-21 DIAGNOSIS — H47323 Drusen of optic disc, bilateral: Secondary | ICD-10-CM | POA: Diagnosis not present

## 2018-03-21 DIAGNOSIS — H5203 Hypermetropia, bilateral: Secondary | ICD-10-CM | POA: Diagnosis not present

## 2018-03-25 DIAGNOSIS — F3111 Bipolar disorder, current episode manic without psychotic features, mild: Secondary | ICD-10-CM | POA: Diagnosis not present

## 2018-03-25 DIAGNOSIS — F310 Bipolar disorder, current episode hypomanic: Secondary | ICD-10-CM | POA: Diagnosis not present

## 2018-03-27 DIAGNOSIS — E559 Vitamin D deficiency, unspecified: Secondary | ICD-10-CM | POA: Diagnosis not present

## 2018-03-27 DIAGNOSIS — R0602 Shortness of breath: Secondary | ICD-10-CM | POA: Diagnosis not present

## 2018-03-27 DIAGNOSIS — R5383 Other fatigue: Secondary | ICD-10-CM | POA: Diagnosis not present

## 2018-03-27 DIAGNOSIS — Z0000 Encounter for general adult medical examination without abnormal findings: Secondary | ICD-10-CM | POA: Diagnosis not present

## 2018-03-27 DIAGNOSIS — E7800 Pure hypercholesterolemia, unspecified: Secondary | ICD-10-CM | POA: Diagnosis not present

## 2018-03-27 DIAGNOSIS — Z13220 Encounter for screening for lipoid disorders: Secondary | ICD-10-CM | POA: Diagnosis not present

## 2018-03-27 DIAGNOSIS — R739 Hyperglycemia, unspecified: Secondary | ICD-10-CM | POA: Diagnosis not present

## 2018-03-27 DIAGNOSIS — Z87891 Personal history of nicotine dependence: Secondary | ICD-10-CM | POA: Diagnosis not present

## 2018-04-09 DIAGNOSIS — F3111 Bipolar disorder, current episode manic without psychotic features, mild: Secondary | ICD-10-CM | POA: Diagnosis not present

## 2018-04-09 DIAGNOSIS — F310 Bipolar disorder, current episode hypomanic: Secondary | ICD-10-CM | POA: Diagnosis not present

## 2018-04-10 DIAGNOSIS — R7303 Prediabetes: Secondary | ICD-10-CM | POA: Diagnosis not present

## 2018-04-10 DIAGNOSIS — E781 Pure hyperglyceridemia: Secondary | ICD-10-CM | POA: Diagnosis not present

## 2018-04-10 DIAGNOSIS — F17200 Nicotine dependence, unspecified, uncomplicated: Secondary | ICD-10-CM | POA: Diagnosis not present

## 2018-04-10 DIAGNOSIS — R3989 Other symptoms and signs involving the genitourinary system: Secondary | ICD-10-CM | POA: Diagnosis not present

## 2018-04-10 DIAGNOSIS — E7800 Pure hypercholesterolemia, unspecified: Secondary | ICD-10-CM | POA: Diagnosis not present

## 2018-04-10 DIAGNOSIS — Z87891 Personal history of nicotine dependence: Secondary | ICD-10-CM | POA: Diagnosis not present

## 2018-04-10 DIAGNOSIS — R319 Hematuria, unspecified: Secondary | ICD-10-CM | POA: Diagnosis not present

## 2018-04-22 DIAGNOSIS — F3111 Bipolar disorder, current episode manic without psychotic features, mild: Secondary | ICD-10-CM | POA: Diagnosis not present

## 2018-04-22 DIAGNOSIS — F310 Bipolar disorder, current episode hypomanic: Secondary | ICD-10-CM | POA: Diagnosis not present

## 2018-05-22 DIAGNOSIS — E7800 Pure hypercholesterolemia, unspecified: Secondary | ICD-10-CM | POA: Diagnosis not present

## 2018-05-22 DIAGNOSIS — K635 Polyp of colon: Secondary | ICD-10-CM | POA: Diagnosis not present

## 2018-05-22 DIAGNOSIS — R7303 Prediabetes: Secondary | ICD-10-CM | POA: Diagnosis not present

## 2018-05-22 DIAGNOSIS — N183 Chronic kidney disease, stage 3 (moderate): Secondary | ICD-10-CM | POA: Diagnosis not present

## 2018-05-29 DIAGNOSIS — F310 Bipolar disorder, current episode hypomanic: Secondary | ICD-10-CM | POA: Diagnosis not present

## 2018-05-29 DIAGNOSIS — F3111 Bipolar disorder, current episode manic without psychotic features, mild: Secondary | ICD-10-CM | POA: Diagnosis not present

## 2018-06-10 DIAGNOSIS — H2513 Age-related nuclear cataract, bilateral: Secondary | ICD-10-CM | POA: Diagnosis not present

## 2018-06-10 DIAGNOSIS — H35371 Puckering of macula, right eye: Secondary | ICD-10-CM | POA: Diagnosis not present

## 2018-06-10 DIAGNOSIS — H2511 Age-related nuclear cataract, right eye: Secondary | ICD-10-CM | POA: Diagnosis not present

## 2018-06-10 DIAGNOSIS — H25013 Cortical age-related cataract, bilateral: Secondary | ICD-10-CM | POA: Diagnosis not present

## 2018-06-10 DIAGNOSIS — H47323 Drusen of optic disc, bilateral: Secondary | ICD-10-CM | POA: Diagnosis not present

## 2018-06-30 DIAGNOSIS — R739 Hyperglycemia, unspecified: Secondary | ICD-10-CM | POA: Diagnosis not present

## 2018-06-30 DIAGNOSIS — E7800 Pure hypercholesterolemia, unspecified: Secondary | ICD-10-CM | POA: Diagnosis not present

## 2018-06-30 DIAGNOSIS — R3989 Other symptoms and signs involving the genitourinary system: Secondary | ICD-10-CM | POA: Diagnosis not present

## 2018-06-30 DIAGNOSIS — R079 Chest pain, unspecified: Secondary | ICD-10-CM | POA: Diagnosis not present

## 2018-06-30 DIAGNOSIS — N183 Chronic kidney disease, stage 3 (moderate): Secondary | ICD-10-CM | POA: Diagnosis not present

## 2018-06-30 DIAGNOSIS — K2990 Gastroduodenitis, unspecified, without bleeding: Secondary | ICD-10-CM | POA: Diagnosis not present

## 2018-07-03 DIAGNOSIS — F310 Bipolar disorder, current episode hypomanic: Secondary | ICD-10-CM | POA: Diagnosis not present

## 2018-07-03 DIAGNOSIS — F3111 Bipolar disorder, current episode manic without psychotic features, mild: Secondary | ICD-10-CM | POA: Diagnosis not present

## 2018-07-03 DIAGNOSIS — F339 Major depressive disorder, recurrent, unspecified: Secondary | ICD-10-CM | POA: Diagnosis not present

## 2018-07-03 DIAGNOSIS — F330 Major depressive disorder, recurrent, mild: Secondary | ICD-10-CM | POA: Diagnosis not present

## 2018-07-04 DIAGNOSIS — N183 Chronic kidney disease, stage 3 (moderate): Secondary | ICD-10-CM | POA: Diagnosis not present

## 2018-07-17 DIAGNOSIS — F3111 Bipolar disorder, current episode manic without psychotic features, mild: Secondary | ICD-10-CM | POA: Diagnosis not present

## 2018-07-17 DIAGNOSIS — F310 Bipolar disorder, current episode hypomanic: Secondary | ICD-10-CM | POA: Diagnosis not present

## 2018-07-21 DIAGNOSIS — Z87891 Personal history of nicotine dependence: Secondary | ICD-10-CM | POA: Diagnosis not present

## 2018-07-31 DIAGNOSIS — Z87891 Personal history of nicotine dependence: Secondary | ICD-10-CM | POA: Diagnosis not present

## 2018-07-31 DIAGNOSIS — R200 Anesthesia of skin: Secondary | ICD-10-CM | POA: Diagnosis not present

## 2018-07-31 DIAGNOSIS — R079 Chest pain, unspecified: Secondary | ICD-10-CM | POA: Diagnosis not present

## 2018-07-31 DIAGNOSIS — E7800 Pure hypercholesterolemia, unspecified: Secondary | ICD-10-CM | POA: Diagnosis not present

## 2018-08-04 DIAGNOSIS — Z961 Presence of intraocular lens: Secondary | ICD-10-CM | POA: Diagnosis not present

## 2018-08-04 DIAGNOSIS — Z9841 Cataract extraction status, right eye: Secondary | ICD-10-CM | POA: Diagnosis not present

## 2018-08-04 DIAGNOSIS — H2511 Age-related nuclear cataract, right eye: Secondary | ICD-10-CM | POA: Diagnosis not present

## 2018-08-05 DIAGNOSIS — H2512 Age-related nuclear cataract, left eye: Secondary | ICD-10-CM | POA: Diagnosis not present

## 2018-08-13 DIAGNOSIS — F3111 Bipolar disorder, current episode manic without psychotic features, mild: Secondary | ICD-10-CM | POA: Diagnosis not present

## 2018-08-13 DIAGNOSIS — F310 Bipolar disorder, current episode hypomanic: Secondary | ICD-10-CM | POA: Diagnosis not present

## 2018-08-18 DIAGNOSIS — Z9849 Cataract extraction status, unspecified eye: Secondary | ICD-10-CM | POA: Diagnosis not present

## 2018-08-18 DIAGNOSIS — H2512 Age-related nuclear cataract, left eye: Secondary | ICD-10-CM | POA: Diagnosis not present

## 2018-08-18 DIAGNOSIS — H2511 Age-related nuclear cataract, right eye: Secondary | ICD-10-CM | POA: Diagnosis not present

## 2018-08-18 DIAGNOSIS — Z961 Presence of intraocular lens: Secondary | ICD-10-CM | POA: Diagnosis not present

## 2018-08-29 DIAGNOSIS — R110 Nausea: Secondary | ICD-10-CM | POA: Diagnosis not present

## 2018-08-29 DIAGNOSIS — M797 Fibromyalgia: Secondary | ICD-10-CM | POA: Diagnosis not present

## 2018-08-29 DIAGNOSIS — E559 Vitamin D deficiency, unspecified: Secondary | ICD-10-CM | POA: Diagnosis not present

## 2018-08-29 DIAGNOSIS — E7800 Pure hypercholesterolemia, unspecified: Secondary | ICD-10-CM | POA: Diagnosis not present

## 2018-09-10 DIAGNOSIS — F3111 Bipolar disorder, current episode manic without psychotic features, mild: Secondary | ICD-10-CM | POA: Diagnosis not present

## 2018-09-10 DIAGNOSIS — F310 Bipolar disorder, current episode hypomanic: Secondary | ICD-10-CM | POA: Diagnosis not present

## 2018-10-22 DIAGNOSIS — F3111 Bipolar disorder, current episode manic without psychotic features, mild: Secondary | ICD-10-CM | POA: Diagnosis not present

## 2018-10-22 DIAGNOSIS — F310 Bipolar disorder, current episode hypomanic: Secondary | ICD-10-CM | POA: Diagnosis not present

## 2018-11-04 DIAGNOSIS — S8991XA Unspecified injury of right lower leg, initial encounter: Secondary | ICD-10-CM | POA: Diagnosis not present

## 2018-11-04 DIAGNOSIS — M25561 Pain in right knee: Secondary | ICD-10-CM | POA: Diagnosis not present

## 2018-11-12 DIAGNOSIS — M25561 Pain in right knee: Secondary | ICD-10-CM | POA: Diagnosis not present

## 2018-11-18 DIAGNOSIS — M25561 Pain in right knee: Secondary | ICD-10-CM | POA: Diagnosis not present

## 2018-12-05 DIAGNOSIS — H52223 Regular astigmatism, bilateral: Secondary | ICD-10-CM | POA: Diagnosis not present

## 2018-12-05 DIAGNOSIS — H5203 Hypermetropia, bilateral: Secondary | ICD-10-CM | POA: Diagnosis not present

## 2018-12-05 DIAGNOSIS — Z961 Presence of intraocular lens: Secondary | ICD-10-CM | POA: Diagnosis not present

## 2018-12-05 DIAGNOSIS — H35371 Puckering of macula, right eye: Secondary | ICD-10-CM | POA: Diagnosis not present

## 2018-12-05 DIAGNOSIS — H43821 Vitreomacular adhesion, right eye: Secondary | ICD-10-CM | POA: Diagnosis not present

## 2018-12-05 DIAGNOSIS — H26493 Other secondary cataract, bilateral: Secondary | ICD-10-CM | POA: Diagnosis not present

## 2018-12-10 DIAGNOSIS — M25561 Pain in right knee: Secondary | ICD-10-CM | POA: Diagnosis not present

## 2018-12-22 DIAGNOSIS — M25561 Pain in right knee: Secondary | ICD-10-CM | POA: Diagnosis not present

## 2019-01-02 DIAGNOSIS — M2241 Chondromalacia patellae, right knee: Secondary | ICD-10-CM | POA: Diagnosis not present

## 2019-01-16 DIAGNOSIS — M25561 Pain in right knee: Secondary | ICD-10-CM | POA: Diagnosis not present

## 2019-01-18 DIAGNOSIS — L03011 Cellulitis of right finger: Secondary | ICD-10-CM | POA: Diagnosis not present

## 2019-01-18 DIAGNOSIS — M79644 Pain in right finger(s): Secondary | ICD-10-CM | POA: Diagnosis not present

## 2019-01-19 DIAGNOSIS — J1100 Influenza due to unidentified influenza virus with unspecified type of pneumonia: Secondary | ICD-10-CM | POA: Diagnosis not present

## 2019-01-19 DIAGNOSIS — F17200 Nicotine dependence, unspecified, uncomplicated: Secondary | ICD-10-CM | POA: Diagnosis not present

## 2019-01-19 DIAGNOSIS — R05 Cough: Secondary | ICD-10-CM | POA: Diagnosis not present

## 2019-01-19 DIAGNOSIS — Z87891 Personal history of nicotine dependence: Secondary | ICD-10-CM | POA: Diagnosis not present

## 2019-01-21 DIAGNOSIS — J1100 Influenza due to unidentified influenza virus with unspecified type of pneumonia: Secondary | ICD-10-CM | POA: Diagnosis not present

## 2019-01-21 DIAGNOSIS — R05 Cough: Secondary | ICD-10-CM | POA: Diagnosis not present

## 2019-01-28 DIAGNOSIS — F310 Bipolar disorder, current episode hypomanic: Secondary | ICD-10-CM | POA: Diagnosis not present

## 2019-01-28 DIAGNOSIS — F3111 Bipolar disorder, current episode manic without psychotic features, mild: Secondary | ICD-10-CM | POA: Diagnosis not present

## 2019-01-28 DIAGNOSIS — J1100 Influenza due to unidentified influenza virus with unspecified type of pneumonia: Secondary | ICD-10-CM | POA: Diagnosis not present

## 2019-02-05 DIAGNOSIS — Z961 Presence of intraocular lens: Secondary | ICD-10-CM | POA: Diagnosis not present

## 2019-02-05 DIAGNOSIS — H26491 Other secondary cataract, right eye: Secondary | ICD-10-CM | POA: Diagnosis not present

## 2019-02-05 DIAGNOSIS — H35371 Puckering of macula, right eye: Secondary | ICD-10-CM | POA: Diagnosis not present

## 2019-02-05 DIAGNOSIS — H47323 Drusen of optic disc, bilateral: Secondary | ICD-10-CM | POA: Diagnosis not present

## 2019-02-19 DIAGNOSIS — S0993XS Unspecified injury of face, sequela: Secondary | ICD-10-CM | POA: Diagnosis not present

## 2019-02-19 DIAGNOSIS — W19XXXS Unspecified fall, sequela: Secondary | ICD-10-CM | POA: Diagnosis not present

## 2019-02-19 DIAGNOSIS — S93401S Sprain of unspecified ligament of right ankle, sequela: Secondary | ICD-10-CM | POA: Diagnosis not present

## 2019-02-19 DIAGNOSIS — M25571 Pain in right ankle and joints of right foot: Secondary | ICD-10-CM | POA: Diagnosis not present

## 2019-02-20 DIAGNOSIS — M25571 Pain in right ankle and joints of right foot: Secondary | ICD-10-CM | POA: Diagnosis not present

## 2019-02-25 DIAGNOSIS — F310 Bipolar disorder, current episode hypomanic: Secondary | ICD-10-CM | POA: Diagnosis not present

## 2019-02-25 DIAGNOSIS — F3111 Bipolar disorder, current episode manic without psychotic features, mild: Secondary | ICD-10-CM | POA: Diagnosis not present

## 2019-03-05 DIAGNOSIS — F310 Bipolar disorder, current episode hypomanic: Secondary | ICD-10-CM | POA: Diagnosis not present

## 2019-03-05 DIAGNOSIS — F3111 Bipolar disorder, current episode manic without psychotic features, mild: Secondary | ICD-10-CM | POA: Diagnosis not present

## 2019-03-25 DIAGNOSIS — F310 Bipolar disorder, current episode hypomanic: Secondary | ICD-10-CM | POA: Diagnosis not present

## 2019-03-25 DIAGNOSIS — S29019A Strain of muscle and tendon of unspecified wall of thorax, initial encounter: Secondary | ICD-10-CM | POA: Diagnosis not present

## 2019-03-25 DIAGNOSIS — M545 Low back pain: Secondary | ICD-10-CM | POA: Diagnosis not present

## 2019-03-25 DIAGNOSIS — S161XXA Strain of muscle, fascia and tendon at neck level, initial encounter: Secondary | ICD-10-CM | POA: Diagnosis not present

## 2019-03-25 DIAGNOSIS — M542 Cervicalgia: Secondary | ICD-10-CM | POA: Diagnosis not present

## 2019-03-25 DIAGNOSIS — F3111 Bipolar disorder, current episode manic without psychotic features, mild: Secondary | ICD-10-CM | POA: Diagnosis not present

## 2019-03-25 DIAGNOSIS — S39012A Strain of muscle, fascia and tendon of lower back, initial encounter: Secondary | ICD-10-CM | POA: Diagnosis not present

## 2019-03-30 ENCOUNTER — Ambulatory Visit
Admission: RE | Admit: 2019-03-30 | Discharge: 2019-03-30 | Disposition: A | Discharge status: Home / Self Care | Payer: Medicare Other | Source: Ambulatory Visit | Attending: Sports Medicine | Admitting: Sports Medicine

## 2019-03-30 ENCOUNTER — Other Ambulatory Visit: Payer: Self-pay

## 2019-03-30 ENCOUNTER — Other Ambulatory Visit: Payer: Self-pay | Admitting: Sports Medicine

## 2019-03-30 DIAGNOSIS — R0789 Other chest pain: Secondary | ICD-10-CM

## 2019-03-30 DIAGNOSIS — R079 Chest pain, unspecified: Secondary | ICD-10-CM | POA: Diagnosis not present

## 2019-04-08 DIAGNOSIS — R0781 Pleurodynia: Secondary | ICD-10-CM | POA: Diagnosis not present

## 2019-04-08 DIAGNOSIS — M545 Low back pain: Secondary | ICD-10-CM | POA: Diagnosis not present

## 2019-04-08 DIAGNOSIS — M542 Cervicalgia: Secondary | ICD-10-CM | POA: Diagnosis not present

## 2019-04-08 DIAGNOSIS — R0789 Other chest pain: Secondary | ICD-10-CM | POA: Diagnosis not present

## 2019-04-20 DIAGNOSIS — R0789 Other chest pain: Secondary | ICD-10-CM | POA: Diagnosis not present

## 2019-04-20 DIAGNOSIS — M542 Cervicalgia: Secondary | ICD-10-CM | POA: Diagnosis not present

## 2019-04-20 DIAGNOSIS — M545 Low back pain: Secondary | ICD-10-CM | POA: Diagnosis not present

## 2019-05-06 DIAGNOSIS — M542 Cervicalgia: Secondary | ICD-10-CM | POA: Diagnosis not present

## 2019-05-19 DIAGNOSIS — E7800 Pure hypercholesterolemia, unspecified: Secondary | ICD-10-CM | POA: Diagnosis not present

## 2019-05-19 DIAGNOSIS — Z79899 Other long term (current) drug therapy: Secondary | ICD-10-CM | POA: Diagnosis not present

## 2019-05-19 DIAGNOSIS — Z1159 Encounter for screening for other viral diseases: Secondary | ICD-10-CM | POA: Diagnosis not present

## 2019-05-19 DIAGNOSIS — R0602 Shortness of breath: Secondary | ICD-10-CM | POA: Diagnosis not present

## 2019-05-20 DIAGNOSIS — S161XXA Strain of muscle, fascia and tendon at neck level, initial encounter: Secondary | ICD-10-CM | POA: Diagnosis not present

## 2019-05-20 DIAGNOSIS — S39012A Strain of muscle, fascia and tendon of lower back, initial encounter: Secondary | ICD-10-CM | POA: Diagnosis not present

## 2019-05-20 DIAGNOSIS — M545 Low back pain: Secondary | ICD-10-CM | POA: Diagnosis not present

## 2019-05-20 DIAGNOSIS — M542 Cervicalgia: Secondary | ICD-10-CM | POA: Diagnosis not present

## 2019-05-20 DIAGNOSIS — M25551 Pain in right hip: Secondary | ICD-10-CM | POA: Diagnosis not present

## 2019-05-25 DIAGNOSIS — M7061 Trochanteric bursitis, right hip: Secondary | ICD-10-CM | POA: Diagnosis not present

## 2019-05-27 DIAGNOSIS — F3111 Bipolar disorder, current episode manic without psychotic features, mild: Secondary | ICD-10-CM | POA: Diagnosis not present

## 2019-05-27 DIAGNOSIS — F310 Bipolar disorder, current episode hypomanic: Secondary | ICD-10-CM | POA: Diagnosis not present

## 2019-06-01 DIAGNOSIS — M542 Cervicalgia: Secondary | ICD-10-CM | POA: Diagnosis not present

## 2019-06-01 DIAGNOSIS — M545 Low back pain: Secondary | ICD-10-CM | POA: Diagnosis not present

## 2019-06-18 DIAGNOSIS — Z79891 Long term (current) use of opiate analgesic: Secondary | ICD-10-CM | POA: Diagnosis not present

## 2019-07-13 DIAGNOSIS — R3121 Asymptomatic microscopic hematuria: Secondary | ICD-10-CM | POA: Diagnosis not present

## 2019-07-13 DIAGNOSIS — R350 Frequency of micturition: Secondary | ICD-10-CM | POA: Diagnosis not present

## 2019-07-13 DIAGNOSIS — N3000 Acute cystitis without hematuria: Secondary | ICD-10-CM | POA: Diagnosis not present

## 2019-07-16 DIAGNOSIS — Z79891 Long term (current) use of opiate analgesic: Secondary | ICD-10-CM | POA: Diagnosis not present

## 2019-07-16 DIAGNOSIS — M7061 Trochanteric bursitis, right hip: Secondary | ICD-10-CM | POA: Diagnosis not present

## 2019-07-16 DIAGNOSIS — G894 Chronic pain syndrome: Secondary | ICD-10-CM | POA: Diagnosis not present

## 2019-07-16 DIAGNOSIS — M545 Low back pain: Secondary | ICD-10-CM | POA: Diagnosis not present

## 2019-08-03 DIAGNOSIS — Z79891 Long term (current) use of opiate analgesic: Secondary | ICD-10-CM | POA: Diagnosis not present

## 2019-08-03 DIAGNOSIS — M25551 Pain in right hip: Secondary | ICD-10-CM | POA: Diagnosis not present

## 2019-08-03 DIAGNOSIS — M545 Low back pain: Secondary | ICD-10-CM | POA: Diagnosis not present

## 2019-08-05 DIAGNOSIS — F310 Bipolar disorder, current episode hypomanic: Secondary | ICD-10-CM | POA: Diagnosis not present

## 2019-08-05 DIAGNOSIS — F3111 Bipolar disorder, current episode manic without psychotic features, mild: Secondary | ICD-10-CM | POA: Diagnosis not present

## 2019-08-11 DIAGNOSIS — M25551 Pain in right hip: Secondary | ICD-10-CM | POA: Diagnosis not present

## 2019-08-14 DIAGNOSIS — M25562 Pain in left knee: Secondary | ICD-10-CM | POA: Diagnosis not present

## 2019-08-19 DIAGNOSIS — Z1159 Encounter for screening for other viral diseases: Secondary | ICD-10-CM | POA: Diagnosis not present

## 2019-08-19 DIAGNOSIS — F17210 Nicotine dependence, cigarettes, uncomplicated: Secondary | ICD-10-CM | POA: Diagnosis not present

## 2019-08-19 DIAGNOSIS — E7800 Pure hypercholesterolemia, unspecified: Secondary | ICD-10-CM | POA: Diagnosis not present

## 2019-08-19 DIAGNOSIS — R5383 Other fatigue: Secondary | ICD-10-CM | POA: Diagnosis not present

## 2019-08-20 DIAGNOSIS — M25562 Pain in left knee: Secondary | ICD-10-CM | POA: Diagnosis not present

## 2019-08-24 DIAGNOSIS — M25551 Pain in right hip: Secondary | ICD-10-CM | POA: Diagnosis not present

## 2019-09-01 DIAGNOSIS — M25551 Pain in right hip: Secondary | ICD-10-CM | POA: Diagnosis not present

## 2019-09-02 DIAGNOSIS — M25462 Effusion, left knee: Secondary | ICD-10-CM | POA: Diagnosis not present

## 2019-09-02 DIAGNOSIS — M25562 Pain in left knee: Secondary | ICD-10-CM | POA: Diagnosis not present

## 2019-09-09 DIAGNOSIS — M25462 Effusion, left knee: Secondary | ICD-10-CM | POA: Diagnosis not present

## 2019-09-11 DIAGNOSIS — M25562 Pain in left knee: Secondary | ICD-10-CM | POA: Diagnosis not present

## 2019-09-11 DIAGNOSIS — M25462 Effusion, left knee: Secondary | ICD-10-CM | POA: Diagnosis not present

## 2019-09-14 DIAGNOSIS — S83242A Other tear of medial meniscus, current injury, left knee, initial encounter: Secondary | ICD-10-CM | POA: Diagnosis not present

## 2019-09-23 DIAGNOSIS — M23262 Derangement of other lateral meniscus due to old tear or injury, left knee: Secondary | ICD-10-CM | POA: Diagnosis not present

## 2019-09-23 DIAGNOSIS — M23222 Derangement of posterior horn of medial meniscus due to old tear or injury, left knee: Secondary | ICD-10-CM | POA: Diagnosis not present

## 2019-09-23 DIAGNOSIS — M94262 Chondromalacia, left knee: Secondary | ICD-10-CM | POA: Diagnosis not present

## 2019-10-05 DIAGNOSIS — F3111 Bipolar disorder, current episode manic without psychotic features, mild: Secondary | ICD-10-CM | POA: Diagnosis not present

## 2019-10-05 DIAGNOSIS — F310 Bipolar disorder, current episode hypomanic: Secondary | ICD-10-CM | POA: Diagnosis not present

## 2019-10-08 DIAGNOSIS — M6281 Muscle weakness (generalized): Secondary | ICD-10-CM | POA: Diagnosis not present

## 2019-10-15 DIAGNOSIS — M545 Low back pain: Secondary | ICD-10-CM | POA: Diagnosis not present

## 2019-10-15 DIAGNOSIS — Z79899 Other long term (current) drug therapy: Secondary | ICD-10-CM | POA: Diagnosis not present

## 2019-10-15 DIAGNOSIS — M542 Cervicalgia: Secondary | ICD-10-CM | POA: Diagnosis not present

## 2019-10-15 DIAGNOSIS — G894 Chronic pain syndrome: Secondary | ICD-10-CM | POA: Diagnosis not present

## 2019-10-20 DIAGNOSIS — M6281 Muscle weakness (generalized): Secondary | ICD-10-CM | POA: Diagnosis not present

## 2019-10-22 DIAGNOSIS — M6281 Muscle weakness (generalized): Secondary | ICD-10-CM | POA: Diagnosis not present

## 2019-10-27 DIAGNOSIS — M6281 Muscle weakness (generalized): Secondary | ICD-10-CM | POA: Diagnosis not present

## 2019-10-29 DIAGNOSIS — M6281 Muscle weakness (generalized): Secondary | ICD-10-CM | POA: Diagnosis not present

## 2019-11-02 DIAGNOSIS — F310 Bipolar disorder, current episode hypomanic: Secondary | ICD-10-CM | POA: Diagnosis not present

## 2019-11-02 DIAGNOSIS — F3111 Bipolar disorder, current episode manic without psychotic features, mild: Secondary | ICD-10-CM | POA: Diagnosis not present

## 2019-11-03 DIAGNOSIS — M6281 Muscle weakness (generalized): Secondary | ICD-10-CM | POA: Diagnosis not present

## 2019-11-11 DIAGNOSIS — M6281 Muscle weakness (generalized): Secondary | ICD-10-CM | POA: Diagnosis not present

## 2019-11-24 DIAGNOSIS — M6281 Muscle weakness (generalized): Secondary | ICD-10-CM | POA: Diagnosis not present

## 2019-11-26 DIAGNOSIS — M1712 Unilateral primary osteoarthritis, left knee: Secondary | ICD-10-CM | POA: Diagnosis not present

## 2020-01-20 ENCOUNTER — Ambulatory Visit: Payer: Medicare Other

## 2020-01-29 ENCOUNTER — Ambulatory Visit: Payer: Medicare Other | Attending: Internal Medicine

## 2020-01-29 DIAGNOSIS — Z23 Encounter for immunization: Secondary | ICD-10-CM

## 2020-01-29 NOTE — Progress Notes (Signed)
   Covid-19 Vaccination Clinic  Name:  Diannie Minix    MRN: [id number] DOB: Nov 16, 1946  01/29/2020  Ms. Winstead was observed post Covid-19 immunization for 15 minutes without incidence. She was provided with Vaccine Information Sheet and instruction to access the V-Safe system.   Ms. Stellar was instructed to call 911 with any severe reactions post vaccine: Marland Kitchen Difficulty breathing  . Swelling of your face and throat  . A fast heartbeat  . A bad rash all over your body  . Dizziness and weakness    Immunizations Administered    Name Date Dose VIS Date Route   Pfizer COVID-19 Vaccine 01/29/2020 10:42 AM 0.3 mL 12/04/2019 Intramuscular   Manufacturer: [place]   Lot: [id number]   [place]: [id number]

## 2020-02-06 ENCOUNTER — Ambulatory Visit: Payer: Medicare Other

## 2020-02-23 ENCOUNTER — Ambulatory Visit: Payer: Medicare Other

## 2020-03-18 ENCOUNTER — Ambulatory Visit: Payer: Medicare Other

## 2020-05-18 ENCOUNTER — Inpatient Hospital Stay (HOSPITAL_COMMUNITY)
Admission: EM | Admit: 2020-05-18 | Discharge: 2020-05-27 | DRG: 535 | Disposition: A | Discharge status: Skilled Nursing Facility | Payer: Medicare Other | Attending: Internal Medicine | Admitting: Internal Medicine

## 2020-05-18 ENCOUNTER — Emergency Department (HOSPITAL_COMMUNITY): Payer: Medicare Other

## 2020-05-18 ENCOUNTER — Other Ambulatory Visit: Payer: Self-pay

## 2020-05-18 ENCOUNTER — Encounter (HOSPITAL_COMMUNITY): Payer: Self-pay | Admitting: Emergency Medicine

## 2020-05-18 DIAGNOSIS — D62 Acute posthemorrhagic anemia: Secondary | ICD-10-CM | POA: Diagnosis not present

## 2020-05-18 DIAGNOSIS — E785 Hyperlipidemia, unspecified: Secondary | ICD-10-CM | POA: Diagnosis present

## 2020-05-18 DIAGNOSIS — S32512A Fracture of superior rim of left pubis, initial encounter for closed fracture: Secondary | ICD-10-CM | POA: Diagnosis not present

## 2020-05-18 DIAGNOSIS — Z79899 Other long term (current) drug therapy: Secondary | ICD-10-CM

## 2020-05-18 DIAGNOSIS — Z87891 Personal history of nicotine dependence: Secondary | ICD-10-CM

## 2020-05-18 DIAGNOSIS — F3110 Bipolar disorder, current episode manic without psychotic features, unspecified: Secondary | ICD-10-CM | POA: Diagnosis present

## 2020-05-18 DIAGNOSIS — W19XXXA Unspecified fall, initial encounter: Secondary | ICD-10-CM | POA: Diagnosis not present

## 2020-05-18 DIAGNOSIS — Y92009 Unspecified place in unspecified non-institutional (private) residence as the place of occurrence of the external cause: Secondary | ICD-10-CM

## 2020-05-18 DIAGNOSIS — M545 Low back pain, unspecified: Secondary | ICD-10-CM

## 2020-05-18 DIAGNOSIS — W010XXA Fall on same level from slipping, tripping and stumbling without subsequent striking against object, initial encounter: Secondary | ICD-10-CM | POA: Diagnosis present

## 2020-05-18 DIAGNOSIS — Z888 Allergy status to other drugs, medicaments and biological substances status: Secondary | ICD-10-CM

## 2020-05-18 DIAGNOSIS — R443 Hallucinations, unspecified: Secondary | ICD-10-CM | POA: Diagnosis present

## 2020-05-18 DIAGNOSIS — S32592A Other specified fracture of left pubis, initial encounter for closed fracture: Secondary | ICD-10-CM | POA: Diagnosis present

## 2020-05-18 DIAGNOSIS — F319 Bipolar disorder, unspecified: Secondary | ICD-10-CM | POA: Diagnosis present

## 2020-05-18 DIAGNOSIS — Z882 Allergy status to sulfonamides status: Secondary | ICD-10-CM

## 2020-05-18 DIAGNOSIS — M25552 Pain in left hip: Secondary | ICD-10-CM | POA: Diagnosis present

## 2020-05-18 DIAGNOSIS — Z881 Allergy status to other antibiotic agents status: Secondary | ICD-10-CM

## 2020-05-18 DIAGNOSIS — R300 Dysuria: Secondary | ICD-10-CM | POA: Diagnosis present

## 2020-05-18 DIAGNOSIS — M25562 Pain in left knee: Secondary | ICD-10-CM | POA: Diagnosis present

## 2020-05-18 DIAGNOSIS — K219 Gastro-esophageal reflux disease without esophagitis: Secondary | ICD-10-CM | POA: Diagnosis present

## 2020-05-18 DIAGNOSIS — F341 Dysthymic disorder: Secondary | ICD-10-CM | POA: Diagnosis present

## 2020-05-18 DIAGNOSIS — F419 Anxiety disorder, unspecified: Secondary | ICD-10-CM | POA: Diagnosis present

## 2020-05-18 DIAGNOSIS — G9341 Metabolic encephalopathy: Secondary | ICD-10-CM | POA: Diagnosis present

## 2020-05-18 DIAGNOSIS — Z20822 Contact with and (suspected) exposure to covid-19: Secondary | ICD-10-CM | POA: Diagnosis present

## 2020-05-18 DIAGNOSIS — M797 Fibromyalgia: Secondary | ICD-10-CM | POA: Diagnosis present

## 2020-05-18 DIAGNOSIS — Z821 Family history of blindness and visual loss: Secondary | ICD-10-CM

## 2020-05-18 DIAGNOSIS — K2970 Gastritis, unspecified, without bleeding: Secondary | ICD-10-CM | POA: Diagnosis present

## 2020-05-18 DIAGNOSIS — F430 Acute stress reaction: Secondary | ICD-10-CM | POA: Diagnosis present

## 2020-05-18 DIAGNOSIS — Z8249 Family history of ischemic heart disease and other diseases of the circulatory system: Secondary | ICD-10-CM

## 2020-05-18 HISTORY — DX: Other specified fracture of left pubis, initial encounter for closed fracture: S32.592A

## 2020-05-18 LAB — CBC WITH DIFFERENTIAL/PLATELET
Abs Immature Granulocytes: 0.06 10*3/uL (ref 0.00–0.07)
Basophils Absolute: 0 10*3/uL (ref 0.0–0.1)
Basophils Relative: 0 %
Eosinophils Absolute: 0 10*3/uL (ref 0.0–0.5)
Eosinophils Relative: 0 %
HCT: 41.4 % (ref 36.0–46.0)
Hemoglobin: 13.8 g/dL (ref 12.0–15.0)
Immature Granulocytes: 1 %
Lymphocytes Relative: 19 %
Lymphs Abs: 1.4 10*3/uL (ref 0.7–4.0)
MCH: 32 pg (ref 26.0–34.0)
MCHC: 33.3 g/dL (ref 30.0–36.0)
MCV: 96.1 fL (ref 80.0–100.0)
Monocytes Absolute: 0.7 10*3/uL (ref 0.1–1.0)
Monocytes Relative: 10 %
Neutro Abs: 5.2 10*3/uL (ref 1.7–7.7)
Neutrophils Relative %: 70 %
Platelets: 185 10*3/uL (ref 150–400)
RBC: 4.31 MIL/uL (ref 3.87–5.11)
RDW: 12.2 % (ref 11.5–15.5)
WBC: 7.4 10*3/uL (ref 4.0–10.5)
nRBC: 0 % (ref 0.0–0.2)

## 2020-05-18 LAB — COMPREHENSIVE METABOLIC PANEL
ALT: 14 U/L (ref 0–44)
AST: 16 U/L (ref 15–41)
Albumin: 3.7 g/dL (ref 3.5–5.0)
Alkaline Phosphatase: 39 U/L (ref 38–126)
Anion gap: 9 (ref 5–15)
BUN: 15 mg/dL (ref 8–23)
CO2: 29 mmol/L (ref 22–32)
Calcium: 8.9 mg/dL (ref 8.9–10.3)
Chloride: 100 mmol/L (ref 98–111)
Creatinine, Ser: 0.73 mg/dL (ref 0.44–1.00)
GFR calc Af Amer: >60 mL/min (ref >60)
GFR calc non Af Amer: >60 mL/min (ref >60)
Glucose, Bld: 109 mg/dL — ABNORMAL HIGH (ref 70–99)
Potassium: 4.6 mmol/L (ref 3.5–5.1)
Sodium: 138 mmol/L (ref 135–145)
Total Bilirubin: 0.4 mg/dL (ref 0.3–1.2)
Total Protein: 6.5 g/dL (ref 6.5–8.1)

## 2020-05-18 LAB — TYPE AND SCREEN
ABO/RH(D): A POS
Antibody Screen: NEGATIVE

## 2020-05-18 LAB — SARS CORONAVIRUS 2 BY RT PCR (HOSPITAL ORDER, PERFORMED IN ~~LOC~~ HOSPITAL LAB): SARS Coronavirus 2: NEGATIVE

## 2020-05-18 LAB — PROTIME-INR
INR: 1 (ref 0.8–1.2)
Prothrombin Time: 12.7 seconds (ref 11.4–15.2)

## 2020-05-18 MED ORDER — SODIUM CHLORIDE 0.9 % IV SOLN: INTRAVENOUS | Status: DC

## 2020-05-18 MED ORDER — SORBITOL 70 % SOLN
30.0000 mL | Freq: Every day | Status: DC | PRN
  Filled 2020-05-18: qty 30

## 2020-05-18 MED ORDER — PANTOPRAZOLE SODIUM 40 MG PO TBEC
40.0000 mg | DELAYED_RELEASE_TABLET | Freq: Every day | ORAL | Status: DC
  Administered 2020-05-18 – 2020-05-27 (×10): 40 mg via ORAL
  Filled 2020-05-18 (×10): qty 1

## 2020-05-18 MED ORDER — ONDANSETRON HCL 4 MG PO TABS
4.0000 mg | ORAL_TABLET | Freq: Four times a day (QID) | ORAL | Status: DC | PRN
  Administered 2020-05-25 (×2): 4 mg via ORAL
  Filled 2020-05-18 (×2): qty 1

## 2020-05-18 MED ORDER — BENZTROPINE MESYLATE 0.5 MG PO TABS
0.5000 mg | ORAL_TABLET | Freq: Every day | ORAL | Status: DC
  Administered 2020-05-18 – 2020-05-21 (×4): 0.5 mg via ORAL
  Filled 2020-05-18 (×4): qty 1

## 2020-05-18 MED ORDER — BUPROPION HCL ER (XL) 300 MG PO TB24
450.0000 mg | ORAL_TABLET | Freq: Every morning | ORAL | Status: DC
  Administered 2020-05-18 – 2020-05-22 (×5): 450 mg via ORAL
  Filled 2020-05-18 (×4): qty 1
  Filled 2020-05-18: qty 3
  Filled 2020-05-18: qty 1

## 2020-05-18 MED ORDER — ALBUTEROL SULFATE HFA 108 (90 BASE) MCG/ACT IN AERS: 2.0000 | INHALATION_SPRAY | RESPIRATORY_TRACT | Status: DC | PRN

## 2020-05-18 MED ORDER — MIRTAZAPINE 15 MG PO TABS
45.0000 mg | ORAL_TABLET | Freq: Every day | ORAL | Status: DC
  Administered 2020-05-18 – 2020-05-26 (×9): 45 mg via ORAL
  Filled 2020-05-18 (×9): qty 3

## 2020-05-18 MED ORDER — LORAZEPAM 0.5 MG PO TABS
0.5000 mg | ORAL_TABLET | Freq: Two times a day (BID) | ORAL | Status: DC
  Administered 2020-05-18 – 2020-05-27 (×19): 0.5 mg via ORAL
  Filled 2020-05-18 (×19): qty 1

## 2020-05-18 MED ORDER — SENNA 8.6 MG PO TABS
1.0000 | ORAL_TABLET | Freq: Two times a day (BID) | ORAL | Status: DC
  Administered 2020-05-18 – 2020-05-25 (×13): 8.6 mg via ORAL
  Filled 2020-05-18 (×18): qty 1

## 2020-05-18 MED ORDER — QUETIAPINE FUMARATE 50 MG PO TABS
100.0000 mg | ORAL_TABLET | Freq: Every morning | ORAL | Status: DC
  Administered 2020-05-19 – 2020-05-20 (×2): 100 mg via ORAL
  Filled 2020-05-18 (×2): qty 2

## 2020-05-18 MED ORDER — SODIUM CHLORIDE 0.9% FLUSH
3.0000 mL | Freq: Two times a day (BID) | INTRAVENOUS | Status: DC
  Administered 2020-05-18 – 2020-05-26 (×6): 3 mL via INTRAVENOUS

## 2020-05-18 MED ORDER — DIPHENHYDRAMINE HCL 50 MG/ML IJ SOLN
12.5000 mg | Freq: Once | INTRAMUSCULAR | Status: AC
  Administered 2020-05-18: 12.5 mg via INTRAVENOUS
  Filled 2020-05-18: qty 1

## 2020-05-18 MED ORDER — FLUTICASONE PROPIONATE 50 MCG/ACT NA SUSP
2.0000 | Freq: Every day | NASAL | Status: DC
  Administered 2020-05-18 – 2020-05-26 (×7): 2 via NASAL
  Filled 2020-05-18: qty 16

## 2020-05-18 MED ORDER — SIMVASTATIN 20 MG PO TABS
20.0000 mg | ORAL_TABLET | Freq: Every day | ORAL | Status: DC
  Administered 2020-05-18 – 2020-05-26 (×9): 20 mg via ORAL
  Filled 2020-05-18 (×9): qty 1

## 2020-05-18 MED ORDER — DIPHENHYDRAMINE HCL 50 MG/ML IJ SOLN
12.5000 mg | Freq: Four times a day (QID) | INTRAMUSCULAR | Status: DC | PRN
  Administered 2020-05-18 – 2020-05-19 (×3): 12.5 mg via INTRAVENOUS
  Filled 2020-05-18 (×2): qty 1

## 2020-05-18 MED ORDER — DIVALPROEX SODIUM ER 500 MG PO TB24
500.0000 mg | ORAL_TABLET | Freq: Every day | ORAL | Status: DC
  Administered 2020-05-18 – 2020-05-27 (×10): 500 mg via ORAL
  Filled 2020-05-18 (×10): qty 1

## 2020-05-18 MED ORDER — ESCITALOPRAM OXALATE 20 MG PO TABS
20.0000 mg | ORAL_TABLET | Freq: Every day | ORAL | Status: DC
  Administered 2020-05-18 – 2020-05-27 (×10): 20 mg via ORAL
  Filled 2020-05-18: qty 2
  Filled 2020-05-18 (×9): qty 1

## 2020-05-18 MED ORDER — RISPERIDONE 0.25 MG PO TABS
0.5000 mg | ORAL_TABLET | Freq: Two times a day (BID) | ORAL | Status: DC
  Administered 2020-05-18 – 2020-05-20 (×4): 0.5 mg via ORAL
  Filled 2020-05-18 (×4): qty 2
  Filled 2020-05-18: qty 1

## 2020-05-18 MED ORDER — ONDANSETRON HCL 4 MG PO TABS: 8.0000 mg | ORAL_TABLET | Freq: Three times a day (TID) | ORAL | Status: DC | PRN

## 2020-05-18 MED ORDER — ACETAMINOPHEN 325 MG PO TABS
650.0000 mg | ORAL_TABLET | Freq: Four times a day (QID) | ORAL | Status: DC | PRN
  Administered 2020-05-20 – 2020-05-25 (×11): 650 mg via ORAL
  Filled 2020-05-18 (×12): qty 2

## 2020-05-18 MED ORDER — HYDROMORPHONE HCL 2 MG/ML IJ SOLN
2.0000 mg | INTRAMUSCULAR | Status: DC | PRN
  Administered 2020-05-18 (×2): 2 mg via INTRAVENOUS
  Filled 2020-05-18 (×2): qty 1

## 2020-05-18 MED ORDER — HYDROCODONE-ACETAMINOPHEN 5-325 MG PO TABS
1.0000 | ORAL_TABLET | ORAL | Status: DC | PRN
  Administered 2020-05-18: 2 via ORAL
  Administered 2020-05-18: 1 via ORAL
  Administered 2020-05-18 – 2020-05-19 (×3): 2 via ORAL
  Filled 2020-05-18: qty 1
  Filled 2020-05-18 (×4): qty 2

## 2020-05-18 MED ORDER — FENTANYL CITRATE (PF) 100 MCG/2ML IJ SOLN
50.0000 ug | INTRAMUSCULAR | Status: AC | PRN
  Administered 2020-05-18 (×2): 50 ug via INTRAVENOUS
  Filled 2020-05-18 (×2): qty 2

## 2020-05-18 MED ORDER — TRAMADOL HCL 50 MG PO TABS
50.0000 mg | ORAL_TABLET | Freq: Four times a day (QID) | ORAL | Status: DC | PRN
  Administered 2020-05-18 (×2): 50 mg via ORAL
  Filled 2020-05-18 (×2): qty 1

## 2020-05-18 MED ORDER — ACETAMINOPHEN 650 MG RE SUPP: 650.0000 mg | Freq: Four times a day (QID) | RECTAL | Status: DC | PRN

## 2020-05-18 MED ORDER — ONDANSETRON HCL 4 MG/2ML IJ SOLN
4.0000 mg | Freq: Once | INTRAMUSCULAR | Status: AC
  Administered 2020-05-18: 4 mg via INTRAVENOUS
  Filled 2020-05-18: qty 2

## 2020-05-18 MED ORDER — MAGNESIUM HYDROXIDE 400 MG/5ML PO SUSP
30.0000 mL | Freq: Every day | ORAL | Status: DC | PRN
  Administered 2020-05-19 – 2020-05-24 (×2): 30 mL via ORAL
  Filled 2020-05-18 (×2): qty 30

## 2020-05-18 MED ORDER — ONDANSETRON HCL 4 MG/2ML IJ SOLN
4.0000 mg | Freq: Four times a day (QID) | INTRAMUSCULAR | Status: DC | PRN
  Filled 2020-05-18: qty 2

## 2020-05-18 NOTE — ED Triage Notes (Signed)
Arrives via EMS from home, C/C sharp shooting vaginal pain when she moves her L leg, had a mechanical fall around 1.5 hours ago. Denies blood thinners and LOC. Suspected L hip injury, EMS gave 150 mcg Fentanyl, pelvic binder placed by EMS.

## 2020-05-18 NOTE — H&P (Signed)
[facility] History and Physical  [name] [id number] DOB: 1946-01-07 DOA: 05/18/2020 PCP: System, Pcp Not In  Admitted from: Home Chief Complaint: Fall  History of Present Illness: [name] is a 74 y.o. female with PMH significant for bipolar disorder, hyperlipidemia, GERD, fibromyalgia. Patient was brought to the ED from home by EMS with complaint of mechanical fall followed by sharp shooting left groin pain.  Around 7 AM this morning, patient slipped on her flip-flop and fell impacting her left hip and started having left groin pain. She was not able to get off of the floor, managed to crawl to a phone and called EMS.   EMS gave 150 mcg of fentanyl, placed a pelvic binder and brought to the ED. No LOC, no head injury, neck injury or trunk injury.  In the ED, she complained of 10/10 pain with movement and 1/10 pain while laying still. No fever, heart rate in 90s, blood pressure 141/96, breathing room air. Labs unremarkable. Covid PCR negative. EKG with QTc interval 403 ms Chest x-ray clear. X-ray of the hip showed fracture left superior pubic ramus with alignment near anatomic. No other fracture demonstrable. No dislocation.  Orthopedic consultation was called from ED.  Hospitalist service called for further evaluation and management.  At the time of my evaluation, patient was lying on bed.  Minimal pain while at rest but on any movement, severe 10/10 pain in the left hip area. Pending orthopedic eval.  Review of Systems:  All systems were reviewed and were negative unless otherwise mentioned in the HPI   Past medical history: Past Medical History:  Diagnosis Date  . Allergy   . Anemia   . Anxiety   . Arthritis   . Astigmatism   . Bursitis   . Bursitis of hip   . Cancer ([place]) 10/2006   Invasive ductal carcinoma of the right breast   . Carpal tunnel syndrome   . Cataracts, bilateral   . Colitis   . Depression   . 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  Endometriosis   . Fibromyalgia   . Gall stones   . GERD (gastroesophageal reflux disease)   . Hyperlipidemia   . Pneumonia   . Tendon tear     Past surgical history: Past Surgical History:  Procedure Laterality Date  . APPENDECTOMY    . BREAST SURGERY Right 12/11/2006   Lumpectomy  . CARPAL TUNNEL RELEASE    . CYST EXCISION    . ELBOW SURGERY    . LUMBAR FUSION      Social History:  reports that she has quit smoking. She has never used smokeless tobacco. She reports that she does not drink alcohol or use drugs.  Allergies:  Allergies  Allergen Reactions  . Augmentin [Amoxicillin-Pot Clavulanate]   . Doxycycline   . Sulfa Drugs Cross Reactors   . Fentanyl And Related     Other reaction(s): Confusion    Family history:  Family History  Problem Relation Age of Onset  . Cancer Mother   . Heart disease Mother   . Cancer Father        Colon cancer  . Heart disease Father   . Transient ischemic attack Father   . Heart disease Brother   . Heart disease Brother   . Vision loss Sister      Home Meds: Prior to Admission medications   Medication Sig Start Date End Date Taking? Authorizing Provider  albuterol (PROAIR HFA) 108 (90 Base) MCG/ACT inhaler  Inhale 2 puffs into the lungs every 4 (four) hours as needed for wheezing or shortness of breath. 03/09/17   Scot Jun, FNP  benzonatate (TESSALON) 100 MG capsule Take 1-2 capsules (100-200 mg total) by mouth 3 (three) times daily as needed for cough. Patient not taking: Reported on 09/02/2017 05/15/17   Tenna Delaine D, PA-C  buPROPion (WELLBUTRIN XL) 300 MG 24 hr tablet Take 300 mg by mouth daily.      [provider]  cetirizine (ZYRTEC) 10 MG tablet Take 10 mg by mouth daily.      [provider]  escitalopram (LEXAPRO) 20 MG tablet Take 20 mg by mouth daily.     [provider]  fluticasone (FLONASE) 50 MCG/ACT nasal spray Place 2 sprays into both nostrils daily.  10/28/13   [provider]  lansoprazole (PREVACID) 15 MG capsule Take 15 mg by mouth daily.     [provider]  LORazepam (ATIVAN) 0.5 MG tablet Take 1 mg by mouth at bedtime.  10/30/13   [provider]  ondansetron (ZOFRAN) 8 MG tablet Take 8 mg by mouth every 8 (eight) hours as needed.  09/04/13   [provider]  phenazopyridine (PYRIDIUM) 97 MG tablet Take 97 mg by mouth at bedtime.    [provider]  risperiDONE (RISPERDAL) 0.5 MG tablet Take 1 tablet (0.5 mg total) by mouth 2 (two) times daily. 09/02/17   Rutherford Guys, MD  simvastatin (ZOCOR) 20 MG tablet Take 20 mg by mouth at bedtime.      [provider]  traZODone (DESYREL) 50 MG tablet Take 150 mg by mouth at bedtime.  10/30/13   [provider]  Vitamin D, Ergocalciferol, (DRISDOL) 50000 units CAPS capsule Take 50,000 Units by mouth every 7 (seven) days. wednesday    [provider]    Physical Exam: Vitals:   05/18/20 1009 05/18/20 1030 05/18/20 1130 05/18/20 1200  BP: (!) 136/93 (!) 113/95 (!) 144/90 125/81  Pulse: 81 87 97 94  Resp: 12 14 12 14   Temp:      TempSrc:      SpO2: 95% 94% 96% 95%  Weight:      Height:       Wt Readings from Last 3 Encounters:  05/18/20 70 kg  09/02/17 69.6 kg  07/11/17 70.1 kg   Body mass index is 25.68 kg/m.  General exam: Appears calm and comfortable.  Skin: No rashes, lesions or ulcers. HEENT: Atraumatic, normocephalic, supple neck, no obvious bleeding Lungs: Clear to auscultation bilaterally CVS: Regular rate and rhythm, no murmur GI/Abd soft, nontender, nondistended, bowel sound present CNS: Alert, awake, oriented x3 Psychiatry: Mood appropriate Extremities: No pedal edema, no calf tenderness, tenderness present in left groin.     Consult Orders  (From admission, onward)         Start     Ordered   05/18/20 1038  Consult to hospitalist  ALL PATIENTS BEING ADMITTED/HAVING PROCEDURES NEED COVID-19 SCREENING  Once      Comments: ALL PATIENTS BEING ADMITTED/HAVING PROCEDURES NEED COVID-19 SCREENING  Provider:  (Not yet assigned)  Question Answer Comment  Place call to: [facility]   Reason for Consult Admit      05/18/20 1037          Labs on Admission:   CBC: Recent Labs  Lab 05/18/20 0914  WBC 7.4  NEUTROABS 5.2  HGB 13.8  HCT 41.4  MCV 96.1  PLT 185  Basic Metabolic Panel: Recent Labs  Lab 05/18/20 0914  NA 138  K 4.6  CL 100  CO2 29  GLUCOSE 109*  BUN 15  CREATININE 0.73  CALCIUM 8.9    Liver Function Tests: Recent Labs  Lab 05/18/20 0914  AST 16  ALT 14  ALKPHOS 39  BILITOT 0.4  PROT 6.5  ALBUMIN 3.7   No results for input(s): LIPASE, AMYLASE in the last 168 hours. No results for input(s): AMMONIA in the last 168 hours.  Cardiac Enzymes: No results for input(s): CKTOTAL, CKMB, CKMBINDEX, TROPONINI in the last 168 hours.  BNP (last 3 results) No results for input(s): BNP in the last 8760 hours.  ProBNP (last 3 results) No results for input(s): PROBNP in the last 8760 hours.  CBG: No results for input(s): GLUCAP in the last 168 hours.  Lipase     Component Value Date/Time   LIPASE 14 11/17/2007 1643     Urinalysis    Component Value Date/Time   COLORURINE AMBER (A) 12/27/2017 2012   APPEARANCEUR CLEAR 12/27/2017 2012   LABSPEC 1.006 12/27/2017 2012   PHURINE 6.0 12/27/2017 2012   GLUCOSEU NEGATIVE 12/27/2017 2012   HGBUR NEGATIVE 12/27/2017 2012   BILIRUBINUR NEGATIVE 12/27/2017 2012   BILIRUBINUR moderate (A) 12/17/2015 0821   BILIRUBINUR neg 07/23/2013 [place] 12/27/2017 2012   PROTEINUR NEGATIVE 12/27/2017 2012   UROBILINOGEN >=8.0 12/17/2015 0821   UROBILINOGEN 1.0 12/14/2007 1554   NITRITE POSITIVE (A) 12/27/2017 2012   LEUKOCYTESUR TRACE (A) 12/27/2017 2012     Drugs of Abuse     Component Value Date/Time   LABOPIA NONE DETECTED 12/27/2017 2012   COCAINSCRNUR NONE DETECTED 12/27/2017 2012    COCAINSCRNUR NEGATIVE 11/17/2007 1553   LABBENZ NONE DETECTED 12/27/2017 2012   LABBENZ POSITIVE Sent for confirmatory testing (A) 11/17/2007 1553   AMPHETMU NONE DETECTED 12/27/2017 2012   THCU NONE DETECTED 12/27/2017 2012   [place] DETECTED 12/27/2017 2012      Radiological Exams on Admission: DG Chest Port 1 View  Result Date: 05/18/2020 CLINICAL DATA:  Pain following fall EXAM: PORTABLE CHEST 1 VIEW COMPARISON:  March 30, 2019 FINDINGS: Lungs are clear. Heart size and pulmonary vascularity are normal. No adenopathy. No pneumothorax. There is aortic atherosclerosis. No bone lesions. IMPRESSION: Lungs clear. Cardiac silhouette normal. Aortic Atherosclerosis (ICD10-I70.0). Electronically Signed   By: Lowella Grip III M.D.   On: 05/18/2020 10:10   DG HIP UNILAT WITH PELVIS 2-3 VIEWS LEFT  Result Date: 05/18/2020 CLINICAL DATA:  Pain following fall EXAM: DG HIP (WITH OR WITHOUT PELVIS) 2-3V LEFT COMPARISON:  MR pelvis February 19, 2015 FINDINGS: Frontal pelvis as well as frontal and lateral left hip images were obtained. There is an apparent fracture through the midportion of the left superior pubic ramus. Alignment near anatomic in this area. No other fracture is appreciable. No dislocation. There is mild symmetric narrowing of each hip joint. 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 IMPRESSION: 1. Fracture left superior pubic ramus with alignment near anatomic. No other fracture demonstrable. No dislocation. 2.  Mild symmetric narrowing of each hip joint. 3. 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 Electronically Signed   By: Lowella Grip III M.D.   On: 05/18/2020 10:15     ------------------------------------------------------------------------------------------------------ Assessment/Plan: Active Problems:    Fall  Mechanical fall Fracture of  left superior pubic rami -Orthopedic eval pending. -She is in significant pain.  I ordered for IV and oral pain medicines. -PT eval ordered.  Psychiatric issues: Bipolar disorder, fibromyalgia -At home, patient is taking multiple different neuropsych meds namely Cogentin 0.5 mg at bedtime, Wellbutrin 450 mg daily, Depakote 500 mg daily, Lexapro 20 mg daily, Ativan 0.5 mg twice daily, Remeron 45 mg at bedtime, Seroquel 100 mg in the morning, Risperdal 0.5 mg twice daily, tramadol 50 mg 3 times daily as needed. -QTc interval and EKGs 403 ms. -Patient confirms that she is compliant with these medications.  I would resume them for now.  Please assess for any change in mental status.  Seasonal allergies - albuterol, Flonase, Tessalon Perles  GERD -PPI  Mobility: PT eval ordered Code Status:  Full code  DVT prophylaxis:  SCDs Antimicrobials:  None Fluid: Normal saline at 75 mL/h Diet: Clear liquid diet for now.  If orthopedics plans for no intervention, will advance to regular diet.  Consultants: Orthopedics Family Communication:  None at bedside  ------------------------------------------------------------------------------------------------------------------------- Severity of Illness: The appropriate patient status for this patient is OBSERVATION. Observation status is judged to be reasonable and necessary in order to provide the required intensity of service to ensure the patient's safety. The patient's presenting symptoms, physical exam findings, and initial radiographic and laboratory data in the context of their medical condition is felt to place them at decreased risk for further clinical deterioration. Furthermore, it is anticipated that the patient will be medically stable for discharge from the hospital within 2 midnights of admission. The following factors support the patient status of observation.   " The patient's presenting symptoms include  fall, left hip pain. " The physical exam findings include left groin tenderness. " The initial radiographic and laboratory data are left superior pubic rami fracture.    -----------------------------------------------------------------------------------------------------  Signed, Terrilee Croak, MD [facility] Pager: [phone] (Secure Chat preferred). 05/18/2020   Impacting her left hip

## 2020-05-18 NOTE — ED Notes (Signed)
Report given to Jennifer, RN

## 2020-05-18 NOTE — ED Provider Notes (Signed)
[facility] DEPT Provider Note   CSN: [id number] Arrival date & time: 05/18/20  [id number]     History Chief Complaint  Patient presents with  . Hip Pain    [name] is a 74 y.o. female.  HPI     74 year old female reports that she fell today after slipping her flip-flop.  She landed on her left hip.  She is complaining of pain in her left groin.  She denies any other injury.  This occurred around 7 AM and she is unable to get up off the floor.  She did manage to crawl to a phone call to help.  She was then transported here via EMS EMS.  They tied a sheet around her hip.  She received prehospital fentanyl.  States her pain is 10 out of 10 with movement and 1 out of 10 when she is laying still.  She denies any head injury, neck pain, or other trunk or remaining extremity injury.  She is not on any blood thinners.  Denies syncope or loss of consciousness. Covid vaccine series completed by [facility] in February or March per patient  Past Medical History:  Diagnosis Date  . Allergy   . Anemia   . Anxiety   . Arthritis   . Astigmatism   . Bursitis   . Bursitis of hip   . Cancer ([place]) 10/2006   Invasive ductal carcinoma of the right breast   . Carpal tunnel syndrome   . Cataracts, bilateral   . Colitis   . Depression   . 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 Endometriosis   . Fibromyalgia   . Gall stones   . GERD (gastroesophageal reflux disease)   . Hyperlipidemia   . Pneumonia   . Tendon tear     Patient Active Problem List   Diagnosis Date Noted  . Bipolar affective disorder, manic ([place]) 12/28/2017  . Breast cancer, right breast ([place]) 02/04/2014  . ANEMIA 08/22/2010  . DEPRESSION 08/22/2010  . HIATAL HERNIA 08/22/2010  . DERMATITIS 08/22/2010  . CHEST PAIN 08/22/2010  . HYPERGLYCEMIA 08/22/2010  . HYPERLIPIDEMIA 09/16/2008  . ANXIETY DEPRESSION 09/16/2008  . HX OF GALLSTONE 09/16/2008    Past Surgical History:  Procedure Laterality Date    . APPENDECTOMY    . BREAST SURGERY Right 12/11/2006   Lumpectomy  . CARPAL TUNNEL RELEASE    . CYST EXCISION    . ELBOW SURGERY    . LUMBAR FUSION       OB History   No obstetric history on file.     Family History  Problem Relation Age of Onset  . Cancer Mother   . Heart disease Mother   . Cancer Father        Colon cancer  . Heart disease Father   . Transient ischemic attack Father   . Heart disease Brother   . Heart disease Brother   . Vision loss Sister     Social History   Tobacco Use  . Smoking status: Former Research scientist (life sciences)  . Smokeless tobacco: Never Used  . Tobacco comment: 3-4 packs per day x 30 years  Substance Use Topics  . Alcohol use: No    Comment: AA since 1989  . Drug use: No    Comment: stopped 1989    Home Medications Prior to Admission medications   Medication Sig Start Date End Date Taking? Authorizing Provider  albuterol (PROAIR HFA) 108 (90 Base) MCG/ACT inhaler Inhale 2 puffs into  the lungs every 4 (four) hours as needed for wheezing or shortness of breath. 03/09/17   Scot Jun, FNP  benzonatate (TESSALON) 100 MG capsule Take 1-2 capsules (100-200 mg total) by mouth 3 (three) times daily as needed for cough. Patient not taking: Reported on 09/02/2017 05/15/17   Tenna Delaine D, PA-C  buPROPion (WELLBUTRIN XL) 300 MG 24 hr tablet Take 300 mg by mouth daily.      [provider]  cetirizine (ZYRTEC) 10 MG tablet Take 10 mg by mouth daily.      [provider]  escitalopram (LEXAPRO) 20 MG tablet Take 20 mg by mouth daily.     [provider]  fluticasone (FLONASE) 50 MCG/ACT nasal spray Place 2 sprays into both nostrils daily.  10/28/13   [provider]  lansoprazole (PREVACID) 15 MG capsule Take 15 mg by mouth daily.     [provider]  LORazepam (ATIVAN) 0.5 MG tablet Take 1 mg by mouth at bedtime.  10/30/13   [provider]  ondansetron (ZOFRAN) 8 MG tablet Take 8 mg by mouth every 8  (eight) hours as needed.  09/04/13   [provider]  phenazopyridine (PYRIDIUM) 97 MG tablet Take 97 mg by mouth at bedtime.    [provider]  risperiDONE (RISPERDAL) 0.5 MG tablet Take 1 tablet (0.5 mg total) by mouth 2 (two) times daily. 09/02/17   Rutherford Guys, MD  simvastatin (ZOCOR) 20 MG tablet Take 20 mg by mouth at bedtime.      [provider]  traZODone (DESYREL) 50 MG tablet Take 150 mg by mouth at bedtime.  10/30/13   [provider]  Vitamin D, Ergocalciferol, (DRISDOL) 50000 units CAPS capsule Take 50,000 Units by mouth every 7 (seven) days. wednesday    [provider]    Allergies    Augmentin [amoxicillin-pot clavulanate] and Sulfa drugs cross reactors  Review of Systems   Review of Systems  All other systems reviewed and are negative.   Physical Exam Updated Vital Signs BP (!) 151/96 (BP Location: Right Arm)   Pulse 90   Temp 98.1 F (36.7 C) (Oral)   Resp 19   Ht 1.651 m (5\' 5" )   Wt 70 kg   SpO2 96%   BMI 25.68 kg/m   Physical Exam Vitals and nursing note reviewed.  Constitutional:      General: She is not in acute distress.    Appearance: Normal appearance. She is not ill-appearing.  HENT:     Head: Normocephalic.     Right Ear: External ear normal.     Left Ear: External ear normal.     Nose: Nose normal.     Mouth/Throat:     Mouth: Mucous membranes are moist.     Pharynx: Oropharynx is clear.  Eyes:     Pupils: Pupils are equal, round, and reactive to light.  Cardiovascular:     Rate and Rhythm: Normal rate.  Pulmonary:     Effort: Pulmonary effort is normal.     Breath sounds: Normal breath sounds.  Abdominal:     General: Abdomen is flat. Bowel sounds are normal.     Palpations: Abdomen is soft.  Musculoskeletal:        General: Tenderness present. No swelling. Normal range of motion.     Cervical back: Normal range of motion. No tenderness.       Legs:  Skin:    General: Skin is warm  and  dry.     Capillary Refill: Capillary refill takes less than 2 seconds.  Neurological:     General: No focal deficit present.     Mental Status: She is alert and oriented to person, place, and time.     Cranial Nerves: No cranial nerve deficit.     Motor: No weakness.     Coordination: Coordination normal.  Psychiatric:        Mood and Affect: Mood normal.        Behavior: Behavior normal.     ED Results / Procedures / Treatments   Labs (all labs ordered are listed, but only abnormal results are displayed) Labs Reviewed  CBC WITH DIFFERENTIAL/PLATELET  PROTIME-INR  COMPREHENSIVE METABOLIC PANEL  TYPE AND SCREEN    EKG EKG Interpretation  Date/Time:  Wednesday May 18 2020 09:06:01 EDT Ventricular Rate:  88 PR Interval:    QRS Duration: 77 QT Interval:  337 QTC Calculation: 408 R Axis:   35 Text Interpretation: Normal sinus rhythm Normal ECG Confirmed by Pattricia Boss [phone]) on 05/18/2020 9:54:23 AM   Radiology DG Chest Port 1 View  Result Date: 05/18/2020 CLINICAL DATA:  Pain following fall EXAM: PORTABLE CHEST 1 VIEW COMPARISON:  March 30, 2019 FINDINGS: Lungs are clear. Heart size and pulmonary vascularity are normal. No adenopathy. No pneumothorax. There is aortic atherosclerosis. No bone lesions. IMPRESSION: Lungs clear. Cardiac silhouette normal. Aortic Atherosclerosis (ICD10-I70.0). Electronically Signed   By: Lowella Grip III M.D.   On: 05/18/2020 10:10   DG HIP UNILAT WITH PELVIS 2-3 VIEWS LEFT  Result Date: 05/18/2020 CLINICAL DATA:  Pain following fall EXAM: DG HIP (WITH OR WITHOUT PELVIS) 2-3V LEFT COMPARISON:  MR pelvis February 19, 2015 FINDINGS: Frontal pelvis as well as frontal and lateral left hip images were obtained. There is an apparent fracture through the midportion of the left superior pubic ramus. Alignment near anatomic in this area. No other fracture is appreciable. No dislocation. There is mild symmetric narrowing of each hip joint. There is  an apparent enchondroma in the intertrochanteric region on the left measuring 1.8 x 1.6 cm, stable from prior study. IMPRESSION: 1. Fracture left superior pubic ramus with alignment near anatomic. No other fracture demonstrable. No dislocation. 2.  Mild symmetric narrowing of each hip joint. 3. 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 Electronically Signed   By: Lowella Grip III M.D.   On: 05/18/2020 10:15    Procedures Procedures (including critical care time)  Medications Ordered in ED Medications  fentaNYL (SUBLIMAZE) injection 50 mcg (has no administration in time range)  ondansetron (ZOFRAN) injection 4 mg (has no administration in time range)    ED Course  I have reviewed the triage vital signs and the nursing notes.  Pertinent labs & imaging results that were available during my care of the patient were reviewed by me and considered in my medical decision making (see chart for details).  Clinical Course as of May 18 1029  Wed May 18, 2020  1028 Labs and x-rays reviewed    [DR]  1028 Reviewed pelvis and hip x-Zarina Pe- superior ramus fx noted by radiology- I question mild symphysis diastasis-will discuss with ortho   [DR]    Clinical Course User Index [DR] Pattricia Boss, MD   MDM Rules/Calculators/A&P                     74 year old female with fall yesterday with left superior  ramus fracture.  Plan admission for pain control and mobilization.  Care discussed with orthopedic surgery including concern for symphysis diastasis. Patient to remain nonweightbearing and n.p.o. at this time.  Patient care discussed with Dr. Mardelle Matte.  Orthopedic surgery will see in consult. Discussed with Dr. Malva Cogan and will see for admission  Final Clinical Impression(s) / ED Diagnoses Final diagnoses:  Fall, initial encounter  Closed fracture of superior pubic ramus, left, initial encounter [facility])    Rx / DC Orders ED  Discharge Orders    None       Pattricia Boss, MD 05/18/20 1106

## 2020-05-18 NOTE — Consult Note (Signed)
Reason for Consult:Pubic ramus fx Referring Physician: B Dahal  [name] is an 74 y.o. female.  HPI: [name] was walking and tripped on her flip flops. She had immediate left hip pain and could not get up or bear weight. She was brought to the ED where x-rays showed a pubic ramus fx and orthopedic surgery was consulted. She lives alone and does not work.  Past Medical History:  Diagnosis Date  . Allergy   . Anemia   . Anxiety   . Arthritis   . Astigmatism   . Bursitis   . Bursitis of hip   . Cancer ([place]) 10/2006   Invasive ductal carcinoma of the right breast   . Carpal tunnel syndrome   . Cataracts, bilateral   . Colitis   . Depression   . 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 Endometriosis   . Fibromyalgia   . Gall stones   . GERD (gastroesophageal reflux disease)   . Hyperlipidemia   . Pneumonia   . Tendon tear     Past Surgical History:  Procedure Laterality Date  . APPENDECTOMY    . BREAST SURGERY Right 12/11/2006   Lumpectomy  . CARPAL TUNNEL RELEASE    . CYST EXCISION    . ELBOW SURGERY    . LUMBAR FUSION      Family History  Problem Relation Age of Onset  . Cancer Mother   . Heart disease Mother   . Cancer Father        Colon cancer  . Heart disease Father   . Transient ischemic attack Father   . Heart disease Brother   . Heart disease Brother   . Vision loss Sister     Social History:  reports that she has quit smoking. She has never used smokeless tobacco. She reports that she does not drink alcohol or use drugs.  Allergies:  Allergies  Allergen Reactions  . Augmentin [Amoxicillin-Pot Clavulanate]   . Doxycycline   . Sulfa Drugs Cross Reactors   . Fentanyl And Related     Other reaction(s): Confusion    Medications: I have reviewed the patient's current medications.  Results for orders placed or performed during the hospital encounter of 05/18/20 (from the past 48 hour(s))  CBC WITH DIFFERENTIAL     Status: None   Collection Time: 05/18/20   9:14 AM  Result Value Ref Range   WBC 7.4 4.0 - 10.5 K/uL   RBC 4.31 3.87 - 5.11 MIL/uL   Hemoglobin 13.8 12.0 - 15.0 g/dL   HCT 41.4 36.0 - 46.0 %   MCV 96.1 80.0 - 100.0 fL   MCH 32.0 26.0 - 34.0 pg   MCHC 33.3 30.0 - 36.0 g/dL   RDW 12.2 11.5 - 15.5 %   Platelets 185 150 - 400 K/uL   nRBC 0.0 0.0 - 0.2 %   Neutrophils Relative % 70 %   Neutro Abs 5.2 1.7 - 7.7 K/uL   Lymphocytes Relative 19 %   Lymphs Abs 1.4 0.7 - 4.0 K/uL   Monocytes Relative 10 %   Monocytes Absolute 0.7 0.1 - 1.0 K/uL   Eosinophils Relative 0 %   Eosinophils Absolute 0.0 0.0 - 0.5 K/uL   Basophils Relative 0 %   Basophils Absolute 0.0 0.0 - 0.1 K/uL   Immature Granulocytes 1 %   Abs Immature Granulocytes 0.06 0.00 - 0.07 K/uL    Comment: Performed at [facility], [place] [name] [place], [place]  [place]     Status: None   Collection Time: 05/18/20  9:14 AM  Result Value Ref Range   Prothrombin Time 12.7 11.4 - 15.2 seconds   INR 1.0 0.8 - 1.2    Comment: (NOTE) INR goal varies based on device and disease states. Performed at [facility], [place] [address], [place], [place] [address]   Type and screen [place]     Status: None   Collection Time: 05/18/20  9:14 AM  Result Value Ref Range   ABO/RH(D) A POS    Antibody Screen NEG    Sample Expiration      05/21/2020,2359 Performed at [facility], [place] [address], [place], [place] [address]   Comprehensive metabolic panel     Status: Abnormal   Collection Time: 05/18/20  9:14 AM  Result Value Ref Range   Sodium 138 135 - 145 mmol/L   Potassium 4.6 3.5 - 5.1 mmol/L   Chloride 100 98 - 111 mmol/L   CO2 29 22 - 32 mmol/L   Glucose, Bld 109 (H) 70 - 99 mg/dL    Comment: Glucose reference range applies only to samples taken after fasting for at least 8 hours.   BUN 15 8 - 23 mg/dL   Creatinine, Ser 0.73 0.44 - 1.00 mg/dL   Calcium 8.9 8.9 - 10.3 mg/dL    Total Protein 6.5 6.5 - 8.1 g/dL   Albumin 3.7 3.5 - 5.0 g/dL   AST 16 15 - 41 U/L   ALT 14 0 - 44 U/L   Alkaline Phosphatase 39 38 - 126 U/L   Total Bilirubin 0.4 0.3 - 1.2 mg/dL   GFR calc non Af Amer >60 >60 mL/min   GFR calc Af Amer >60 >60 mL/min   Anion gap 9 5 - 15    Comment: Performed at [facility], [place] [address] [place], [place] [address]  SARS Coronavirus 2 by RT PCR (hospital order, performed in [facility] hospital lab) Nasopharyngeal Nasopharyngeal Swab     Status: None   Collection Time: 05/18/20 10:20 AM   Specimen: Nasopharyngeal Swab  Result Value Ref Range   SARS Coronavirus 2 NEGATIVE NEGATIVE    Comment: (NOTE) SARS-CoV-2 target nucleic acids are NOT DETECTED. The SARS-CoV-2 RNA is generally detectable in upper and lower respiratory specimens during the acute phase of infection. The lowest concentration of SARS-CoV-2 viral copies this assay can detect is 250 copies / mL. A negative result does not preclude SARS-CoV-2 infection and should not be used as the sole basis for treatment or other patient management decisions.  A negative result may occur with improper specimen collection / handling, submission of specimen other than nasopharyngeal swab, presence of viral mutation(s) within the areas targeted by this assay, and inadequate number of viral copies (<250 copies / mL). A negative result must be combined with clinical observations, patient history, and epidemiological information. Fact Sheet for Patients:   StrictlyIdeas.no Fact Sheet for Healthcare Providers: BankingDealers.co.za This test is not yet approved or cleared  by the [place] FDA and has been authorized for detection and/or diagnosis of SARS-CoV-2 by FDA under an Emergency Use Authorization (EUA).  This EUA will remain in effect (meaning this test can be used) for the duration of the COVID-19 declaration under Section  564(b)(1) of the Act, 21 U.S.C. section 360bbb-3(b)(1), unless the authorization is terminated or revoked sooner. Performed at [facility], [place] [name]  [place], [place] [address]     DG Chest Port 1 View  Result Date: 05/18/2020 CLINICAL DATA:  Pain following fall EXAM: PORTABLE CHEST 1 VIEW COMPARISON:  March 30, 2019 FINDINGS: Lungs are clear. Heart size and pulmonary vascularity are normal. No adenopathy. No pneumothorax. There is aortic atherosclerosis. No bone lesions. IMPRESSION: Lungs clear. Cardiac silhouette normal. Aortic Atherosclerosis (ICD10-I70.0). Electronically Signed   By: Lowella Grip III M.D.   On: 05/18/2020 10:10   DG HIP UNILAT WITH PELVIS 2-3 VIEWS LEFT  Result Date: 05/18/2020 CLINICAL DATA:  Pain following fall EXAM: DG HIP (WITH OR WITHOUT PELVIS) 2-3V LEFT COMPARISON:  MR pelvis February 19, 2015 FINDINGS: Frontal pelvis as well as frontal and lateral left hip images were obtained. There is an apparent fracture through the midportion of the left superior pubic ramus. Alignment near anatomic in this area. No other fracture is appreciable. No dislocation. There is mild symmetric narrowing of each hip joint. 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 IMPRESSION: 1. Fracture left superior pubic ramus with alignment near anatomic. No other fracture demonstrable. No dislocation. 2.  Mild symmetric narrowing of each hip joint. 3. 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 Electronically Signed   By: Lowella Grip III M.D.   On: 05/18/2020 10:15    Review of Systems  HENT: Negative for ear discharge, ear pain, hearing loss and tinnitus.   Eyes: Negative for photophobia and pain.  Respiratory: Negative for cough and shortness of breath.   Cardiovascular: Negative for chest pain.   Gastrointestinal: Negative for abdominal pain, nausea and vomiting.  Genitourinary: Negative for dysuria, flank pain, frequency and urgency.  Musculoskeletal: Positive for arthralgias (Left hip). Negative for back pain, myalgias and neck pain.  Neurological: Negative for dizziness and headaches.  Hematological: Does not bruise/bleed easily.  Psychiatric/Behavioral: The patient is not nervous/anxious.    Blood pressure 125/81, pulse 94, temperature 98.1 F (36.7 C), temperature source Oral, resp. rate 14, height 5\' 5"  (1.651 m), weight 70 kg, SpO2 95 %. Physical Exam  Constitutional: She appears well-developed and well-nourished. No distress.  HENT:  Head: Normocephalic and atraumatic.  Eyes: Conjunctivae are normal. Right eye exhibits no discharge. Left eye exhibits no discharge. No scleral icterus.  Cardiovascular: Normal rate and regular rhythm.  Respiratory: Effort normal. No respiratory distress.  Musculoskeletal:     Cervical back: Normal range of motion.     Comments: Pelvis--no traumatic wounds or rash, no ecchymosis, stable to manual stress, tender to compression lat>AP  LLE No traumatic wounds, ecchymosis, or rash  Nontender  No knee or ankle effusion  Knee stable to varus/ valgus and anterior/posterior stress  Sens DPN, SPN, TN intact  Motor EHL, ext, flex, evers 5/5  DP 2+, PT 0, No significant edema  Neurological: She is alert.  Skin: Skin is warm and dry. She is not diaphoretic.  Psychiatric: She has a normal mood and affect. Her behavior is normal.    Assessment/Plan: Left superior pubic ramus fx -- May be WBAT BLE. Will need pain control and mobilization before home. She should f/u with Dr. Mardelle Matte in 2-3 weeks. Bipolar d/o    Lisette Abu, PA-C Orthopedic Surgery [phone] 05/18/2020, 12:39 PM

## 2020-05-19 ENCOUNTER — Observation Stay (HOSPITAL_COMMUNITY): Payer: Medicare Other

## 2020-05-19 ENCOUNTER — Inpatient Hospital Stay (HOSPITAL_COMMUNITY): Payer: Medicare Other

## 2020-05-19 DIAGNOSIS — K219 Gastro-esophageal reflux disease without esophagitis: Secondary | ICD-10-CM | POA: Diagnosis present

## 2020-05-19 DIAGNOSIS — R300 Dysuria: Secondary | ICD-10-CM | POA: Diagnosis present

## 2020-05-19 DIAGNOSIS — Z79899 Other long term (current) drug therapy: Secondary | ICD-10-CM | POA: Diagnosis not present

## 2020-05-19 DIAGNOSIS — F430 Acute stress reaction: Secondary | ICD-10-CM | POA: Diagnosis present

## 2020-05-19 DIAGNOSIS — F3110 Bipolar disorder, current episode manic without psychotic features, unspecified: Secondary | ICD-10-CM | POA: Diagnosis not present

## 2020-05-19 DIAGNOSIS — G9340 Encephalopathy, unspecified: Secondary | ICD-10-CM | POA: Diagnosis not present

## 2020-05-19 DIAGNOSIS — Z8249 Family history of ischemic heart disease and other diseases of the circulatory system: Secondary | ICD-10-CM | POA: Diagnosis not present

## 2020-05-19 DIAGNOSIS — Z881 Allergy status to other antibiotic agents status: Secondary | ICD-10-CM | POA: Diagnosis not present

## 2020-05-19 DIAGNOSIS — W010XXA Fall on same level from slipping, tripping and stumbling without subsequent striking against object, initial encounter: Secondary | ICD-10-CM | POA: Diagnosis present

## 2020-05-19 DIAGNOSIS — Z20822 Contact with and (suspected) exposure to covid-19: Secondary | ICD-10-CM | POA: Diagnosis present

## 2020-05-19 DIAGNOSIS — M545 Low back pain: Secondary | ICD-10-CM | POA: Diagnosis present

## 2020-05-19 DIAGNOSIS — E785 Hyperlipidemia, unspecified: Secondary | ICD-10-CM | POA: Diagnosis present

## 2020-05-19 DIAGNOSIS — M25562 Pain in left knee: Secondary | ICD-10-CM

## 2020-05-19 DIAGNOSIS — Z882 Allergy status to sulfonamides status: Secondary | ICD-10-CM | POA: Diagnosis not present

## 2020-05-19 DIAGNOSIS — F319 Bipolar disorder, unspecified: Secondary | ICD-10-CM | POA: Diagnosis present

## 2020-05-19 DIAGNOSIS — M25552 Pain in left hip: Secondary | ICD-10-CM | POA: Diagnosis present

## 2020-05-19 DIAGNOSIS — G9341 Metabolic encephalopathy: Secondary | ICD-10-CM | POA: Diagnosis present

## 2020-05-19 DIAGNOSIS — F419 Anxiety disorder, unspecified: Secondary | ICD-10-CM | POA: Diagnosis present

## 2020-05-19 DIAGNOSIS — Y92009 Unspecified place in unspecified non-institutional (private) residence as the place of occurrence of the external cause: Secondary | ICD-10-CM | POA: Diagnosis not present

## 2020-05-19 DIAGNOSIS — S32512A Fracture of superior rim of left pubis, initial encounter for closed fracture: Principal | ICD-10-CM

## 2020-05-19 DIAGNOSIS — W19XXXA Unspecified fall, initial encounter: Secondary | ICD-10-CM | POA: Diagnosis not present

## 2020-05-19 DIAGNOSIS — Z888 Allergy status to other drugs, medicaments and biological substances status: Secondary | ICD-10-CM | POA: Diagnosis not present

## 2020-05-19 DIAGNOSIS — M797 Fibromyalgia: Secondary | ICD-10-CM | POA: Diagnosis present

## 2020-05-19 DIAGNOSIS — K2970 Gastritis, unspecified, without bleeding: Secondary | ICD-10-CM | POA: Diagnosis present

## 2020-05-19 DIAGNOSIS — R443 Hallucinations, unspecified: Secondary | ICD-10-CM | POA: Diagnosis present

## 2020-05-19 DIAGNOSIS — S32592A Other specified fracture of left pubis, initial encounter for closed fracture: Secondary | ICD-10-CM | POA: Diagnosis not present

## 2020-05-19 DIAGNOSIS — D62 Acute posthemorrhagic anemia: Secondary | ICD-10-CM | POA: Diagnosis present

## 2020-05-19 DIAGNOSIS — Z87891 Personal history of nicotine dependence: Secondary | ICD-10-CM | POA: Diagnosis not present

## 2020-05-19 DIAGNOSIS — Z821 Family history of blindness and visual loss: Secondary | ICD-10-CM | POA: Diagnosis not present

## 2020-05-19 LAB — BASIC METABOLIC PANEL
Anion gap: 10 (ref 5–15)
BUN: 13 mg/dL (ref 8–23)
CO2: 26 mmol/L (ref 22–32)
Calcium: 8.2 mg/dL — ABNORMAL LOW (ref 8.9–10.3)
Chloride: 99 mmol/L (ref 98–111)
Creatinine, Ser: 0.76 mg/dL (ref 0.44–1.00)
GFR calc Af Amer: >60 mL/min (ref >60)
GFR calc non Af Amer: >60 mL/min (ref >60)
Glucose, Bld: 130 mg/dL — ABNORMAL HIGH (ref 70–99)
Potassium: 4.1 mmol/L (ref 3.5–5.1)
Sodium: 135 mmol/L (ref 135–145)

## 2020-05-19 LAB — CBC
HCT: 36.3 % (ref 36.0–46.0)
HCT: 34.8 % — ABNORMAL LOW (ref 36.0–46.0)
HCT: 34 % — ABNORMAL LOW (ref 36.0–46.0)
Hemoglobin: 11.8 g/dL — ABNORMAL LOW (ref 12.0–15.0)
Hemoglobin: 11.4 g/dL — ABNORMAL LOW (ref 12.0–15.0)
Hemoglobin: 11.4 g/dL — ABNORMAL LOW (ref 12.0–15.0)
MCH: 31.6 pg (ref 26.0–34.0)
MCH: 32.6 pg (ref 26.0–34.0)
MCH: 32.9 pg (ref 26.0–34.0)
MCHC: 32.5 g/dL (ref 30.0–36.0)
MCHC: 32.8 g/dL (ref 30.0–36.0)
MCHC: 33.5 g/dL (ref 30.0–36.0)
MCV: 97.3 fL (ref 80.0–100.0)
MCV: 99.4 fL (ref 80.0–100.0)
MCV: 98 fL (ref 80.0–100.0)
Platelets: 147 10*3/uL — ABNORMAL LOW (ref 150–400)
Platelets: 132 10*3/uL — ABNORMAL LOW (ref 150–400)
Platelets: 139 10*3/uL — ABNORMAL LOW (ref 150–400)
RBC: 3.73 MIL/uL — ABNORMAL LOW (ref 3.87–5.11)
RBC: 3.5 MIL/uL — ABNORMAL LOW (ref 3.87–5.11)
RBC: 3.47 MIL/uL — ABNORMAL LOW (ref 3.87–5.11)
RDW: 12.2 % (ref 11.5–15.5)
RDW: 12.5 % (ref 11.5–15.5)
RDW: 12.3 % (ref 11.5–15.5)
WBC: 6.5 10*3/uL (ref 4.0–10.5)
WBC: 7.3 10*3/uL (ref 4.0–10.5)
WBC: 8.7 10*3/uL (ref 4.0–10.5)
nRBC: 0 % (ref 0.0–0.2)
nRBC: 0 % (ref 0.0–0.2)
nRBC: 0 % (ref 0.0–0.2)

## 2020-05-19 MED ORDER — DIPHENHYDRAMINE HCL 12.5 MG/5ML PO ELIX: 12.5000 mg | ORAL_SOLUTION | Freq: Four times a day (QID) | ORAL | Status: DC | PRN

## 2020-05-19 MED ORDER — TRAMADOL HCL 50 MG PO TABS: 50.0000 mg | ORAL_TABLET | Freq: Four times a day (QID) | ORAL | Status: DC | PRN

## 2020-05-19 MED ORDER — HYDROCODONE-ACETAMINOPHEN 5-325 MG PO TABS
1.0000 | ORAL_TABLET | ORAL | Status: DC | PRN
  Administered 2020-05-21: 2 via ORAL
  Filled 2020-05-19: qty 2

## 2020-05-19 MED ORDER — HYDROMORPHONE HCL 1 MG/ML IJ SOLN: 0.5000 mg | INTRAMUSCULAR | Status: DC | PRN

## 2020-05-19 NOTE — Progress Notes (Signed)
PROGRESS NOTE  [name] [id number] DOB: 02/14/1946 DOA: 05/18/2020 PCP: System, Pcp Not In  Brief History   74 year old woman PMH including bipolar disorder, fibromyalgia, presented after mechanical fall at home resulting in sharp shooting left groin pain.  Imaging showed fracture left superior pubic ramus.  A & P  Mechanical fall resulting in fracture left superior ramus fracture with associated pain --Seen by orthopedics, recommendations: Weight-bear as tolerated bilateral lower extremities.  Pain control and mobilization.  Follow-up with Dr. Mardelle Matte in 2-3 weeks. --Continues to have severe pain with movement.  Lives alone.  Does report ex-husband is near.  Acute blood loss anemia, secondary to fracture.  --2 g drop, now may be stable.  Platelets somewhat lower as well. --Hemodynamics stable.  Exam benign.  We will continue to trend hemoglobin.  Left knee pain.  Exam benign.  Reports she has had some pain for some time. --Follow-up x-ray.  Doubt acute injury.  Bipolar disorder on multiple psychotropics including Cogentin, Wellbutrin, Depakote, Lexapro, Ativan, Remeron, Seroquel, Risperdal.  EKG with normal QTC. --Appears stable.  Continue current medications.  Fibromyalgia --Tramadol  Disposition Plan:  Discussion: Pubic rami fracture, fortunately no surgery needed.  However has significant pain impairing mobility, lives alone.  Await PT evaluation.  Patient would like to go home if possible, TOC consultation placed.  Can transfer to home or to other facility once pain is controlled.  Status is: Observation  Dispo: The patient is from: Home              Anticipated d/c is to: home if safe, otherwise may need SNF              Anticipated d/c date is: Likely within 24 hours              Patient currently is not medically stable to d/c.  Pending PT recommendations, CBC stability and pain control.  Once hemoglobin stable pain controlled, can transfer out of hospital  DVT  prophylaxis: SCDs Code Status: Full Family Communication: none    Murray Hodgkins, MD  [facility] Direct contact: see [email or web address] (further directions at bottom of note if needed) 7PM-7AM contact night coverage as at bottom of note 05/19/2020, 11:51 AM  LOS: 0 days   Significant Hospital Events   . 4/26 admitted for pubic fracture with pain   Consults:  . Orthopedics   Procedures:  .    Interval History/Subjective  No pain  She lies still.  10/10 pain with movement of her left leg.  Also reports pain in left knee, seems to have been going on for a while.  Objective   Vitals:  Vitals:   05/19/20 0509 05/19/20 0926  BP: (!) 148/89 105/64  Pulse: 93 86  Resp: 15 13  Temp: 97.8 F (36.6 C) 98.8 F (37.1 C)  SpO2: 92% 91%    Exam:  Constitutional.  Appears calm, comfortable. Respiratory.  Clear to auscultation bilaterally.  No wheezes, rales or rhonchi.  Normal respiratory effort. Cardiovascular.  Regular rate and rhythm.  No murmur, rub or gallop.  No lower extremity edema. Musculoskeletal.  Left knee appears unremarkable.  Nontender to palpation.  Able to flex somewhat although limited by left groin pain. Left upper leg appears unremarkable. Psychiatric.  Grossly normal mood and affect.  Speech fluent and appropriate.  I have personally reviewed the following:   Today's Data  . BMP unremarkable . CBC notable for platelets 147, hemoglobin 11.8  Scheduled Meds: . benztropine  0.5 mg  Oral QHS  . buPROPion  450 mg Oral q AM  . divalproex  500 mg Oral Daily  . escitalopram  20 mg Oral Daily  . fluticasone  2 spray Each Nare QHS  . LORazepam  0.5 mg Oral BID  . mirtazapine  45 mg Oral QHS  . pantoprazole  40 mg Oral Daily  . QUEtiapine  100 mg Oral q AM  . risperiDONE  0.5 mg Oral BID  . senna  1 tablet Oral BID  . simvastatin  20 mg Oral QHS  . sodium chloride flush  3 mL Intravenous Q12H   Continuous Infusions:   Active Problems:   Fall   LOS:  0 days   How to contact the [facility] Attending or Consulting provider [place] or covering provider during after hours [place], for this patient?  1. Check the care team in [facility] and look for a) attending/consulting TRH provider listed and b) the [facility] team listed 2. Log into [email or web address] and use [place]'s universal password to access. If you do not have the password, please contact the hospital operator. 3. Locate the [facility] provider you are looking for under [facility] and page to a number that you can be directly reached. 4. If you still have difficulty reaching the provider, please page the [facility] (Director on Call) for the Hospitalists listed on amion for assistance.

## 2020-05-19 NOTE — Evaluation (Signed)
Physical Therapy Evaluation Patient Details Name: [name] MRN: [id number] DOB: [birth year] Today's Date: 05/19/2020   History of Present Illness  Pt admitted s/p fall and with pelvic fx.  Pt with hx of bipolar, lumbar fusion and Fibromyalgia  Clinical Impression  Pt s/p pelvic fx and presents with functional mobility limitations 2* significant pain with attempts to move or WB.  Pt currently requiring significant assist of 2 for performance of all mobility tasks and with questionable insight into deficits - pt states she is going to walk but struggles with pain on attempt but on sitting states again she is going to get up and walk and on standing states again "I can't walk".  Pt then states she wants to go home but that she does not have any assistance but that she "can probably stay on her ex-husband's sofa for one night".  Pt would benefit from follow up rehab at SNF level to maximize IND and safety prior to return home with limited assist.    Follow Up Recommendations SNF    Equipment Recommendations  None recommended by PT    Recommendations for Other Services OT consult     Precautions / Restrictions Precautions Precautions: Fall Restrictions Weight Bearing Restrictions: No Other Position/Activity Restrictions: WBAT      Mobility  Bed Mobility Overal bed mobility: Needs Assistance Bed Mobility: Supine to Sit;Sit to Supine     Supine to sit: Mod assist;+2 for physical assistance;+2 for safety/equipment Sit to supine: Mod assist;+2 for physical assistance;+2 for safety/equipment   General bed mobility comments: Increased time with cues for sequence.  Physical assist to manage LEs and to control trunk  Transfers Overall transfer level: Needs assistance Equipment used: Rolling walker (2 wheeled) Transfers: Sit to/from [facility] Sit to Stand: Mod assist;+2 safety/equipment;From elevated surface Stand pivot transfers: Mod assist;+2 safety/equipment        General transfer comment: cues for LE management and use of UEs to self assist.  Physical assist to bring wt up and fwd and to balance in initial standing  Ambulation/Gait Ambulation/Gait assistance: Mod assist;+2 physical assistance;+2 safety/equipment Gait Distance (Feet): 4 Feet Assistive device: Rolling walker (2 wheeled) Gait Pattern/deviations: Step-to pattern;Decreased step length - right;Decreased step length - left;Shuffle;Decreased stance time - left Gait velocity: decr   General Gait Details: Cues for posture, position from RW, increased UE WB; Assist for balance and support with noted buckling at knees with attempts to WB on L.  Pt performed stand/pvt bed<>BSC and side shuffled up side of bed only  Stairs            Wheelchair Mobility    Modified Rankin (Stroke Patients Only)       Balance Overall balance assessment: Needs assistance Sitting-balance support: Feet supported;Single extremity supported Sitting balance-Leahy Scale: Fair     Standing balance support: Bilateral upper extremity supported Standing balance-Leahy Scale: Poor                               Pertinent Vitals/Pain Pain Assessment: 0-10 Pain Score: 10-Worst pain ever Pain Location: L groin area Pain Descriptors / Indicators: Aching;Sore Pain Intervention(s): Limited activity within patient's tolerance;Monitored during session;Premedicated before session    [place] expects to be discharged to:: Unsure Living Arrangements: Alone               Additional Comments: Pt states no assist at home and adds "I could probably stay on my  ex-husbands sofa for one night"    Prior Function Level of Independence: Independent               Hand Dominance        Extremity/Trunk Assessment   Upper Extremity Assessment Upper Extremity Assessment: Overall WFL for tasks assessed    Lower Extremity Assessment Lower Extremity Assessment: RLE  deficits/detail;LLE deficits/detail RLE: Unable to fully assess due to pain LLE: Unable to fully assess due to pain       Communication   Communication: No difficulties  Cognition Arousal/Alertness: Awake/alert Behavior During Therapy: Impulsive;Flat affect Overall Cognitive Status: Within Functional Limits for tasks assessed                                 General Comments: Pt following one-step commands inconsistently       General Comments      Exercises     Assessment/Plan    PT Assessment Patient needs continued PT services  PT Problem List Decreased strength;Decreased range of motion;Decreased activity tolerance;Decreased balance;Decreased mobility;Decreased cognition;Decreased knowledge of use of DME;Obesity;Pain       PT Treatment Interventions DME instruction;Gait training;Functional mobility training;Therapeutic activities;Therapeutic exercise;Balance training;Patient/family education    PT Goals (Current goals can be found in the Care Plan section)  Acute Rehab PT Goals Patient Stated Goal: Walk PT Goal Formulation: With patient Time For Goal Achievement: 06/02/20 Potential to Achieve Goals: Fair    Frequency Min 3X/week   Barriers to discharge Decreased caregiver support Lives alone    Co-evaluation               AM-PAC PT "6 Clicks" Mobility  Outcome Measure Help needed turning from your back to your side while in a flat bed without using bedrails?: A Lot Help needed moving from lying on your back to sitting on the side of a flat bed without using bedrails?: A Lot Help needed moving to and from a bed to a chair (including a wheelchair)?: A Lot Help needed standing up from a chair using your arms (e.g., wheelchair or bedside chair)?: A Lot Help needed to walk in hospital room?: A Lot Help needed climbing 3-5 steps with a railing? : Total 6 Click Score: 11    End of Session Equipment Utilized During Treatment: Gait belt Activity  Tolerance: Patient limited by pain;Patient limited by fatigue Patient left: in bed;with call bell/phone within reach;with bed alarm set Nurse Communication: Mobility status PT Visit Diagnosis: History of falling (Z91.81);Difficulty in walking, not elsewhere classified (R26.2);Pain Pain - Right/Left: Left Pain - part of body: Hip    Time: 0940-1010 PT Time Calculation (min) (ACUTE ONLY): 30 min   Charges:   PT Evaluation $PT Eval Low Complexity: 1 Low PT Treatments $Therapeutic Activity: 8-22 mins        Debe Coder PT Acute Rehabilitation Services Pager [phone] Office [phone]   Itzayanna Kaster 05/19/2020, 2:21 PM

## 2020-05-19 NOTE — Progress Notes (Signed)
Subjective:   Patient is alert, laying in bed. States pain is severe in left side of low back as well as left groin. She yelled out for two people who were not in the room during the visit. Denies chest pain, SOB, Calf pain. No nausea/vomiting.     Objective:  PE: VITALS:   Vitals:   05/19/20 0018 05/19/20 0509 05/19/20 0926 05/19/20 1310  BP: 110/71 (!) 148/89 105/64 125/75  Pulse: 80 93 86 93  Resp: 16 15 13 17   Temp: 97.9 F (36.6 C) 97.8 F (36.6 C) 98.8 F (37.1 C) 99.1 F (37.3 C)  TempSrc:   Oral Oral  SpO2: 95% 92% 91% 94%  Weight:      Height:       General: Patient alert, in no acute distress GI: Soft, non-tender, non-distended MSK: TTP to left groin. No TTP over lumbar spine. No pain at right or left hip with internal or external rotation of legs. 2+ DP pulses. Distal sensation intact. EHL and FHL intact bilaterally. No TTP to left knee.   LABS  Results for orders placed or performed during the hospital encounter of 05/18/20 (from the past 24 hour(s))  Basic metabolic panel     Status: Abnormal   Collection Time: 05/19/20  3:28 AM  Result Value Ref Range   Sodium 135 135 - 145 mmol/L   Potassium 4.1 3.5 - 5.1 mmol/L   Chloride 99 98 - 111 mmol/L   CO2 26 22 - 32 mmol/L   Glucose, Bld 130 (H) 70 - 99 mg/dL   BUN 13 8 - 23 mg/dL   Creatinine, Ser 0.76 0.44 - 1.00 mg/dL   Calcium 8.2 (L) 8.9 - 10.3 mg/dL   GFR calc non Af Amer >60 >60 mL/min   GFR calc Af Amer >60 >60 mL/min   Anion gap 10 5 - 15  CBC     Status: Abnormal   Collection Time: 05/19/20  3:28 AM  Result Value Ref Range   WBC 6.5 4.0 - 10.5 K/uL   RBC 3.73 (L) 3.87 - 5.11 MIL/uL   Hemoglobin 11.8 (L) 12.0 - 15.0 g/dL   HCT 36.3 36.0 - 46.0 %   MCV 97.3 80.0 - 100.0 fL   MCH 31.6 26.0 - 34.0 pg   MCHC 32.5 30.0 - 36.0 g/dL   RDW 12.2 11.5 - 15.5 %   Platelets 147 (L) 150 - 400 K/uL   nRBC 0.0 0.0 - 0.2 %  CBC     Status: Abnormal   Collection Time: 05/19/20 11:30 AM  Result Value  Ref Range   WBC 7.3 4.0 - 10.5 K/uL   RBC 3.50 (L) 3.87 - 5.11 MIL/uL   Hemoglobin 11.4 (L) 12.0 - 15.0 g/dL   HCT 34.8 (L) 36.0 - 46.0 %   MCV 99.4 80.0 - 100.0 fL   MCH 32.6 26.0 - 34.0 pg   MCHC 32.8 30.0 - 36.0 g/dL   RDW 12.5 11.5 - 15.5 %   Platelets 132 (L) 150 - 400 K/uL   nRBC 0.0 0.0 - 0.2 %    DG Knee 1-2 Views Left  Result Date: 05/19/2020 CLINICAL DATA:  Pain following fall EXAM: LEFT KNEE - 1-2 VIEW COMPARISON:  December 28, 2010 FINDINGS: Frontal and lateral views obtained. No fracture or dislocation. There is a small joint effusion. There is slight joint space narrowing medially. Other joint spaces appear unremarkable. There is a spur along the anterior superior patella. IMPRESSION:  No fracture or dislocation. Small joint effusion. Mild narrowing medially. Spur along the anterior superior patella likely represents distal quadriceps tendinosis. Electronically Signed   By: Lowella Grip III M.D.   On: 05/19/2020 11:49   DG Chest Port 1 View  Result Date: 05/18/2020 CLINICAL DATA:  Pain following fall EXAM: PORTABLE CHEST 1 VIEW COMPARISON:  March 30, 2019 FINDINGS: Lungs are clear. Heart size and pulmonary vascularity are normal. No adenopathy. No pneumothorax. There is aortic atherosclerosis. No bone lesions. IMPRESSION: Lungs clear. Cardiac silhouette normal. Aortic Atherosclerosis (ICD10-I70.0). Electronically Signed   By: Lowella Grip III M.D.   On: 05/18/2020 10:10   DG HIP UNILAT WITH PELVIS 2-3 VIEWS LEFT  Result Date: 05/18/2020 CLINICAL DATA:  Pain following fall EXAM: DG HIP (WITH OR WITHOUT PELVIS) 2-3V LEFT COMPARISON:  MR pelvis February 19, 2015 FINDINGS: Frontal pelvis as well as frontal and lateral left hip images were obtained. There is an apparent fracture through the midportion of the left superior pubic ramus. Alignment near anatomic in this area. No other fracture is appreciable. No dislocation. There is mild symmetric narrowing of each hip joint. 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 IMPRESSION: 1. Fracture left superior pubic ramus with alignment near anatomic. No other fracture demonstrable. No dislocation. 2.  Mild symmetric narrowing of each hip joint. 3. 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 Electronically Signed   By: Lowella Grip III M.D.   On: 05/18/2020 10:15    Assessment/Plan: Active Problems:   Fall  Left superior pubic ramus fracture - not indicated for surgery, will be treating non-operatively  - Weightbearing: WBAT BLE - VTE prophylaxis: SCD's - Pain control: continue current regimen - Follow - up plan: in 2 weeks with Dr. Lars Masson 05/19/2020, 2:34 PM   Contact information:   Weekdays 8-5 Merlene Pulling, PA-C [phone] A fter hours and holidays please check [email or web address] for group call information for [facility]

## 2020-05-19 NOTE — Progress Notes (Signed)
19:20 Patient returned from X-Ray with report from NT that only one image done. Patient assisted to [facility] then bed, and Writer observed Patient "Stop that" What do you see?  "My dog is licking on something."  Year 2007, President Wynetta Emery, Thought she was still in X-Ray and waiting for a bed. Could state her name and location. Text to On-Call requesting direction with HS medications.

## 2020-05-19 NOTE — Progress Notes (Signed)
Pharmacy IV to PO conversion  The patient is receiving diphenhydramine by the intravenous route.  Based on the following criteria approved by the Pharmacy and [place], diphenhydramine is being converted to the equivalent oral dose form.   Not prescribed to treat or prevent a severe allergic reaction   Not prescribed as premedication prior to receiving blood product, biologic medication, antimicrobial, or chemotherapy agent   The patient has tolerated at least one dose of an oral or enteral medication   The patient has no evidence of active gastrointestinal bleeding or impaired GI absorption (gastrectomy, short bowel, patient on TNA or NPO).   The patient is not undergoing procedural sedation  If you have any questions about this conversion, please contact the Pharmacy Department (ext [phone]).  Thank you.  Reuel Boom, PharmD, BCPS [phone] 05/19/2020, 3:08 PM

## 2020-05-20 ENCOUNTER — Inpatient Hospital Stay (HOSPITAL_COMMUNITY): Payer: Medicare Other

## 2020-05-20 LAB — BASIC METABOLIC PANEL
Anion gap: 11 (ref 5–15)
BUN: 9 mg/dL (ref 8–23)
CO2: 28 mmol/L (ref 22–32)
Calcium: 8.7 mg/dL — ABNORMAL LOW (ref 8.9–10.3)
Chloride: 101 mmol/L (ref 98–111)
Creatinine, Ser: 0.72 mg/dL (ref 0.44–1.00)
GFR calc Af Amer: >60 mL/min (ref >60)
GFR calc non Af Amer: >60 mL/min (ref >60)
Glucose, Bld: 118 mg/dL — ABNORMAL HIGH (ref 70–99)
Potassium: 4 mmol/L (ref 3.5–5.1)
Sodium: 140 mmol/L (ref 135–145)

## 2020-05-20 LAB — CBC
HCT: 36.9 % (ref 36.0–46.0)
Hemoglobin: 12.1 g/dL (ref 12.0–15.0)
MCH: 31.9 pg (ref 26.0–34.0)
MCHC: 32.8 g/dL (ref 30.0–36.0)
MCV: 97.4 fL (ref 80.0–100.0)
Platelets: 149 10*3/uL — ABNORMAL LOW (ref 150–400)
RBC: 3.79 MIL/uL — ABNORMAL LOW (ref 3.87–5.11)
RDW: 12.2 % (ref 11.5–15.5)
WBC: 9.6 10*3/uL (ref 4.0–10.5)
nRBC: 0 % (ref 0.0–0.2)

## 2020-05-20 MED ORDER — QUETIAPINE FUMARATE 50 MG PO TABS
150.0000 mg | ORAL_TABLET | Freq: Every day | ORAL | Status: DC
  Administered 2020-05-21 – 2020-05-26 (×6): 150 mg via ORAL
  Filled 2020-05-20 (×6): qty 3

## 2020-05-20 NOTE — NC FL2 (Signed)
[place] LEVEL OF CARE SCREENING TOOL     IDENTIFICATION  Patient Name: [name] Birthdate: 01-02-1946 Sex: female Admission Date (Current Location): 05/18/2020  [facility] and [place] Number:  Herbalist and Address:  [facility],  [address] [place], [place]      Provider Number: [id number]  Attending Physician Name and Address:  Samuella Cota, MD  Relative Name and Phone Number:       Current Level of Care: SNF Recommended Level of Care: [place] Prior Approval Number:    Date Approved/Denied:   PASRR Number:    Discharge Plan: SNF    Current Diagnoses: Patient Active Problem List   Diagnosis Date Noted  . Fall 05/18/2020  . Bipolar affective disorder, manic ([place]) 12/28/2017  . Breast cancer, right breast ([place]) 02/04/2014  . ANEMIA 08/22/2010  . DEPRESSION 08/22/2010  . HIATAL HERNIA 08/22/2010  . DERMATITIS 08/22/2010  . CHEST PAIN 08/22/2010  . HYPERGLYCEMIA 08/22/2010  . HYPERLIPIDEMIA 09/16/2008  . ANXIETY DEPRESSION 09/16/2008  . HX OF GALLSTONE 09/16/2008    Orientation RESPIRATION BLADDER Height & Weight     Self, Situation, Place  Normal Incontinent Weight: 154 lb 5.2 oz (70 kg) Height:  5\' 5"  (165.1 cm)  BEHAVIORAL SYMPTOMS/MOOD NEUROLOGICAL BOWEL NUTRITION STATUS      Continent Diet(Regular)  AMBULATORY STATUS COMMUNICATION OF NEEDS Skin   Extensive Assist Verbally Normal                       Personal Care Assistance Level of Assistance  Bathing, Feeding, Dressing Bathing Assistance: Maximum assistance Feeding assistance: Independent Dressing Assistance: Maximum assistance     Functional Limitations Info  Sight, Hearing, Speech Sight Info: Impaired Hearing Info: Adequate Speech Info: Adequate    SPECIAL CARE FACTORS FREQUENCY  PT (By licensed PT), OT (By licensed OT)     PT Frequency: 5X/WEEK OT Frequency: 5X/WEEK            Contractures Contractures  Info: Not present    Additional Factors Info  Allergies, Psychotropic Code Status Info: Fullcode Allergies Info: Allergies: Augmentin Amoxicillin-pot Clavulanate, Doxycycline, Sulfa Drugs Cross Reactors, Fentanyl And Related Psychotropic Info: Wellbutrin, Depakote, Lexapro, Seroquel         Current Medications (05/20/2020):  This is the current hospital active medication list Current Facility-Administered Medications  Medication Dose Route Frequency Provider Last Rate Last Admin  . acetaminophen (TYLENOL) tablet 650 mg  650 mg Oral Q6H PRN Terrilee Croak, MD   650 mg at 05/20/20 0620   Or  . acetaminophen (TYLENOL) suppository 650 mg  650 mg Rectal Q6H PRN Dahal, Binaya, MD      . albuterol (VENTOLIN HFA) 108 (90 Base) MCG/ACT inhaler 2 puff  2 puff Inhalation Q4H PRN Dahal, Binaya, MD      . benztropine (COGENTIN) tablet 0.5 mg  0.5 mg Oral QHS Dahal, Binaya, MD   0.5 mg at 05/19/20 2248  . buPROPion (WELLBUTRIN XL) 24 hr tablet 450 mg  450 mg Oral q AM Terrilee Croak, MD   450 mg at 05/20/20 [id number]  . diphenhydrAMINE (BENADRYL) 12.5 MG/5ML elixir 12.5 mg  12.5 mg Oral Q6H PRN Polly Cobia, RPH      . divalproex (DEPAKOTE ER) 24 hr tablet 500 mg  500 mg Oral Daily Dahal, Binaya, MD   500 mg at 05/20/20 1049  . escitalopram (LEXAPRO) tablet 20 mg  20 mg Oral Daily Dahal, Marlowe Aschoff, MD  20 mg at 05/20/20 1049  . fluticasone (FLONASE) 50 MCG/ACT nasal spray 2 spray  2 spray Each Nare QHS Terrilee Croak, MD   2 spray at 05/19/20 2250  . HYDROcodone-acetaminophen (NORCO/VICODIN) 5-325 MG per tablet 1-2 tablet  1-2 tablet Oral Q4H PRN Samuella Cota, MD      . HYDROmorphone (DILAUDID) injection 0.5 mg  0.5 mg Intravenous Q3H PRN Samuella Cota, MD      . LORazepam (ATIVAN) tablet 0.5 mg  0.5 mg Oral BID Dahal, Marlowe Aschoff, MD   0.5 mg at 05/20/20 1049  . magnesium hydroxide (MILK OF MAGNESIA) suspension 30 mL  30 mL Oral Daily PRN Terrilee Croak, MD   30 mL at 05/19/20 2248  . mirtazapine  (REMERON) tablet 45 mg  45 mg Oral QHS Terrilee Croak, MD   45 mg at 05/19/20 2247  . ondansetron (ZOFRAN) tablet 4 mg  4 mg Oral Q6H PRN Dahal, Binaya, MD       Or  . ondansetron (ZOFRAN) injection 4 mg  4 mg Intravenous Q6H PRN Dahal, Binaya, MD      . ondansetron (ZOFRAN) tablet 8 mg  8 mg Oral Q8H PRN Dahal, Binaya, MD      . pantoprazole (PROTONIX) EC tablet 40 mg  40 mg Oral Daily Dahal, Binaya, MD   40 mg at 05/20/20 1049  . [START ON 05/21/2020] QUEtiapine (SEROQUEL) tablet 150 mg  150 mg Oral QHS Samuella Cota, MD      . senna [facility]) tablet 8.6 mg  1 tablet Oral BID Terrilee Croak, MD   8.6 mg at 05/20/20 1049  . simvastatin (ZOCOR) tablet 20 mg  20 mg Oral QHS Terrilee Croak, MD   20 mg at 05/19/20 2247  . sodium chloride flush (NS) 0.9 % injection 3 mL  3 mL Intravenous Q12H Dahal, Marlowe Aschoff, MD   3 mL at 05/19/20 2253  . sorbitol 70 % solution 30 mL  30 mL Oral Daily PRN Dahal, Marlowe Aschoff, MD      . traMADol Veatrice Bourbon) tablet 50 mg  50 mg Oral Q6H PRN Samuella Cota, MD         Discharge Medications: Please see discharge summary for a list of discharge medications.  Relevant Imaging Results:  Relevant Lab Results:   Additional Information SSN: [id number]  Lia Hopping, LCSW

## 2020-05-20 NOTE — Progress Notes (Signed)
PROGRESS NOTE  [name] [id number] DOB: 27-Feb-1946 DOA: 05/18/2020 PCP: System, Pcp Not In  Brief History   74 year old woman PMH including bipolar disorder, fibromyalgia, presented after mechanical fall at home resulting in sharp shooting left groin pain.  Imaging showed fracture left superior pubic ramus.  A & P  Mechanical fall resulting in fracture left superior ramus fracture with associated pain --Continue recommendations as per orthopedics.  Weight-bear as tolerated.   Pain control and mobilization.  Follow-up with Dr. Mardelle Matte in 2 weeks. --Pain severe with movement.  Will need SNF.  Left knee pain.  Exam benign.  Reports she has had some pain for some time. --X-ray unrevealing.  Management per orthopedics.  Bipolar disorder on multiple psychotropics including Cogentin, Wellbutrin, Depakote, Lexapro, Ativan, Remeron, Seroquel.  EKG with normal QTC. --Appears stable.  Pharmacy reviewed medications, patient no longer Risperdal.  Seroquel dose adjusted.  Fibromyalgia --Tramadol  Disposition Plan:  Discussion: Has significant pain from pubic ramus fracture, discharged home not safe, will require SNF per therapy recommendations.  Status is: Inpatient  Dispo: The patient is from: Home              Anticipated d/c is to: SNF              Anticipated d/c date is: 5/31              Patient currently medically stable for discharge.    DVT prophylaxis: SCDs Code Status: Full Family Communication: none    Murray Hodgkins, MD  [facility] Direct contact: see [email or web address] (further directions at bottom of note if needed) 7PM-7AM contact night coverage as at bottom of note 05/20/2020, 6:17 PM  LOS: 1 day   Significant Hospital Events   . 4/26 admitted for pubic fracture with pain   Consults:  . Orthopedics   Procedures:  .    Interval History/Subjective  Reports pain is severe.  Objective   Vitals:  Vitals:   05/20/20 0555 05/20/20 1434  BP: (!) 149/83  138/79  Pulse: 90 87  Resp: 18 17  Temp: 99.1 F (37.3 C) 99.9 F (37.7 C)  SpO2: 91% 100%    Exam:  Constitutional.  Appears calm and comfortable. Respiratory.  Clear to auscultation bilaterally.  No wheezes, rales or rhonchi.  Normal respiratory effort. Cardiovascular.  Regular rate and rhythm.  No murmur, rub or gallop.  No lower extremity edema. Musculoskeletal.  Moves both legs to command. Psychiatric.  Oriented to self, location, year, probably with June.  I have personally reviewed the following:   Today's Data  . Basic metabolic panel unremarkable . Platelet count stable at 149, remainder CBC unremarkable. . CT negative for lumbar fracture.  Scheduled Meds: . benztropine  0.5 mg Oral QHS  . buPROPion  450 mg Oral q AM  . divalproex  500 mg Oral Daily  . escitalopram  20 mg Oral Daily  . fluticasone  2 spray Each Nare QHS  . LORazepam  0.5 mg Oral BID  . mirtazapine  45 mg Oral QHS  . pantoprazole  40 mg Oral Daily  . [START ON 05/21/2020] QUEtiapine  150 mg Oral QHS  . senna  1 tablet Oral BID  . simvastatin  20 mg Oral QHS  . sodium chloride flush  3 mL Intravenous Q12H   Continuous Infusions:   Active Problems:   Fall   LOS: 1 day   How to contact the [facility] Attending or Consulting provider [place] or covering provider  during after hours 7P -7A, for this patient?  1. Check the care team in [facility] and look for a) attending/consulting TRH provider listed and b) the [facility] team listed 2. Log into [email or web address] and use [place]'s universal password to access. If you do not have the password, please contact the hospital operator. 3. Locate the [facility] provider you are looking for under [facility] and page to a number that you can be directly reached. 4. If you still have difficulty reaching the provider, please page the [facility] (Director on Call) for the Hospitalists listed on amion for assistance.

## 2020-05-20 NOTE — TOC Progression Note (Signed)
Transition of Care [facility]) - Progression Note    Patient Details  Name: [name] MRN: [id number] Date of Birth: [birth year]  Transition of Care [facility]) CM/SW [place], LCSW Phone Number: 05/20/2020, 4:41 PM  Clinical Narrative:    PASRR level II manuel review.    Expected Discharge Plan: Skilled Nursing Facility Barriers to Discharge: Continued Medical Work up, Ship broker, Environmental education officer)  Expected Discharge Plan and Services Expected Discharge Plan: [place] In-house Referral: Clinical Social Work Discharge Planning Services: CM Consult Post Acute Care Choice: [place] Living arrangements for the past 2 months: Apartment                 DME Arranged: N/A DME Agency: NA       HH Arranged: NA HH Agency: NA         Social Determinants of Health (SDOH) Interventions    Readmission Risk Interventions No flowsheet data found.

## 2020-05-20 NOTE — TOC Initial Note (Addendum)
Transition of Care [facility]) - Initial/Assessment Note    Patient Details  Name: [name] MRN: [id number] Date of Birth: 1946-06-22  Transition [facility]) CM/SW Contact:    Lia Hopping, [place] Phone Number: 05/20/2020, 1:05 PM  Clinical Narrative:                 CSW met with the patient at bedside to discuss discharge plan to SNF. Patient agreeable to SNF placement. Patient lives alone and was independent with ADL's prior to fall. Patient reports she still drives. Patient does not have family in area but has friends. Patient report a friend will keep her dog CSW explain the SNF process and the medicare 3 night inpatient stay requirement, patient report understanding. TOC staff will follow up with bed offers.  FL2 completed.  PASRR level II.    Expected Discharge Plan: Skilled Nursing Facility Barriers to Discharge: Insurance Authorization, Continued Medical Work up   Patient Goals and CMS Choice Patient states their goals for this hospitalization and ongoing recovery are:: go to rehab CMS Medicare.gov Compare Post Acute Care list provided to:: Patient Choice offered to / list presented to : Patient  Expected Discharge Plan and Services Expected Discharge Plan: [place] In-house Referral: Clinical Social Work Discharge Planning Services: CM Consult Post Acute Care Choice: [place] arrangements for the past 2 months: Apartment                 DME Arranged: N/A DME Agency: NA       HH Arranged: NA [place] Agency: NA        Prior Living Arrangements/Services Living arrangements for the past 2 months: Apartment Lives with:: Self Patient language and need for interpreter reviewed:: No Do you feel safe going back to the place where you live?: Yes      Need for Family Participation in Patient Care: Yes (Comment) Care giver support system in place?: Yes (comment)   Criminal Activity/Legal Involvement Pertinent to Current  Situation/Hospitalization: No - Comment as needed  Activities of Daily Living Home Assistive Devices/Equipment: Eyeglasses ADL Screening (condition at time of admission) Patient's cognitive ability adequate to safely complete daily activities?: Yes Is the patient deaf or have difficulty hearing?: No Does the patient have difficulty seeing, even when wearing glasses/contacts?: No Does the patient have difficulty concentrating, remembering, or making decisions?: No Patient able to express need for assistance with ADLs?: Yes Does the patient have difficulty dressing or bathing?: No Independently performs ADLs?: No Communication: Independent Dressing (OT): Needs assistance Is this a change from baseline?: Change from baseline, expected to last >3 days Grooming: Needs assistance Is this a change from baseline?: Change from baseline, expected to last >3 days Feeding: Needs assistance Is this a change from baseline?: Change from baseline, expected to last >3 days Bathing: Needs assistance Is this a change from baseline?: Change from baseline, expected to last >3 days Toileting: Needs assistance Is this a change from baseline?: Change from baseline, expected to last >3days In/Out Bed: Needs assistance Is this a change from baseline?: Change from baseline, expected to last >3 days Walks in Home: Needs assistance Is this a change from baseline?: Change from baseline, expected to last >3 days Does the patient have difficulty walking or climbing stairs?: Yes Weakness of Legs: Left Weakness of Arms/Hands: None  Permission Sought/Granted Permission sought to share information with : Facility Art therapist granted to share information with : Yes, Verbal Permission Granted  Permission granted to share info w AGENCY: SNF's in the area        Emotional Assessment Appearance:: Appears stated age Attitude/Demeanor/Rapport: Engaged Affect (typically observed):  Accepting Orientation: : Oriented to Self, Oriented to Situation, Oriented to Place Alcohol / Substance Use: Not Applicable Psych Involvement: No (comment)  Admission diagnosis:  Fall [W19.XXXA] Fall, initial encounter [id number]] Closed fracture of superior pubic ramus, left, initial encounter [facility] [S32.512A] Patient Active Problem List   Diagnosis Date Noted  . Fall 05/18/2020  . Bipolar affective disorder, manic ([place]) 12/28/2017  . Breast cancer, right breast ([place]) 02/04/2014  . ANEMIA 08/22/2010  . DEPRESSION 08/22/2010  . HIATAL HERNIA 08/22/2010  . DERMATITIS 08/22/2010  . CHEST PAIN 08/22/2010  . HYPERGLYCEMIA 08/22/2010  . HYPERLIPIDEMIA 09/16/2008  . ANXIETY DEPRESSION 09/16/2008  . HX OF GALLSTONE 09/16/2008   PCP:  System, Pcp Not In Pharmacy:   [facility] [place] [address] Phone: [phone] Fax: [phone]     Social Determinants of Health (SDOH) Interventions    Readmission Risk Interventions No flowsheet data found.

## 2020-05-20 NOTE — Progress Notes (Signed)
Physical Therapy Treatment Patient Details Name: [name] MRN: [id number] DOB: 25-Feb-1946 Today's Date: 05/20/2020    History of Present Illness Pt admitted s/p fall and with pelvic fx.  Pt with hx of bipolar, lumbar fusion and Fibromyalgia    PT Comments    Pt awake and aware she is in the hospital but still confused on recent events and current mobility ability.  Pt stated "I'm wet" so assisted OOB to [facility].  General bed mobility comments: Increased time with cues for sequence.  Physical assist to manage LEs and to control trunk.  Used bed pad to complete scooting to EOB.  General transfer comment: forst assisted from elevated bed to [facility] 1/4 pivot required Max Assist to complete.  Great difficulty self rising and partial turn/near miss to [facility].  Then required + 2 side by side assist to rise from [facility] with 75% VC's on proper hand placement and to push self up as pt was grasping and pulling down.  Also present with B knee buckle and inability to stand erect.  Used EVA walker to complete upright stance in order to perfrom peri care/hygiene after commode use.  Poor self correction and limited stance time.  Qucklt swithed BSC with recliner from behind.  Uncontrolled knee buckle desend to chair. General Gait Details: unable to attempt due to poor transfer ability.  Positioned in recliner with multiple pillows.    Follow Up Recommendations  SNF     Equipment Recommendations  None recommended by PT    Recommendations for Other Services       Precautions / Restrictions Precautions Precautions: Fall Restrictions Weight Bearing Restrictions: No RLE Weight Bearing: Weight bearing as tolerated LLE Weight Bearing: Weight bearing as tolerated    Mobility  Bed Mobility Overal bed mobility: Needs Assistance Bed Mobility: Supine to Sit     Supine to sit: Max assist;Total assist     General bed mobility comments: Increased time with cues for sequence.  Physical assist to manage LEs and to  control trunk.  Used bed pad to complete scooting to EOB.  Transfers Overall transfer level: Needs assistance Equipment used: None;Rolling walker (2 wheeled) Transfers: Sit to/from [facility] Sit to Stand: Max assist;Total assist;+2 physical assistance;+2 safety/equipment;From elevated surface Stand pivot transfers: Total assist;+2 physical assistance;+2 safety/equipment;From elevated surface       General transfer comment: forst assisted from elevated bed to [facility] 1/4 pivot required Max Assist to complete.  Great difficulty self rising and partial turn/near miss to [facility].  Then required + 2 side by side assist to rise from [facility] with 75% VC's on proper hand placement and to push self up as pt was grasping and pulling down.  Also present with B knee buckle and inability to stand erect.  Used EVA walker to complete upright stance in order to perfrom peri care/hygiene after commode use.  Poor self correction and limited stance time.  Qucklt swithed BSC with recliner from behind.  Uncontrolled knee buckle desend to chair.  Ambulation/Gait             General Gait Details: unable to attempt due to poor transfer ability   Stairs             Wheelchair Mobility    Modified Rankin (Stroke Patients Only)       Balance  Cognition Arousal/Alertness: Awake/alert   Overall Cognitive Status: Impaired/Different from baseline Area of Impairment: Memory;Following commands;Safety/judgement;Awareness;Problem solving                       Following Commands: Follows one step commands inconsistently Safety/Judgement: Decreased awareness of safety   Problem Solving: Requires verbal cues General Comments: requiring repeat functional commands, easily distracted.      Exercises      General Comments        Pertinent Vitals/Pain Pain Assessment: Faces Faces Pain Scale: Hurts even more Pain  Location: L low back/buttock/hip Pain Descriptors / Indicators: Aching;Sore;Grimacing Pain Intervention(s): Monitored during session;Repositioned    Home Living                      Prior Function            PT Goals (current goals can now be found in the care plan section) Progress towards PT goals: Progressing toward goals    Frequency    Min 3X/week      PT Plan Current plan remains appropriate    Co-evaluation              AM-PAC PT "6 Clicks" Mobility   Outcome Measure  Help needed turning from your back to your side while in a flat bed without using bedrails?: A Lot Help needed moving from lying on your back to sitting on the side of a flat bed without using bedrails?: A Lot Help needed moving to and from a bed to a chair (including a wheelchair)?: Total Help needed standing up from a chair using your arms (e.g., wheelchair or bedside chair)?: Total Help needed to walk in hospital room?: Total Help needed climbing 3-5 steps with a railing? : Total 6 Click Score: 8    End of Session Equipment Utilized During Treatment: Gait belt Activity Tolerance: Patient limited by pain;Patient limited by fatigue Patient left: in chair;with call bell/phone within reach Nurse Communication: Mobility status PT Visit Diagnosis: History of falling (Z91.81);Difficulty in walking, not elsewhere classified (R26.2);Pain Pain - Right/Left: Left Pain - part of body: Hip     Time: 1206-1232 PT Time Calculation (min) (ACUTE ONLY): 26 min  Charges:  $Therapeutic Activity: 23-37 mins                     Rica Koyanagi  PTA Acute  Rehabilitation Services Pager      [phone] Office      [phone]

## 2020-05-20 NOTE — Progress Notes (Signed)
Transition of Care (TOC) -30 day Note          Patient Details  Name: [name]   MRN: [id number]  Date of Birth: January 15, 1946     Transition of Care [facility]) CM/SW Contact    Name: Kathrin Greathouse  Phone Number: [phone]  Date: 05/20/2020  Time: 1:34PM     MUST ID:     To Whom it May Concern:     Please be advised that the above patient will require a short-term nursing home stay, anticipated 30 days or less rehabilitation and strengthening. The plan is for return home.
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
## 2020-05-21 ENCOUNTER — Encounter (HOSPITAL_COMMUNITY): Payer: Self-pay | Admitting: Family Medicine

## 2020-05-21 DIAGNOSIS — F3110 Bipolar disorder, current episode manic without psychotic features, unspecified: Secondary | ICD-10-CM

## 2020-05-21 DIAGNOSIS — G9340 Encephalopathy, unspecified: Secondary | ICD-10-CM

## 2020-05-21 DIAGNOSIS — S32592A Other specified fracture of left pubis, initial encounter for closed fracture: Secondary | ICD-10-CM | POA: Diagnosis present

## 2020-05-21 MED ORDER — LORAZEPAM 1 MG PO TABS: 1.0000 mg | ORAL_TABLET | Freq: Once | ORAL | Status: DC

## 2020-05-21 MED ORDER — TRAMADOL HCL 50 MG PO TABS
50.0000 mg | ORAL_TABLET | Freq: Four times a day (QID) | ORAL | Status: DC | PRN
  Administered 2020-05-21 – 2020-05-22 (×3): 50 mg via ORAL
  Filled 2020-05-21 (×3): qty 1

## 2020-05-21 MED ORDER — LORAZEPAM 2 MG/ML IJ SOLN
1.0000 mg | Freq: Once | INTRAMUSCULAR | Status: AC
  Administered 2020-05-21: 1 mg via INTRAMUSCULAR
  Filled 2020-05-21: qty 1

## 2020-05-21 MED ORDER — LORAZEPAM 2 MG/ML IJ SOLN: 1.0000 mg | Freq: Once | INTRAMUSCULAR | Status: DC

## 2020-05-21 NOTE — Progress Notes (Signed)
Pt is alert and calm, Safety sitter at bedside. All needs met.

## 2020-05-21 NOTE — Progress Notes (Addendum)
PROGRESS NOTE  [name] [id number] DOB: 1946/01/13 DOA: 05/18/2020 PCP: System, Pcp Not In  Brief History   74 year old woman PMH including bipolar disorder, fibromyalgia, presented after mechanical fall at home resulting in sharp shooting left groin pain.  Imaging showed fracture left superior pubic ramus.  A & P  Mechanical fall resulting in fracture left superior ramus fracture with associated pain --Continue recommendations as per orthopedics.  Weight-bear as tolerated.   Pain control and mobilization.  Follow-up with Dr. Mardelle Matte in 2 weeks. --Pain severe with movement.  Continue pain management.  Will need SNF.  Left knee pain.  Exam benign.  Reports she has had some pain for some time. --X-ray unrevealing.  Management per orthopedics.  Bipolar disorder on multiple psychotropics including Cogentin, Wellbutrin, Depakote, Lexapro, Ativan, Remeron, Seroquel.  EKG with normal QTC. --Appears stable.  Pharmacy reviewed medications, patient no longer Risperdal.  Seroquel dose adjusted.  Acute encephalopathy. --Discussed with ex-husband by telephone who reports she has had several episodes of psychiatric exacerbation while hospitalized for other illnesses requiring at times weekly psychiatric hospitalization.  In the past this seems to been triggered by perhaps some kind of medication or illness itself.  Symptoms this time are consistent with the past, with delusions and hallucinations. --Patient appears to be on usual psychiatric medications.  Will stop narcotic and proceed with tramadol which she takes regularly at home.  Will request psychiatry consultation for further recommendations. --Currently on holiday weekend but will reach out to the patient's psychiatrist when able  Fibromyalgia --Tramadol  Disposition Plan:  Discussion: Continue treatment for pain for left superior ramus fracture.  Of note patient mildly confused, unable to care for self at home.  Status is:  Inpatient  Dispo: The patient is from: Home              Anticipated d/c is to: SNF              Anticipated d/c date is: 5/31?              Patient medically stable but confused  DVT prophylaxis: SCDs Code Status: Full Family Communication: Discussed in detail with asked husband.  Time greater than 35 minutes, greater than 50% counseling coordination of care.    Murray Hodgkins, MD  [facility] Direct contact: see [email or web address] (further directions at bottom of note if needed) 7PM-7AM contact night coverage as at bottom of note 05/21/2020, 12:59 PM  LOS: 2 days   Significant Hospital Events   . 4/26 admitted for pubic fracture with pain   Consults:  . Orthopedics   Procedures:  .    Interval History/Subjective  Seems to be confused.  History does not seem to be reliable.  Objective   Vitals:  Vitals:   05/21/20 0633 05/21/20 0746  BP: (!) 156/127 139/77  Pulse: (!) 105 87  Resp: 15 16  Temp: 98.6 F (37 C)   SpO2: 97% 95%    Exam:  Constitutional.  Appears calm and comfortable sitting in chair. Cardiovascular.  Regular rate and rhythm.  No murmur, rub or gallop.  No lower extremity edema. Respiratory.  Clear to auscultation bilaterally.  No wheezes, rales or rhonchi.  Normal respiratory effort. Musculoskeletal.  Moves both legs to command. Psychiatric.  Seems to be more confused today, speech and appropriate, thinks that I am a relative of her brother and at his house, May, 2021  I have personally reviewed the following:   Today's Data  . No new data  Scheduled Meds: . benztropine  0.5 mg Oral QHS  . buPROPion  450 mg Oral q AM  . divalproex  500 mg Oral Daily  . escitalopram  20 mg Oral Daily  . fluticasone  2 spray Each Nare QHS  . LORazepam  0.5 mg Oral BID  . mirtazapine  45 mg Oral QHS  . pantoprazole  40 mg Oral Daily  . QUEtiapine  150 mg Oral QHS  . senna  1 tablet Oral BID  . simvastatin  20 mg Oral QHS  . sodium chloride flush  3 mL  Intravenous Q12H   Continuous Infusions:   Active Problems:   ANXIETY DEPRESSION   Bipolar affective disorder, manic (HCC)   Fall   Closed fracture of multiple pubic rami, left, initial encounter ([place])   LOS: 2 days   How to contact the [facility] Attending or Consulting provider [place] or covering provider during after hours [place], for this patient?  1. Check the care team in [facility] and look for a) attending/consulting TRH provider listed and b) the [facility] team listed 2. Log into [email or web address] and use [place]'s universal password to access. If you do not have the password, please contact the hospital operator. 3. Locate the [facility] provider you are looking for under [facility] and page to a number that you can be directly reached. 4. If you still have difficulty reaching the provider, please page the [facility] (Director on Call) for the Hospitalists listed on amion for assistance.

## 2020-05-21 NOTE — Progress Notes (Signed)
Physical Therapy Treatment Patient Details Name: [name] MRN: [id number] DOB: 1946-07-03 Today's Date: 05/21/2020    History of Present Illness Pt admitted s/p fall and with pelvic fx.  Pt with hx of bipolar, lumbar fusion and Fibromyalgia    PT Comments    Pt continues cooperative but easily distracted.  Good progress with mobility this date with pt able to ambulate to hall with RW, increased time and assist of 2.   Follow Up Recommendations  SNF     Equipment Recommendations  None recommended by PT    Recommendations for Other Services OT consult     Precautions / Restrictions Precautions Precautions: Fall Restrictions Weight Bearing Restrictions: No RLE Weight Bearing: Weight bearing as tolerated LLE Weight Bearing: Weight bearing as tolerated Other Position/Activity Restrictions: WBAT    Mobility  Bed Mobility Overal bed mobility: Needs Assistance Bed Mobility: Supine to Sit     Supine to sit: Max assist;+2 for physical assistance     General bed mobility comments: Increased time with cues for sequence.  Physical assist to manage LEs and to control trunk.  Used bed pad to complete scooting to EOB.  Transfers Overall transfer level: Needs assistance Equipment used: Rolling walker (2 wheeled) Transfers: Sit to/from Stand Sit to Stand: Mod assist;Max assist;+2 physical assistance;From elevated surface         General transfer comment: cues for use of UEs to self assist and physical assist to bring wt up and fwd and to control descent  Ambulation/Gait Ambulation/Gait assistance: Mod assist;+2 physical assistance;+2 safety/equipment Gait Distance (Feet): 9 Feet Assistive device: Rolling walker (2 wheeled) Gait Pattern/deviations: Step-to pattern;Decreased step length - right;Decreased step length - left;Shuffle;Trunk flexed;Antalgic Gait velocity: decr   General Gait Details: step-by-step cues for sequence, posture, position from RW and increased UE  WB   Stairs             Wheelchair Mobility    Modified Rankin (Stroke Patients Only)       Balance Overall balance assessment: Needs assistance Sitting-balance support: Feet supported;Single extremity supported Sitting balance-Leahy Scale: Fair     Standing balance support: Bilateral upper extremity supported Standing balance-Leahy Scale: Poor                              Cognition Arousal/Alertness: Awake/alert Behavior During Therapy: Impulsive;Flat affect Overall Cognitive Status: Impaired/Different from baseline Area of Impairment: Memory;Following commands;Safety/judgement;Awareness;Problem solving                       Following Commands: Follows one step commands inconsistently Safety/Judgement: Decreased awareness of safety   Problem Solving: Requires verbal cues General Comments: requiring repeat functional commands, easily distracted.      Exercises      General Comments        Pertinent Vitals/Pain Pain Assessment: Faces Faces Pain Scale: Hurts even more Pain Location: L low back/buttock/hip Pain Descriptors / Indicators: Aching;Sore;Grimacing Pain Intervention(s): Limited activity within patient's tolerance;Monitored during session;Premedicated before session;Ice applied    Home Living                      Prior Function            PT Goals (current goals can now be found in the care plan section) Acute Rehab PT Goals Patient Stated Goal: Walk PT Goal Formulation: With patient Time For Goal Achievement: 06/02/20 Potential to Achieve Goals: Fair Progress  towards PT goals: Progressing toward goals    Frequency    Min 3X/week      PT Plan Current plan remains appropriate    Co-evaluation              AM-PAC PT "6 Clicks" Mobility   Outcome Measure  Help needed turning from your back to your side while in a flat bed without using bedrails?: A Lot Help needed moving from lying on your  back to sitting on the side of a flat bed without using bedrails?: A Lot Help needed moving to and from a bed to a chair (including a wheelchair)?: A Lot Help needed standing up from a chair using your arms (e.g., wheelchair or bedside chair)?: A Lot Help needed to walk in hospital room?: A Lot Help needed climbing 3-5 steps with a railing? : Total 6 Click Score: 11    End of Session Equipment Utilized During Treatment: Gait belt Activity Tolerance: Patient tolerated treatment well Patient left: in chair;with call bell/phone within reach Nurse Communication: Mobility status PT Visit Diagnosis: History of falling (Z91.81);Difficulty in walking, not elsewhere classified (R26.2);Pain Pain - Right/Left: Left Pain - part of body: Hip     Time: 1000-1025 PT Time Calculation (min) (ACUTE ONLY): 25 min  Charges:  $Gait Training: 8-22 mins $Therapeutic Activity: 8-22 mins                     Debe Coder PT Acute Rehabilitation Services Pager [phone] Office [phone]    Reveca Desmarais 05/21/2020, 12:43 PM

## 2020-05-21 NOTE — Plan of Care (Signed)
  Problem: Health Behavior/Discharge Planning: Goal: Ability to manage health-related needs will improve Outcome: Progressing   Problem: Clinical Measurements: Goal: Ability to maintain clinical measurements within normal limits will improve Outcome: Progressing   Problem: Clinical Measurements: Goal: Will remain free from infection Outcome: Progressing   Problem: Clinical Measurements: Goal: Diagnostic test results will improve Outcome: Progressing   Problem: Clinical Measurements: Goal: Respiratory complications will improve Outcome: Progressing   Problem: Activity: Goal: Risk for activity intolerance will decrease Outcome: Progressing   

## 2020-05-21 NOTE — Progress Notes (Signed)
Physical Therapy Treatment Patient Details Name: Magdalen Spiller MRN: [id number] DOB: [birth year] Today's Date: 05/21/2020    History of Present Illness Pt admitted s/p fall and with pelvic fx.  Pt with hx of bipolar, lumbar fusion and Fibromyalgia    PT Comments    Pt attempting to get out of chair repeatedly.  Assisted  Nursing staff  With peri-care and with transfer back to bed. Pt extremely restless today. Continue to recommend SNF post acute   Follow Up Recommendations  SNF     Equipment Recommendations  None recommended by PT    Recommendations for Other Services OT consult     Precautions / Restrictions Precautions Precautions: Fall Restrictions Weight Bearing Restrictions: No RLE Weight Bearing: Weight bearing as tolerated LLE Weight Bearing: Weight bearing as tolerated Other Position/Activity Restrictions: WBAT    Mobility  Bed Mobility Overal bed mobility: Needs Assistance Bed Mobility: Sit to Supine     Supine to sit: Max assist;+2 for physical assistance Sit to supine: Mod assist;+2 for physical assistance;+2 for safety/equipment;Min assist   General bed mobility comments: Increased time with cues for sequence.  Physical assist to manage LEs and to control trunk.  Used bed pad to complete lateral   Transfers Overall transfer level: Needs assistance Equipment used: Rolling walker (2 wheeled) Transfers: Sit to/from [facility] Sit to Stand: Mod assist;+2 safety/equipment Stand pivot transfers: Mod assist;+2 physical assistance;+2 safety/equipment       General transfer comment: cues for use of UEs to self assist and physical assist to bring wt up and fwd and to control descent; +2 to prevent knee buckling during stand pivot     Ambulation/Gait Ambulation/Gait assistance: Mod assist;+2 physical assistance;+2 safety/equipment Gait Distance (Feet): 9 Feet Assistive device: Rolling walker (2 wheeled) Gait Pattern/deviations: Step-to  pattern;Decreased step length - right;Decreased step length - left;Shuffle;Trunk flexed;Antalgic Gait velocity: decr   General Gait Details: stood for NT to assist with peri-care. cues to push on RW and remain upright.  pivotal steps back to bed   Stairs             Wheelchair Mobility    Modified Rankin (Stroke Patients Only)       Balance Overall balance assessment: Needs assistance Sitting-balance support: Feet supported;Single extremity supported Sitting balance-Leahy Scale: Fair     Standing balance support: Bilateral upper extremity supported Standing balance-Leahy Scale: Poor                              Cognition Arousal/Alertness: Awake/alert Behavior During Therapy: Impulsive;Restless Overall Cognitive Status: Impaired/Different from baseline Area of Impairment: Memory;Following commands;Safety/judgement;Awareness;Problem solving                       Following Commands: Follows one step commands inconsistently Safety/Judgement: Decreased awareness of safety;Decreased awareness of deficits   Problem Solving: Requires verbal cues General Comments: requiring repeat functional commands, easily distracted.      Exercises      General Comments        Pertinent Vitals/Pain Pain Assessment: Faces Faces Pain Scale: Hurts even more Pain Location: L low back/buttock/hip Pain Descriptors / Indicators: Aching;Sore;Grimacing Pain Intervention(s): Limited activity within patient's tolerance;Monitored during session;Repositioned    Home Living                      Prior Function            PT Goals (  current goals can now be found in the care plan section) Acute Rehab PT Goals Patient Stated Goal: Walk PT Goal Formulation: With patient Time For Goal Achievement: 06/02/20 Potential to Achieve Goals: Fair Progress towards PT goals: Progressing toward goals    Frequency    Min 3X/week      PT Plan Current plan  remains appropriate    Co-evaluation              AM-PAC PT "6 Clicks" Mobility   Outcome Measure  Help needed turning from your back to your side while in a flat bed without using bedrails?: A Lot Help needed moving from lying on your back to sitting on the side of a flat bed without using bedrails?: A Lot Help needed moving to and from a bed to a chair (including a wheelchair)?: A Lot Help needed standing up from a chair using your arms (e.g., wheelchair or bedside chair)?: A Lot Help needed to walk in hospital room?: A Lot Help needed climbing 3-5 steps with a railing? : Total 6 Click Score: 11    End of Session Equipment Utilized During Treatment: Gait belt Activity Tolerance: Patient tolerated treatment well;Patient limited by pain Patient left: with call bell/phone within reach;in bed;with bed alarm set Nurse Communication: Mobility status PT Visit Diagnosis: History of falling (Z91.81);Difficulty in walking, not elsewhere classified (R26.2);Pain Pain - Right/Left: Left Pain - part of body: Hip     Time: [id number] PT Time Calculation (min) (ACUTE ONLY): 17 min  Charges:  $Gait Training: 8-22 mins $Therapeutic Activity: 8-22 mins                     Mykaylah Ballman, PT   Acute Rehab Dept [facility]): [id number]   05/21/2020    [facility] 05/21/2020, 1:32 PM

## 2020-05-21 NOTE — Progress Notes (Signed)
Pt is agitated, screaming in the room, multiple attempts to come out of bed. Unable to redirect. MD is notified via secure chat. New orders received. Monitoring closely.

## 2020-05-21 NOTE — Progress Notes (Signed)
Subjective: Patient improving.  Still having pain with ambulation     Objective: Vital signs in last 24 hours: Temp:  [98.6 F (37 C)-99.9 F (37.7 C)] 98.6 F (37 C) (05/29 [id number]) Pulse Rate:  [87-105] 87 (05/29 0746) Resp:  [15-20] 16 (05/29 0746) BP: (122-156)/(77-127) 139/77 (05/29 0746) SpO2:  [92 %-100 %] 95 % (05/29 0746)  Intake/Output from previous day: 05/28 0701 - 05/29 0700 In: 840 [P.O.:840] Out: 1400 [Urine:1400] Intake/Output this shift: No intake/output data recorded.  Recent Labs    05/18/20 0914 05/19/20 0328 05/19/20 1130 05/19/20 1940 05/20/20 0335  HGB 13.8 11.8* 11.4* 11.4* 12.1   Recent Labs    05/19/20 1940 05/20/20 0335  WBC 8.7 9.6  RBC 3.47* 3.79*  HCT 34.0* 36.9  PLT 139* 149*   Recent Labs    05/19/20 0328 05/20/20 0335  NA 135 140  K 4.1 4.0  CL 99 101  CO2 26 28  BUN 13 9  CREATININE 0.76 0.72  GLUCOSE 130* 118*  CALCIUM 8.2* 8.7*   Recent Labs    05/18/20 0914  INR 1.0    ABD soft Neurovascular intact Sensation intact distally Intact pulses distally Compartment soft    Assessment/Plan: Active Problems:   ANXIETY DEPRESSION   Bipolar affective disorder, manic (HCC)   Fall   Closed fracture of multiple pubic rami, left, initial encounter [facility])   Awaiting insurance approval for SNF placement   Continue to encourage mobility with a walker   Theseus Birnie J Analy Bassford 05/21/2020, 8:15 AM

## 2020-05-21 NOTE — Progress Notes (Signed)
Patient is alert to self, confuse and hallucinating at times, becomes agitated this morning, calling 911, telling to 911 personnel over the phone that "they been holding me here and not letting me go home." "they're all telling lies" "they are not giving my medicine this morning". Security came up and talk to the pt and pt started to calm down. Provider on call paged and stated to keep pt reoriented and as long as pt not threatening somebody and not harming to her self will just keep on monitoring. Charge nurse aware and house supervisor made aware.

## 2020-05-22 DIAGNOSIS — F430 Acute stress reaction: Secondary | ICD-10-CM

## 2020-05-22 MED ORDER — HYDROCODONE-ACETAMINOPHEN 5-325 MG PO TABS
1.0000 | ORAL_TABLET | Freq: Four times a day (QID) | ORAL | Status: DC | PRN
  Administered 2020-05-23 – 2020-05-27 (×8): 1 via ORAL
  Filled 2020-05-22 (×8): qty 1

## 2020-05-22 MED ORDER — TRAMADOL HCL 50 MG PO TABS
50.0000 mg | ORAL_TABLET | Freq: Four times a day (QID) | ORAL | Status: DC | PRN
  Administered 2020-05-23 – 2020-05-26 (×6): 50 mg via ORAL
  Filled 2020-05-22 (×7): qty 1

## 2020-05-22 NOTE — Consult Note (Addendum)
[facility] Face-to-Face Psychiatry Consult   Reason for Consult:  ''Bipolar disorder,Acute confusion.'' Referring Physician:  Murray Hodgkins, MD Patient Identification: [name] MRN:  [id number] Principal Diagnosis: Acute stress reaction Diagnosis:  Principal Problem:   Acute stress reaction Active Problems:   ANXIETY DEPRESSION   Bipolar affective disorder, manic ([place])   Fall   Closed fracture of multiple pubic rami, left, initial encounter ([place])   Total Time spent with patient: 45 minutes  Subjective:   [name] is a 74 y.o. female patient admitted with hip pain  HPI: 74 year old woman who reports history of Bipolar disorder, Depression, fibromyalgia who was admitted due to left hip change. Patient reports that she has been stressed out due to being in the hospital for the past few days and would rather be at home right now. She also reports severe pain and want to be back on her medications for Bipolar and depression. She reports being confused yesterday thinking that her best friend in high school came to visit her in the hospital but now know that did not happen. Today, she appears fidgety,irritable, restless, anxious but alert and oriented to time, place and person. Patient states that "I am not crazy" just need to be back on all of her medications. She denies psychosis, delusions, depressions or self harming disorder.  Past Psychiatric History: as above  Risk to Self:  denies Risk to Others:  denies Prior Inpatient Therapy:  none reported recently. Prior Outpatient Therapy:    Past Medical History:  Past Medical History:  Diagnosis Date  . Allergy   . Anemia   . Anxiety   . Arthritis   . Astigmatism   . Bursitis   . Bursitis of hip   . Cancer ([place]) 10/2006   Invasive ductal carcinoma of the right breast   . Carpal tunnel syndrome   . Cataracts, bilateral   . Closed fracture of multiple pubic rami, left, initial encounter ([place])   . Colitis   . Depression   .  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 Endometriosis   . Fibromyalgia   . Gall stones   . GERD (gastroesophageal reflux disease)   . Hyperlipidemia   . Pneumonia   . Tendon tear     Past Surgical History:  Procedure Laterality Date  . APPENDECTOMY    . BREAST SURGERY Right 12/11/2006   Lumpectomy  . CARPAL TUNNEL RELEASE    . CYST EXCISION    . ELBOW SURGERY    . LUMBAR FUSION     Family History:  Family History  Problem Relation Age of Onset  . Cancer Mother   . Heart disease Mother   . Cancer Father        Colon cancer  . Heart disease Father   . Transient ischemic attack Father   . Heart disease Brother   . Heart disease Brother   . Vision loss Sister    Family Psychiatric  History:  Social History:  Social History   Substance and Sexual Activity  Alcohol Use No   Comment: AA since 1989     Social History   Substance and Sexual Activity  Drug Use No   Comment: stopped 1989    Social History   Socioeconomic History  . Marital status: Divorced    Spouse name: Not on file  . Number of children: Not on file  . Years of education: Not on file  . Highest education level: Not on file  Occupational History  .  Not on file  Tobacco Use  . Smoking status: Former Research scientist (life sciences)  . Smokeless tobacco: Never Used  . Tobacco comment: 3-4 packs per day x 30 years  Substance and Sexual Activity  . Alcohol use: No    Comment: AA since 1989  . Drug use: No    Comment: stopped 1989  . Sexual activity: Never    Birth control/protection: Post-menopausal  Other Topics Concern  . Not on file  Social History Narrative   Lives at home   Social Determinants of Health   Financial Resource Strain:   . Difficulty of Paying Living Expenses:   Food Insecurity:   . Worried About Charity fundraiser in the Last Year:   . Arboriculturist in the Last Year:   Transportation Needs:   . Film/video editor (Medical):   Marland Kitchen Lack of Transportation (Non-Medical):   Physical Activity:   .  Days of Exercise per Week:   . Minutes of Exercise per Session:   Stress:   . Feeling of Stress :   Social Connections:   . Frequency of Communication with Friends and Family:   . Frequency of Social Gatherings with Friends and Family:   . Attends Religious Services:   . Active Member of Clubs or Organizations:   . Attends Archivist Meetings:   Marland Kitchen Marital Status:    Additional Social History:    Allergies:   Allergies  Allergen Reactions  . Augmentin [Amoxicillin-Pot Clavulanate]   . Doxycycline   . Sulfa Drugs Cross Reactors   . Fentanyl And Related     Other reaction(s): Confusion    Labs: No results found for this or any previous visit (from the past 48 hour(s)).  Current Facility-Administered Medications  Medication Dose Route Frequency Provider Last Rate Last Admin  . acetaminophen (TYLENOL) tablet 650 mg  650 mg Oral Q6H PRN Terrilee Croak, MD   650 mg at 05/22/20 0956   Or  . acetaminophen (TYLENOL) suppository 650 mg  650 mg Rectal Q6H PRN Dahal, Binaya, MD      . albuterol (VENTOLIN HFA) 108 (90 Base) MCG/ACT inhaler 2 puff  2 puff Inhalation Q4H PRN Dahal, Binaya, MD      . benztropine (COGENTIN) tablet 0.5 mg  0.5 mg Oral QHS Dahal, Marlowe Aschoff, MD   0.5 mg at 05/21/20 2055  . buPROPion (WELLBUTRIN XL) 24 hr tablet 450 mg  450 mg Oral q AM Terrilee Croak, MD   450 mg at 05/22/20 0953  . divalproex (DEPAKOTE ER) 24 hr tablet 500 mg  500 mg Oral Daily Dahal, Binaya, MD   500 mg at 05/22/20 0956  . escitalopram (LEXAPRO) tablet 20 mg  20 mg Oral Daily Dahal, Marlowe Aschoff, MD   20 mg at 05/22/20 0956  . fluticasone (FLONASE) 50 MCG/ACT nasal spray 2 spray  2 spray Each Nare QHS Terrilee Croak, MD   2 spray at 05/20/20 2144  . LORazepam (ATIVAN) tablet 0.5 mg  0.5 mg Oral BID Terrilee Croak, MD   0.5 mg at 05/22/20 0957  . magnesium hydroxide (MILK OF MAGNESIA) suspension 30 mL  30 mL Oral Daily PRN Terrilee Croak, MD   30 mL at 05/19/20 2248  . mirtazapine (REMERON) tablet 45  mg  45 mg Oral QHS Terrilee Croak, MD   45 mg at 05/21/20 2055  . ondansetron (ZOFRAN) tablet 4 mg  4 mg Oral Q6H PRN Terrilee Croak, MD       Or  .  ondansetron (ZOFRAN) injection 4 mg  4 mg Intravenous Q6H PRN Dahal, Binaya, MD      . ondansetron (ZOFRAN) tablet 8 mg  8 mg Oral Q8H PRN Dahal, Binaya, MD      . pantoprazole (PROTONIX) EC tablet 40 mg  40 mg Oral Daily Dahal, Binaya, MD   40 mg at 05/22/20 0956  . QUEtiapine (SEROQUEL) tablet 150 mg  150 mg Oral QHS Samuella Cota, MD   150 mg at 05/21/20 2101  . senna (SENOKOT) tablet 8.6 mg  1 tablet Oral BID Terrilee Croak, MD   8.6 mg at 05/22/20 0957  . simvastatin (ZOCOR) tablet 20 mg  20 mg Oral QHS Terrilee Croak, MD   20 mg at 05/21/20 2054  . sodium chloride flush (NS) 0.9 % injection 3 mL  3 mL Intravenous Q12H Dahal, Marlowe Aschoff, MD   3 mL at 05/20/20 2144  . sorbitol 70 % solution 30 mL  30 mL Oral Daily PRN Dahal, Marlowe Aschoff, MD      . traMADol (ULTRAM) tablet 50 mg  50 mg Oral Q6H PRN Samuella Cota, MD   50 mg at 05/22/20 [id number]    Musculoskeletal: Strength & Muscle Tone: not tested Gait & Station: unable to stand Patient leans: N/A  Psychiatric Specialty Exam: Physical Exam  Psychiatric: Judgment normal. Her mood appears anxious. Her speech is rapid and/or pressured. She is agitated. Thought content is delusional. Cognition and memory are normal.    Review of Systems  Constitutional: Negative.   HENT: Negative.   Eyes: Negative.   Respiratory: Negative.   Cardiovascular: Negative.   Musculoskeletal: Positive for arthralgias.  Allergic/Immunologic: Negative.   Psychiatric/Behavioral: The patient is nervous/anxious.     Blood pressure (!) 142/89, pulse 90, temperature 98.1 F (36.7 C), temperature source Oral, resp. rate 16, height 5\' 5"  (1.651 m), weight 70 kg, SpO2 96 %.Body mass index is 25.68 kg/m.  General Appearance: Casual  Eye Contact:  Good  Speech:  Clear and Coherent  Volume:  Normal  Mood:  Anxious  Affect:   Congruent  Thought Process:  Coherent  Orientation:  Full (Time, Place, and Person)  Thought Content:  Logical  Suicidal Thoughts:  No  Homicidal Thoughts:  No  Memory:  Immediate;   Fair Recent;   Fair Remote;   Fair  Judgement:  Fair  Insight:  Fair  Psychomotor Activity:  Increased and Restlessness  Concentration:  Concentration: Fair and Attention Span: Fair  Recall:  [employer] of Knowledge:  Fair  Language:  Good  Akathisia:  No  Handed:  Right  AIMS (if indicated):     Assets:  Communication Skills Desire for Improvement  ADL's:  Intact  Cognition:  WNL  Sleep:        Treatment Plan Summary: 74 year old female with history of Bipolar disorder, depression who is currently admitted to the hospital due to left hip pain but developed increased anxiety, restlessness, confusion, delusions and irritability. Today, patient's symptoms are improving but will still benefit from medication adjustment.  Recommendations: -Consider discontinuation of Cogentin and Wellbutrin XL due to confusion, anxiety and irritability. -Continue  Seroquel  150 mg at bedtime for mood/delusions -Continue Mirtazapine, Lexapro and Depakote -Optimize pain management if possible. -Avoid Benzodiazepine to prevent worsening of confusion. -Refer patient to her outpatient psychiatrist upon discharge.     Disposition: No evidence of imminent risk to self or others at present.   Patient does not meet criteria for psychiatric inpatient admission.  Supportive therapy provided about ongoing stressors. Psychiatric service siging out. Re-consult as needed.  Corena Pilgrim, MD 05/22/2020 11:39 AM

## 2020-05-22 NOTE — Progress Notes (Addendum)
PROGRESS NOTE  [name] [id number] DOB: 1946-12-03 DOA: 05/18/2020 PCP: System, Pcp Not In  Brief History   74 year old woman PMH including bipolar disorder, fibromyalgia, presented after mechanical fall at home resulting in sharp shooting left groin pain.  Imaging showed fracture left superior pubic ramus.  Seen by orthopedics with recommendation for conservative management.  Seen by PT with recommendation for SNF.  PMH bipolar disorder, developed encephalopathy, confusion, hallucinations.  Seen by psychiatry with adjustment of medications.  Anticipate transfer to SNF when no longer needs a sitter for high fall risk.  A & P  Mechanical fall resulting in fracture left superior ramus fracture with associated pain --Continue recommendations as per orthopedics.  Weight-bear as tolerated. --Continue pain control and mobilization.  Follow-up with Dr. Mardelle Matte in 2 weeks. --Will need SNF.  Bipolar disorder, depression, acute confusion and encephalopathy.  On multiple psychotropics prior to admission including Cogentin, Wellbutrin, Depakote, Lexapro, Ativan, Remeron, Seroquel.  EKG with normal QTC. --Psychiatry has recommended discontinuing Cogentin and Wellbutrin, continuing Seroquel, mirtazapine, Lexapro and Depakote.  Avoid benzodiazepine. --We will hold Cogentin and Wellbutrin for now and assess response. --I discussed with ex-husband by telephone 5/29 who reported pt has had several episodes of psychiatric exacerbation while hospitalized for other illnesses requiring at times extensive post-acute psychiatric hospitalization.  In the past this seems to been triggered by perhaps some kind of medication or illness itself.  Symptoms this time are consistent with the past, with delusions and hallucinations. --stopped narcotic and replaced with tramadol which she takes regularly at home.   --Currently on holiday weekend. If persists into next week will reach out to the patient's  psychiatrist.  Fibromyalgia --continue Tramadol  Left knee pain.  Exam benign.  Reports she has had some pain for some time. --X-ray unrevealing.  Follow-up as an outpatient.  Disposition Plan:  Discussion: Continue treatment for pain for left superior ramus fracture.  Patient remains confused, impulsive, high fall risk, unable to care for self at home and currently requiring a safety sitter to prevent self-injury.  Status is: Inpatient  Dispo: The patient is from: Home              Anticipated d/c is to: SNF              Anticipated d/c date is: 2 days              Patient medically stable but confused  DVT prophylaxis: SCDs Code Status: Full Family Communication: Discussed in detail with asked husband.  Time greater than 35 minutes, greater than 50% counseling coordination of care.    Murray Hodgkins, MD  [facility] Direct contact: see [email or web address] (further directions at bottom of note if needed) 7PM-7AM contact night coverage as at bottom of note 05/22/2020, 4:23 PM  LOS: 3 days   Significant Hospital Events   . 4/26 admitted for pubic fracture with pain   Consults:  . Orthopedics . Psychiatry    Procedures:  .    Interval History/Subjective  Quite confused yesterday, attempting to get up, required a sitter.  Calling 911 at times.  Hallucinating at times.  Feels better today, though reports pain is 9/10, history unreliable.  Objective   Vitals:  Vitals:   05/22/20 0626 05/22/20 1333  BP: (!) 142/89 129/75  Pulse: 90 86  Resp: 16 18  Temp: 98.1 F (36.7 C) 98.7 F (37.1 C)  SpO2: 96% 95%    Exam:  Constitutional.  Appears calm and comfortable lying in bed.  Psychiatric.  Alert, speech fluent and clear, not always appropriate.  Confused.  Oriented to self, location, not month or year. Respiratory.  Clear to auscultation bilaterally.  No wheezes, rales or rhonchi.  Normal respiratory effort. Cardiovascular.  Regular rate and rhythm.  No murmur,  rub or gallop.  No lower extremity edema. Musculoskeletal.  Moves right leg with grossly normal tone and strength.  Left leg movement limited by pain.  Tone appears to be grossly normal.  I have personally reviewed the following:   Today's Data  . No new data  Scheduled Meds: . divalproex  500 mg Oral Daily  . escitalopram  20 mg Oral Daily  . fluticasone  2 spray Each Nare QHS  . LORazepam  0.5 mg Oral BID  . mirtazapine  45 mg Oral QHS  . pantoprazole  40 mg Oral Daily  . QUEtiapine  150 mg Oral QHS  . senna  1 tablet Oral BID  . simvastatin  20 mg Oral QHS  . sodium chloride flush  3 mL Intravenous Q12H   Continuous Infusions:   Principal Problem:   Acute stress reaction Active Problems:   ANXIETY DEPRESSION   Bipolar affective disorder, manic (HCC)   Fall   Closed fracture of multiple pubic rami, left, initial encounter ([place])   LOS: 3 days   How to contact the [facility] Attending or Consulting provider [place] or covering provider during after hours [place], for this patient?  1. Check the care team in [facility] and look for a) attending/consulting TRH provider listed and b) the [facility] team listed 2. Log into [email or web address] and use [place]'s universal password to access. If you do not have the password, please contact the hospital operator. 3. Locate the [facility] provider you are looking for under [facility] and page to a number that you can be directly reached. 4. If you still have difficulty reaching the provider, please page the [facility] (Director on Call) for the Hospitalists listed on amion for assistance.

## 2020-05-23 DIAGNOSIS — F430 Acute stress reaction: Secondary | ICD-10-CM

## 2020-05-23 DIAGNOSIS — F341 Dysthymic disorder: Secondary | ICD-10-CM

## 2020-05-23 DIAGNOSIS — S32592A Other specified fracture of left pubis, initial encounter for closed fracture: Secondary | ICD-10-CM

## 2020-05-23 MED ORDER — VITAMIN D (ERGOCALCIFEROL) 1.25 MG (50000 UNIT) PO CAPS
50000.0000 [IU] | ORAL_CAPSULE | ORAL | Status: DC
  Administered 2020-05-25: 50000 [IU] via ORAL
  Filled 2020-05-23: qty 1

## 2020-05-23 NOTE — Progress Notes (Signed)
Pt verbalized that she no longer want her ex husband Carmelia Roller to receive any information about her or her current health condition.

## 2020-05-23 NOTE — TOC Progression Note (Signed)
Transition of Care [facility]) - Progression Note    Patient Details  Name: [name] MRN: [id number] Date of Birth: Apr 27, 1946  [facility] CM/SW Contact  Lennart Pall, LCSW Phone Number: 05/23/2020, 12:44 PM  Clinical Narrative:   Still waiting Level 2 PASRR     Expected Discharge Plan: [place] Barriers to Discharge: Continued Medical Work up, Ship broker, Environmental education officer)  Expected Discharge Plan and Services Expected Discharge Plan: [place] In-house Referral: Clinical Social Work Discharge Planning Services: [employer] Consult Post Acute Care Choice: [place] Living arrangements for the past 2 months: Apartment                 DME Arranged: N/A DME Agency: NA       HH Arranged: NA HH Agency: NA         Social Determinants of Health (SDOH) Interventions    Readmission Risk Interventions No flowsheet data found.

## 2020-05-23 NOTE — Progress Notes (Signed)
PROGRESS NOTE  [name] [id number] DOB: Aug 22, 1946 DOA: 05/18/2020 PCP: System, Pcp Not In   LOS: 4 days   Brief narrative: As per HPI and previous provider, 74 year old woman PMH including bipolar disorder, fibromyalgia, presented after mechanical fall at home resulting in sharp shooting left groin pain.  Imaging showed fracture left superior pubic ramus.  Seen by orthopedics with recommendation for conservative management.  Seen by PT with recommendation for SNF.  PMH bipolar disorder, developed encephalopathy, confusion, hallucinations.  Seen by psychiatry with adjustment of medications.  Anticipate transfer to SNF when no longer needs a sitter for high fall risk.  Assessment/Plan:  Principal Problem:   Acute stress reaction Active Problems:   ANXIETY DEPRESSION   Bipolar affective disorder, manic (HCC)   Fall   Closed fracture of multiple pubic rami, left, initial encounter ([place])   Mechanical fall resulting in fracture left superior ramus fracture with associated pain --Weightbearing as tolerated.  Follow-up with orthopedics Dr. Orrin Brigham in 2 weeks.  Awaiting for skilled nursing facility soon at this time..    Bipolar disorder, depression, acute confusion and encephalopathy.  On multiple psychotropics prior to admission including Cogentin, Wellbutrin, Depakote, Lexapro, Ativan, Remeron, Seroquel.    Currently off Cogentin and Wellbutrin.  Avoiding benzodiazepines as per psychiatry.  Patient does have a history of significant exacerbation during hospitalization as per the patient's family with delusions and hallucinations.  On tramadol for pain relief.  Patient requesting so for home medication today.   Fibromyalgia --continue Tramadol  Left knee pain.    Chronic.  Physical exam unremarkable. x-ray of the knee without any acute findings.  VTE Prophylaxis: SCD  Code Status: Full code  Family Communication: None today  Status is: Inpatient  Remains inpatient  appropriate because:Altered mental status, Unsafe d/c plan and Waiting for skilled nursing facility   Dispo: The patient is from: Home              Anticipated d/c is to: SNF              Anticipated d/c date is: 2 days              Patient currently is medically stable but complains of     Consultants:  Orthopedics  Psychiatry  Procedures:  None  Antibiotics:  . None  Anti-infectives (From admission, onward)   None     Subjective: Today, patient was seen and examined at bedside.  Requesting for some of the home medication.  Objective: Vitals:   05/22/20 2027 05/23/20 0520  BP: 126/82 (!) 153/101  Pulse: 87 79  Resp: 16 16  Temp: 98.7 F (37.1 C) 98.3 F (36.8 C)  SpO2: 95% 96%    Intake/Output Summary (Last 24 hours) at 05/23/2020 0714 Last data filed at 05/23/2020 0618 Gross per 24 hour  Intake 1320 ml  Output 900 ml  Net 420 ml   Filed Weights   05/18/20 0854  Weight: 70 kg   Body mass index is 25.68 kg/m.   Physical Exam: GENERAL: Patient is alert awake and communicative.  Appears to be confused at times.Not in obvious distress. HENT: No scleral pallor or icterus. Pupils equally reactive to light. Oral mucosa is moist NECK: is supple, no gross swelling noted. CHEST: Clear to auscultation. No crackles or wheezes.  Diminished breath sounds bilaterally. CVS: S1 and S2 heard, no murmur. Regular rate and rhythm.  ABDOMEN: Soft, non-tender, bowel sounds are present. EXTREMITIES: No edema.  Moving extremities. CNS: Cranial nerves are  intact. No focal motor deficits. SKIN: warm and dry without rashes.  Data Review: I have personally reviewed the following laboratory data and studies,  CBC: Recent Labs  Lab 05/18/20 0914 05/19/20 0328 05/19/20 1130 05/19/20 1940 05/20/20 0335  WBC 7.4 6.5 7.3 8.7 9.6  NEUTROABS 5.2  --   --   --   --   HGB 13.8 11.8* 11.4* 11.4* 12.1  HCT 41.4 36.3 34.8* 34.0* 36.9  MCV 96.1 97.3 99.4 98.0 97.4  PLT 185 147*  132* 139* [id number]*   Basic Metabolic Panel: Recent Labs  Lab 05/18/20 0914 05/19/20 0328 05/20/20 0335  NA 138 135 140  K 4.6 4.1 4.0  CL 100 99 101  CO2 29 26 28   GLUCOSE 109* 130* 118*  BUN 15 13 9   CREATININE 0.73 0.76 0.72  CALCIUM 8.9 8.2* 8.7*   Liver Function Tests: Recent Labs  Lab 05/18/20 0914  AST 16  ALT 14  ALKPHOS 39  BILITOT 0.4  PROT 6.5  ALBUMIN 3.7   No results for input(s): LIPASE, AMYLASE in the last 168 hours. No results for input(s): AMMONIA in the last 168 hours. Cardiac Enzymes: No results for input(s): CKTOTAL, CKMB, CKMBINDEX, TROPONINI in the last 168 hours. BNP (last 3 results) No results for input(s): BNP in the last 8760 hours.  ProBNP (last 3 results) No results for input(s): PROBNP in the last 8760 hours.  CBG: No results for input(s): GLUCAP in the last 168 hours. Recent Results (from the past 240 hour(s))  SARS Coronavirus 2 by RT PCR (hospital order, performed in [facility] hospital lab) Nasopharyngeal Nasopharyngeal Swab     Status: None   Collection Time: 05/18/20 10:20 AM   Specimen: Nasopharyngeal Swab  Result Value Ref Range Status   SARS Coronavirus 2 NEGATIVE NEGATIVE Final    Comment: (NOTE) SARS-CoV-2 target nucleic acids are NOT DETECTED. The SARS-CoV-2 RNA is generally detectable in upper and lower respiratory specimens during the acute phase of infection. The lowest concentration of SARS-CoV-2 viral copies this assay can detect is 250 copies / mL. A negative result does not preclude SARS-CoV-2 infection and should not be used as the sole basis for treatment or other patient management decisions.  A negative result may occur with improper specimen collection / handling, submission of specimen other than nasopharyngeal swab, presence of viral mutation(s) within the areas targeted by this assay, and inadequate number of viral copies (<250 copies / mL). A negative result must be combined with clinical observations,  patient history, and epidemiological information. Fact Sheet for Patients:   StrictlyIdeas.no Fact Sheet for Healthcare Providers: BankingDealers.co.za This test is not yet approved or cleared  by the [place] FDA and has been authorized for detection and/or diagnosis of SARS-CoV-2 by FDA under an Emergency Use Authorization (EUA).  This EUA will remain in effect (meaning this test can be used) for the duration of the COVID-19 declaration under Section 564(b)(1) of the Act, 21 U.S.C. section 360bbb-3(b)(1), unless the authorization is terminated or revoked sooner. Performed at [facility], [place] [address] [place], [place] [address]      Studies: No results found.    Flora Lipps, MD  [facility] 05/23/2020

## 2020-05-24 LAB — URINALYSIS, ROUTINE W REFLEX MICROSCOPIC
Bilirubin Urine: NEGATIVE
Glucose, UA: NEGATIVE mg/dL
Ketones, ur: NEGATIVE mg/dL
Nitrite: POSITIVE — AB
Protein, ur: NEGATIVE mg/dL
Specific Gravity, Urine: 1.018 (ref 1.005–1.030)
WBC, UA: >50 WBC/hpf — ABNORMAL HIGH (ref 0–5)
pH: 6 (ref 5.0–8.0)

## 2020-05-24 NOTE — Progress Notes (Signed)
     Subjective:   Patient is alert, laying comfortably in bed. States pain is moderate this morning and worse in back. Denies chest pain, shortness of breath, abdominal pain, nausea or vomiting. States that she has not had a bowel movement in multiple days.   Objective:  PE: VITALS:   Vitals:   05/23/20 0520 05/23/20 1312 05/23/20 2142 05/24/20 0643  BP: (!) 153/101 (!) 142/84 (!) 148/73 129/87  Pulse: 79 87 84 87  Resp: 16 16 17 18   Temp: 98.3 F (36.8 C) 99 F (37.2 C) 98.9 F (37.2 C) 98.6 F (37 C)  TempSrc: Oral Oral Oral Oral  SpO2: 96% 97% 96% 93%  Weight:      Height:       General: Patient alert, oriented to person, year, and place. In no acute distress MSK: No TTP to right or left groin. No pain created with internal or external rotation of either hip. 0-50 degrees ROM of left knee, this created left hip pain. No L knee effusion. No TTP of left knee. EHL and FHL intact bilaterally. No calf swelling. Distal sensation intact. + DP pulses.   LABS  No results found for this or any previous visit (from the past 24 hour(s)).  No results found.  Assessment/Plan: Principal Problem:   Acute stress reaction Active Problems:   ANXIETY DEPRESSION   Bipolar affective disorder, manic (HCC)   Fall   Closed fracture of multiple pubic rami, left, initial encounter (HCC)  Left superior pubic ramus fracture - not indicated for surgery, will continue treating non-operatively  - Weightbearing: WBAT BLE, continue working with PT - VTE prophylaxis: SCD's - Pain control: tramadol, limit opioids - Follow - up plan: in 2 weeks with Dr. Mardelle Matte  Left knee pain: - patient today states she had arthroscopic knee surgery on L knee with Dr. Berenice Primas in the past, looks from chart review to have been a medial menisectomy - x-rays benign, exam benign  Low Back pain: - today patient states she has long history of low back pain - patient was unable to tolerate lumbar x-rays, CT was  performed which was negative for lumbar fracture with evidence of multilevel degenerative changes  Follow - up plan: in 2 weeks with Dr. Mardelle Matte Dispo: awaiting SNF placement  Contact information:   Weekdays 8-5 Merlene Pulling, PA-C [phone] A fter hours and holidays please check [email or web address] for group call information for [facility] [place] 05/24/2020, 10:25 AM

## 2020-05-24 NOTE — Plan of Care (Signed)
  Problem: Clinical Measurements: Goal: Respiratory complications will improve Outcome: Progressing   Problem: Activity: Goal: Risk for activity intolerance will decrease Outcome: Progressing   Problem: Nutrition: Goal: Adequate nutrition will be maintained Outcome: Progressing   Problem: Pain Managment: Goal: General experience of comfort will improve Outcome: Progressing   Problem: Skin Integrity: Goal: Risk for impaired skin integrity will decrease Outcome: Progressing   Problem: Education: Goal: Verbalization of understanding the information provided (i.e., activity precautions, restrictions, etc) will improve Outcome: Progressing

## 2020-05-24 NOTE — Progress Notes (Signed)
Physical Therapy Treatment Patient Details Name: [name] MRN: [id number] DOB: 05-May-1946 Today's Date: 05/24/2020    History of Present Illness Pt admitted s/p fall and with pelvic fx.  Pt with hx of bipolar, lumbar fusion and Fibromyalgia    PT Comments    Pt declines OOB however is agreeable to exercises. Pt was up to chair earlier with nursing staff. Pt is calm and coopertive, following one step commands without difficulty.  Pt tolerated LE ex's bil LEs, pain controlled during therapeutic exercises.    Follow Up Recommendations  SNF     Equipment Recommendations  None recommended by PT    Recommendations for Other Services       Precautions / Restrictions Precautions Precautions: Fall Restrictions Weight Bearing Restrictions: No Other Position/Activity Restrictions: WBAT    Mobility  Bed Mobility               General bed mobility comments: declined OOB( was up to chair earlier)  Transfers                    Ambulation/Gait                 Stairs             Wheelchair Mobility    Modified Rankin (Stroke Patients Only)       Balance                                            Cognition Arousal/Alertness: Awake/alert Behavior During Therapy: WFL for tasks assessed/performed Overall Cognitive Status: Impaired/Different from baseline Area of Impairment: Memory;Following commands;Safety/judgement;Awareness;Problem solving                     Memory: Decreased recall of precautions;Decreased short-term memory Following Commands: Follows one step commands consistently;Follows multi-step commands inconsistently Safety/Judgement: Decreased awareness of safety   Problem Solving: Requires verbal cues General Comments: tangential at times however improved attention to task      Exercises General Exercises - Lower Extremity Ankle Circles/Pumps: AROM;10 reps;Both Quad Sets: AROM;Both;10 reps Short Arc  Quad: AROM;Both;10 reps Heel Slides: AROM;AAROM;Both;10 reps Hip ABduction/ADduction: AROM;AAROM;Both;10 reps    General Comments        Pertinent Vitals/Pain Faces Pain Scale: Hurts a little bit Pain Location: L low back/buttock/hip Pain Descriptors / Indicators: Sore;Grimacing Pain Intervention(s): Limited activity within patient's tolerance;Monitored during session;Repositioned    Home Living                      Prior Function            PT Goals (current goals can now be found in the care plan section) Acute Rehab PT Goals Patient Stated Goal: Walk PT Goal Formulation: With patient Time For Goal Achievement: 06/02/20 Potential to Achieve Goals: Fair Progress towards PT goals: Progressing toward goals    Frequency    Min 3X/week      PT Plan Current plan remains appropriate    Co-evaluation              AM-PAC PT "6 Clicks" Mobility   Outcome Measure  Help needed turning from your back to your side while in a flat bed without using bedrails?: A Lot Help needed moving from lying on your back to sitting on the side of a flat bed without using bedrails?:  A Lot Help needed moving to and from a bed to a chair (including a wheelchair)?: A Lot Help needed standing up from a chair using your arms (e.g., wheelchair or bedside chair)?: A Lot Help needed to walk in hospital room?: A Lot Help needed climbing 3-5 steps with a railing? : Total 6 Click Score: 11    End of Session   Activity Tolerance: Patient tolerated treatment well Patient left: in bed;with call bell/phone within reach;with bed alarm set   PT Visit Diagnosis: History of falling (Z91.81);Difficulty in walking, not elsewhere classified (R26.2);Pain Pain - Right/Left: Left Pain - part of body: Hip     Time: 1400-1417 PT Time Calculation (min) (ACUTE ONLY): 17 min  Charges:  $Therapeutic Exercise: 8-22 mins                     Baxter Flattery, PT   Acute Rehab Dept [facility]):  [id number]   05/24/2020    [facility] 05/24/2020, 2:29 PM

## 2020-05-24 NOTE — Care Management Important Message (Signed)
Important Message  Patient Details IM Letter given to Lennart Pall SW Case Manager to present to the Patient Name: [name] MRN: [id number] Date of Birth: 11/02/1946   Medicare Important Message Given:  Yes     Kerin Salen 05/24/2020, 12:31 PM

## 2020-05-24 NOTE — Progress Notes (Signed)
PROGRESS NOTE  [name] [id number] DOB: April 11, 1946 DOA: 05/18/2020 PCP: System, Pcp Not In   LOS: 5 days   Brief narrative: As per HPI and previous provider,  74 year old woman PMH including bipolar disorder, fibromyalgia, presented after mechanical fall at home resulting in sharp shooting left groin pain.  Imaging showed fracture left superior pubic ramus.  Seen by orthopedics with recommendation for conservative management.  Seen by PT with recommendation for SNF.  PMH bipolar disorder, developed encephalopathy, confusion, hallucinations.  Seen by psychiatry with adjustment of medications.  Anticipate transfer to SNF.  Assessment/Plan:  Principal Problem:   Acute stress reaction Active Problems:   ANXIETY DEPRESSION   Bipolar affective disorder, manic (HCC)   Fall   Closed fracture of multiple pubic rami, left, initial encounter ([place])   Mechanical fall resulting in fracture left superior ramus fracture with associated pain --Weightbearing as tolerated.  Follow-up with orthopedics Dr. Orrin Brigham in 2 weeks.  Awaiting for skilled nursing facility at this time..    Bipolar disorder, depression, acute confusion and encephalopathy.  On multiple psychotropics prior to admission including Cogentin, Wellbutrin, Depakote, Lexapro, Ativan, Remeron, Seroquel.    Currently off Cogentin and Wellbutrin, has been discontinued as per psychiatry.  Seen by psychiatry this admission.Marland Kitchen  Avoiding benzodiazepines as per psychiatry.  Patient does have a history of significant exacerbation during hospitalization as per the patient's family with delusions and hallucinations.  On tramadol for pain relief.    Fibromyalgia --continue Tramadol  Left knee pain.    Chronic.  Physical exam unremarkable. x-ray of the knee without any acute findings.  VTE Prophylaxis: SCD  Code Status: Full code  Family Communication: None today  Status is: Inpatient  Remains inpatient appropriate because: Waiting for  skilled nursing facility placement.   Dispo: The patient is from: Home              Anticipated d/c is to: SNF              Anticipated d/c date is:1-2 days              Patient currently is medically stable for disposition.   Consultants:  Orthopedics  Psychiatry  Procedures:  None  Antibiotics:  . None  Anti-infectives (From admission, onward)   None     Subjective: Today, patient was seen and examined at bedside.  Patient complains of some bladder irritation and suprapubic discomfort.  Denies chills or rigor.  Objective: Vitals:   05/24/20 0643 05/24/20 1330  BP: 129/87 138/79  Pulse: 87 93  Resp: 18 16  Temp: 98.6 F (37 C) 98.4 F (36.9 C)  SpO2: 93% 98%    Intake/Output Summary (Last 24 hours) at 05/24/2020 1505 Last data filed at 05/24/2020 1100 Gross per 24 hour  Intake 1320 ml  Output 400 ml  Net 920 ml   Filed Weights   05/18/20 0854  Weight: 70 kg   Body mass index is 25.68 kg/m.   Physical Exam: GENERAL: Patient is alert awake and communicative. Not in obvious distress. HENT: No scleral pallor or icterus. Pupils equally reactive to light. Oral mucosa is moist NECK: is supple, no gross swelling noted. CHEST: Clear to auscultation. No crackles or wheezes.  Diminished breath sounds bilaterally. CVS: S1 and S2 heard, no murmur. Regular rate and rhythm.  ABDOMEN: Soft, non-tender, bowel sounds are present. EXTREMITIES: No edema.  Moving extremities. CNS: Cranial nerves are intact. No focal motor deficits. SKIN: warm and dry without rashes.  Data Review:  I have personally reviewed the following laboratory data and studies,  CBC: Recent Labs  Lab 05/18/20 0914 05/19/20 0328 05/19/20 1130 05/19/20 1940 05/20/20 0335  WBC 7.4 6.5 7.3 8.7 9.6  NEUTROABS 5.2  --   --   --   --   HGB 13.8 11.8* 11.4* 11.4* 12.1  HCT 41.4 36.3 34.8* 34.0* 36.9  MCV 96.1 97.3 99.4 98.0 97.4  PLT 185 147* 132* 139* [id number]*   Basic Metabolic Panel: Recent Labs    Lab 05/18/20 0914 05/19/20 0328 05/20/20 0335  NA 138 135 140  K 4.6 4.1 4.0  CL 100 99 101  CO2 29 26 28   GLUCOSE 109* 130* 118*  BUN 15 13 9   CREATININE 0.73 0.76 0.72  CALCIUM 8.9 8.2* 8.7*   Liver Function Tests: Recent Labs  Lab 05/18/20 0914  AST 16  ALT 14  ALKPHOS 39  BILITOT 0.4  PROT 6.5  ALBUMIN 3.7   No results for input(s): LIPASE, AMYLASE in the last 168 hours. No results for input(s): AMMONIA in the last 168 hours. Cardiac Enzymes: No results for input(s): CKTOTAL, CKMB, CKMBINDEX, TROPONINI in the last 168 hours. BNP (last 3 results) No results for input(s): BNP in the last 8760 hours.  ProBNP (last 3 results) No results for input(s): PROBNP in the last 8760 hours.  CBG: No results for input(s): GLUCAP in the last 168 hours. Recent Results (from the past 240 hour(s))  SARS Coronavirus 2 by RT PCR (hospital order, performed in [facility] hospital lab) Nasopharyngeal Nasopharyngeal Swab     Status: None   Collection Time: 05/18/20 10:20 AM   Specimen: Nasopharyngeal Swab  Result Value Ref Range Status   SARS Coronavirus 2 NEGATIVE NEGATIVE Final    Comment: (NOTE) SARS-CoV-2 target nucleic acids are NOT DETECTED. The SARS-CoV-2 RNA is generally detectable in upper and lower respiratory specimens during the acute phase of infection. The lowest concentration of SARS-CoV-2 viral copies this assay can detect is 250 copies / mL. A negative result does not preclude SARS-CoV-2 infection and should not be used as the sole basis for treatment or other patient management decisions.  A negative result may occur with improper specimen collection / handling, submission of specimen other than nasopharyngeal swab, presence of viral mutation(s) within the areas targeted by this assay, and inadequate number of viral copies (<250 copies / mL). A negative result must be combined with clinical observations, patient history, and epidemiological information. Fact  Sheet for Patients:   StrictlyIdeas.no Fact Sheet for Healthcare Providers: BankingDealers.co.za This test is not yet approved or cleared  by the [place] FDA and has been authorized for detection and/or diagnosis of SARS-CoV-2 by FDA under an Emergency Use Authorization (EUA).  This EUA will remain in effect (meaning this test can be used) for the duration of the COVID-19 declaration under Section 564(b)(1) of the Act, 21 U.S.C. section 360bbb-3(b)(1), unless the authorization is terminated or revoked sooner. Performed at [facility], [place] [address], [place], [place] [address]      Studies: No results found.    Flora Lipps, MD  [facility] 05/24/2020

## 2020-05-24 NOTE — Plan of Care (Signed)
  Problem: Clinical Measurements: Goal: Diagnostic test results will improve 05/24/2020 2204 by Elza Rafter, RN Outcome: Progressing 05/24/2020 2011 by Elza Rafter, RN Outcome: Progressing   Problem: Clinical Measurements: Goal: Respiratory complications will improve 05/24/2020 2204 by Elza Rafter, RN Outcome: Progressing 05/24/2020 2011 by Elza Rafter, RN Outcome: Progressing   Problem: Activity: Goal: Risk for activity intolerance will decrease 05/24/2020 2204 by Elza Rafter, RN Outcome: Progressing 05/24/2020 2011 by Elza Rafter, RN Outcome: Progressing   Problem: Nutrition: Goal: Adequate nutrition will be maintained 05/24/2020 2204 by Elza Rafter, RN Outcome: Progressing 05/24/2020 2011 by Elza Rafter, RN Outcome: Progressing   Problem: Coping: Goal: Level of anxiety will decrease 05/24/2020 2204 by Elza Rafter, RN Outcome: Progressing 05/24/2020 2011 by Elza Rafter, RN Outcome: Progressing   Problem: Safety: Goal: Ability to remain free from injury will improve 05/24/2020 2204 by Elza Rafter, RN Outcome: Progressing 05/24/2020 2011 by Elza Rafter, RN Outcome: Progressing   Problem: Skin Integrity: Goal: Risk for impaired skin integrity will decrease 05/24/2020 2204 by Elza Rafter, RN Outcome: Progressing 05/24/2020 2011 by Elza Rafter, RN Outcome: Progressing

## 2020-05-25 MED ORDER — SUCRALFATE 1 GM/10ML PO SUSP
1.0000 g | Freq: Three times a day (TID) | ORAL | Status: DC
  Administered 2020-05-25 – 2020-05-27 (×9): 1 g via ORAL
  Filled 2020-05-25 (×9): qty 10

## 2020-05-25 MED ORDER — CIPROFLOXACIN HCL 500 MG PO TABS
500.0000 mg | ORAL_TABLET | Freq: Two times a day (BID) | ORAL | Status: DC
  Administered 2020-05-25: 500 mg via ORAL
  Filled 2020-05-25 (×4): qty 1

## 2020-05-25 NOTE — Progress Notes (Addendum)
PROGRESS NOTE  [name] [id number] DOB: October 20, 1946 DOA: 05/18/2020 PCP: System, Pcp Not In   LOS: 6 days   Brief narrative: As per HPI and previous provider,  74 year old woman PMH including bipolar disorder, fibromyalgia, presented after mechanical fall at home resulting in sharp shooting left groin pain.  Imaging showed fracture left superior pubic ramus.  Seen by orthopedics with recommendation for conservative management.  Seen by PT with recommendation for SNF.  PMH bipolar disorder, developed encephalopathy, confusion, hallucinations.  Seen by psychiatry with adjustment of medications.  Anticipate transfer to SNF.  Assessment/Plan:  Principal Problem:   Acute stress reaction Active Problems:   ANXIETY DEPRESSION   Bipolar affective disorder, manic (HCC)   Fall   Closed fracture of multiple pubic rami, left, initial encounter ([place])   Mechanical fall resulting in fracture left superior ramus fracture with associated pain --Weightbearing as tolerated.  Follow-up with orthopedics Dr. Orrin Brigham in 2 weeks.  Awaiting for skilled nursing facility at this time.  Epigastric discomfort history of hiatal hernia.  Likely gastritis.  On Protonix.  Will continue.  Add sucralfate.  Patient was advised against carbonated drinks.  Dysuria, suprapubic pain.  Urinalysis with nitrite and plenty of white cells.  Because of symptoms, will treat the patient with oral ciprofloxacin.  Send urine for culture and sensitivity.  Afebrile.  Bipolar disorder, depression, acute confusion and encephalopathy.  On multiple psychotropics prior to admission including Cogentin, Wellbutrin, Depakote, Lexapro, Ativan, Remeron, Seroquel.    Currently off Cogentin and Wellbutrin, has been discontinued as per psychiatry.  Seen by psychiatry this admission.Marland Kitchen  Avoiding benzodiazepines as per psychiatry.  Patient does have a history of significant psychiatric exacerbation during hospitalization as per the patient's family  with delusions and hallucinations.  On tramadol for pain relief.  Will consider Tylenol for additional pain relief if necessary.  Fibromyalgia --continue Tramadol  Left knee pain.    Chronic.  Physical exam unremarkable. x-ray of the knee without any acute findings.  VTE Prophylaxis: SCD  Code Status: Full code  Family Communication: I spoke with the patient's sister Jackelyn Poling and updated her about the clinical condition of the patient.   Status is: Inpatient  Remains inpatient appropriate because: Waiting for skilled nursing facility placement.   Dispo: The patient is from: Home              Anticipated d/c is to: SNF              Anticipated d/c date is:1-2 days, awaiting for skilled nursing facility placement              Patient currently is medically stable for disposition.   Consultants:  Orthopedics  Psychiatry  Procedures:  None  Antibiotics:  . None  Anti-infectives (From admission, onward)   Start     Dose/Rate Route Frequency Ordered Stop   05/25/20 1000  ciprofloxacin (CIPRO) tablet 500 mg     500 mg Oral 2 times daily 05/25/20 0934       Subjective: Today, patient was seen and examined at bedside.  Complains of mild suprapubic and epigastric pain.  Denies any nausea, vomiting, fever or chills.  Objective: Vitals:   05/24/20 2012 05/25/20 0613  BP: (!) 142/77 126/80  Pulse: 84 74  Resp: 17 19  Temp: 99.5 F (37.5 C) 98.5 F (36.9 C)  SpO2: 95% 95%    Intake/Output Summary (Last 24 hours) at 05/25/2020 1111 Last data filed at 05/25/2020 0900 Gross per 24 hour  Intake  1200 ml  Output 600 ml  Net 600 ml   Filed Weights   05/18/20 0854  Weight: 70 kg   Body mass index is 25.68 kg/m.   Physical Exam:  GENERAL: Patient is alert awake and communicative. Not in obvious distress.  Oriented. HENT: No scleral pallor or icterus. Pupils equally reactive to light. Oral mucosa is moist NECK: is supple, no gross swelling noted. CHEST: Clear to  auscultation. No crackles or wheezes.  Diminished breath sounds bilaterally. CVS: S1 and S2 heard, no murmur. Regular rate and rhythm.  ABDOMEN: Soft, mild epigastric tenderness.  Bowel sounds are present. EXTREMITIES: No edema.  Moving extremities but with some painful restriction. CNS: Cranial nerves are intact. No focal motor deficits. SKIN: warm and dry without rashes.  Data Review: I have personally reviewed the following laboratory data and studies,  CBC: Recent Labs  Lab 05/19/20 0328 05/19/20 1130 05/19/20 1940 05/20/20 0335  WBC 6.5 7.3 8.7 9.6  HGB 11.8* 11.4* 11.4* 12.1  HCT 36.3 34.8* 34.0* 36.9  MCV 97.3 99.4 98.0 97.4  PLT 147* 132* 139* [id number]*   Basic Metabolic Panel: Recent Labs  Lab 05/19/20 0328 05/20/20 0335  NA 135 140  K 4.1 4.0  CL 99 101  CO2 26 28  GLUCOSE 130* 118*  BUN 13 9  CREATININE 0.76 0.72  CALCIUM 8.2* 8.7*   Liver Function Tests: No results for input(s): AST, ALT, ALKPHOS, BILITOT, PROT, ALBUMIN in the last 168 hours. No results for input(s): LIPASE, AMYLASE in the last 168 hours. No results for input(s): AMMONIA in the last 168 hours. Cardiac Enzymes: No results for input(s): CKTOTAL, CKMB, CKMBINDEX, TROPONINI in the last 168 hours. BNP (last 3 results) No results for input(s): BNP in the last 8760 hours.  ProBNP (last 3 results) No results for input(s): PROBNP in the last 8760 hours.  CBG: No results for input(s): GLUCAP in the last 168 hours. Recent Results (from the past 240 hour(s))  SARS Coronavirus 2 by RT PCR (hospital order, performed in [facility] hospital lab) Nasopharyngeal Nasopharyngeal Swab     Status: None   Collection Time: 05/18/20 10:20 AM   Specimen: Nasopharyngeal Swab  Result Value Ref Range Status   SARS Coronavirus 2 NEGATIVE NEGATIVE Final    Comment: (NOTE) SARS-CoV-2 target nucleic acids are NOT DETECTED. The SARS-CoV-2 RNA is generally detectable in upper and lower respiratory specimens during the  acute phase of infection. The lowest concentration of SARS-CoV-2 viral copies this assay can detect is 250 copies / mL. A negative result does not preclude SARS-CoV-2 infection and should not be used as the sole basis for treatment or other patient management decisions.  A negative result may occur with improper specimen collection / handling, submission of specimen other than nasopharyngeal swab, presence of viral mutation(s) within the areas targeted by this assay, and inadequate number of viral copies (<250 copies / mL). A negative result must be combined with clinical observations, patient history, and epidemiological information. Fact Sheet for Patients:   StrictlyIdeas.no Fact Sheet for Healthcare Providers: BankingDealers.co.za This test is not yet approved or cleared  by the [place] FDA and has been authorized for detection and/or diagnosis of SARS-CoV-2 by FDA under an Emergency Use Authorization (EUA).  This EUA will remain in effect (meaning this test can be used) for the duration of the COVID-19 declaration under Section 564(b)(1) of the Act, 21 U.S.C. section 360bbb-3(b)(1), unless the authorization is terminated or revoked sooner. Performed at [facility], [place] [address] [place], [place] [address]      Studies: No results found.  Flora Lipps, MD  [facility] 05/25/2020

## 2020-05-25 NOTE — Progress Notes (Signed)
     Subjective:    Patient is alert, sitting comfortably in bed. Reports pain to be moderate and located at left hip. Denies back pain or left knee pain this morning. States she had multiple bowel movement yesterday and feels less bloated.   Objective:  PE: VITALS:   Vitals:   05/24/20 0643 05/24/20 1330 05/24/20 2012 05/25/20 0613  BP: 129/87 138/79 (!) 142/77 126/80  Pulse: 87 93 84 74  Resp: 18 16 17 19   Temp: 98.6 F (37 C) 98.4 F (36.9 C) 99.5 F (37.5 C) 98.5 F (36.9 C)  TempSrc: Oral  Oral Oral  SpO2: 93% 98% 95% 95%  Weight:      Height:       General: Patient alert, in no acute distress MSK: Mild TTP to left side of pelvis, no TTP to right side. No pain created with internal or external rotation of either hip. 0-45 degrees AROM of left knee. No TTP of left knee. EHL and FHL intact bilaterally. No calf swelling. Distal sensation intact. + DP pulses.   LABS  Results for orders placed or performed during the hospital encounter of 05/18/20 (from the past 24 hour(s))  Urinalysis, Routine w reflex microscopic     Status: Abnormal   Collection Time: 05/24/20  4:09 PM  Result Value Ref Range   Color, Urine YELLOW YELLOW   APPearance CLOUDY (A) CLEAR   Specific Gravity, Urine 1.018 1.005 - 1.030   pH 6.0 5.0 - 8.0   Glucose, UA NEGATIVE NEGATIVE mg/dL   Hgb urine dipstick MODERATE (A) NEGATIVE   Bilirubin Urine NEGATIVE NEGATIVE   Ketones, ur NEGATIVE NEGATIVE mg/dL   Protein, ur NEGATIVE NEGATIVE mg/dL   Nitrite POSITIVE (A) NEGATIVE   Leukocytes,Ua LARGE (A) NEGATIVE   RBC / HPF 21-50 0 - 5 RBC/hpf   WBC, UA >50 (H) 0 - 5 WBC/hpf   Bacteria, UA MANY (A) NONE SEEN   WBC Clumps PRESENT    Mucus PRESENT    Non Squamous Epithelial 0-5 (A) NONE SEEN    No results found.  Assessment/Plan: Principal Problem:   Acute stress reaction Active Problems:   ANXIETY DEPRESSION   Bipolar affective disorder, manic (HCC)   Fall   Closed fracture of multiple pubic  rami, left, initial encounter ([place])  Left superior pubic ramus fracture - not indicated for surgery, will continue treating non-operatively  -Weightbearing:WBATBLE, continue working with PT -VTE prophylaxis:SCD's -Pain control:tramadol, limit opioids -Follow - up plan:in 2 weeks with Dr. Mardelle Matte  Left knee pain: - denies left knee pain today - x-rays benign, exam benign  Follow - up plan:in 2 weeks with Dr. Mardelle Matte Dispo: awaiting SNF placement  Contact information:   Weekdays 8-5 Merlene Pulling, PA-C [phone] A fter hours and holidays please check [email or web address] for group call information for [facility] [place]uns 05/25/2020, 9:42 AM

## 2020-05-26 LAB — URINE CULTURE: Culture: NO GROWTH

## 2020-05-26 LAB — BASIC METABOLIC PANEL
Anion gap: 13 (ref 5–15)
BUN: 22 mg/dL (ref 8–23)
CO2: 25 mmol/L (ref 22–32)
Calcium: 8.6 mg/dL — ABNORMAL LOW (ref 8.9–10.3)
Chloride: 100 mmol/L (ref 98–111)
Creatinine, Ser: 0.69 mg/dL (ref 0.44–1.00)
GFR calc Af Amer: >60 mL/min (ref >60)
GFR calc non Af Amer: >60 mL/min (ref >60)
Glucose, Bld: 114 mg/dL — ABNORMAL HIGH (ref 70–99)
Potassium: 4.1 mmol/L (ref 3.5–5.1)
Sodium: 138 mmol/L (ref 135–145)

## 2020-05-26 LAB — CBC
HCT: 34.7 % — ABNORMAL LOW (ref 36.0–46.0)
Hemoglobin: 11.3 g/dL — ABNORMAL LOW (ref 12.0–15.0)
MCH: 31.6 pg (ref 26.0–34.0)
MCHC: 32.6 g/dL (ref 30.0–36.0)
MCV: 96.9 fL (ref 80.0–100.0)
Platelets: 276 10*3/uL (ref 150–400)
RBC: 3.58 MIL/uL — ABNORMAL LOW (ref 3.87–5.11)
RDW: 12.6 % (ref 11.5–15.5)
WBC: 6.4 10*3/uL (ref 4.0–10.5)
nRBC: 0 % (ref 0.0–0.2)

## 2020-05-26 LAB — SARS CORONAVIRUS 2 (TAT 6-24 HRS): SARS Coronavirus 2: NEGATIVE

## 2020-05-26 NOTE — Progress Notes (Signed)
     Subjective: Patient laying comfortably in bed. Complains of moderate low back pain pain in pelvis on both right and left sides, left worse than right. Denies left knee pain today. Denies paraesthesias or pain radiating down legs.  Denies chest pain, shortness of breath, or calf pain. States she has some abdominal pain and bloating. No nausea or vomiting.    Objective:  PE: VITALS:   Vitals:   05/25/20 0613 05/25/20 1216 05/25/20 2022 05/26/20 0523  BP: 126/80 139/81 (!) 144/78 (!) 146/99  Pulse: 74 76 75 82  Resp: 19 18 18 18   Temp: 98.5 F (36.9 C) 98 F (36.7 C) 98.2 F (36.8 C) 98 F (36.7 C)  TempSrc: Oral  Oral Oral  SpO2: 95% 94% 96% 94%  Weight:      Height:       General: Patient alert, laying comfortably in bed, in no acute distress MSK: L anterior pelvis TTP, R anterior pelvis not TTP. No pain created with internal and external rotation of hips. AROM of left knee 0-45 degrees but this creates pain at pelvis. EHL and FHL intact bilaterally. Feet warm and well perfused. Distal sensation intact.    LABS  Results for orders placed or performed during the hospital encounter of 05/18/20 (from the past 24 hour(s))  CBC     Status: Abnormal   Collection Time: 05/26/20  4:05 AM  Result Value Ref Range   WBC 6.4 4.0 - 10.5 K/uL   RBC 3.58 (L) 3.87 - 5.11 MIL/uL   Hemoglobin 11.3 (L) 12.0 - 15.0 g/dL   HCT 34.7 (L) 36.0 - 46.0 %   MCV 96.9 80.0 - 100.0 fL   MCH 31.6 26.0 - 34.0 pg   MCHC 32.6 30.0 - 36.0 g/dL   RDW 12.6 11.5 - 15.5 %   Platelets 276 150 - 400 K/uL   nRBC 0.0 0.0 - 0.2 %  Basic metabolic panel     Status: Abnormal   Collection Time: 05/26/20  4:05 AM  Result Value Ref Range   Sodium 138 135 - 145 mmol/L   Potassium 4.1 3.5 - 5.1 mmol/L   Chloride 100 98 - 111 mmol/L   CO2 25 22 - 32 mmol/L   Glucose, Bld 114 (H) 70 - 99 mg/dL   BUN 22 8 - 23 mg/dL   Creatinine, Ser 0.69 0.44 - 1.00 mg/dL   Calcium 8.6 (L) 8.9 - 10.3 mg/dL   GFR calc non Af  Amer >60 >60 mL/min   GFR calc Af Amer >60 >60 mL/min   Anion gap 13 5 - 15    No results found.  Assessment/Plan: Principal Problem:   Acute stress reaction Active Problems:   ANXIETY DEPRESSION   Bipolar affective disorder, manic (HCC)   Fall   Closed fracture of multiple pubic rami, left, initial encounter ([place])  Left superior pubic ramus fracture - not indicated for surgery, willcontinuetreating non-operatively  -Weightbearing:WBATBLE, continue working with PT -VTE prophylaxis:SCD's -Pain control:tramadol  Low Back pain: - history of low back pain - lumber CT was performed which was negative for lumbar fracture with evidence of multilevel degenerative changes  Follow - up plan:in 2 weeks with Dr. Mardelle Matte Dispo: awaiting SNF placement  Contact information:   Weekdays 8-5 Merlene Pulling, PA-C [phone] A fter hours and holidays please check [email or web address] for group call information for [facility]  [place] Bruns 05/26/2020, 7:43 AM

## 2020-05-26 NOTE — Progress Notes (Signed)
PROGRESS NOTE  [name] [id number] DOB: Jun 01, 1946 DOA: 05/18/2020 PCP: System, Pcp Not In   LOS: 7 days   Brief narrative: As per HPI and previous provider,  74 year old woman PMH including bipolar disorder, fibromyalgia, presented after mechanical fall at home resulting in sharp shooting left groin pain.  Imaging showed fracture left superior pubic ramus.  Seen by orthopedics with recommendation for conservative management.  Seen by PT with recommendation for SNF.  PMH bipolar disorder, developed encephalopathy, confusion, hallucinations.  Seen by psychiatry with adjustment of medications.  This has improved at this time.  Currently awaiting for skilled nursing facility placement.   Assessment/Plan:  Principal Problem:   Acute stress reaction Active Problems:   ANXIETY DEPRESSION   Bipolar affective disorder, manic (HCC)   Fall   Closed fracture of multiple pubic rami, left, initial encounter ([place])   Mechanical fall resulting in fracture left superior ramus fracture with associated pain --Weightbearing as tolerated.  Follow-up with orthopedics Dr. Orrin Brigham in 2 weeks.  Awaiting for skilled nursing facility at this time.  Epigastric discomfort, history of hiatal hernia.  Likely gastritis.  Slightly improved today.  On Protonix. Added sucralfate.  Patient was advised against carbonated drinks.  Patient is trying to avoid antibiotic.  Dysuria, suprapubic pain.  Urinalysis with nitrite and plenty of white cells.  No IV access.  P.o. Cipro started yesterday but patient refused it. Sh states antibiotics make her abdomen hurt a lot. No symptoms today. Urine culture with no growth so far.  Bipolar disorder, depression, acute confusion and encephalopathy.  On multiple psychotropics prior to admission including Cogentin, Wellbutrin, Depakote, Lexapro, Ativan, Remeron, Seroquel.    Currently off Cogentin and Wellbutrin which has been discontinued as per psychiatry.  Seen by psychiatry this  admission.Marland Kitchen  Avoiding benzodiazepines as per psychiatry.  On tramadol and tylenol for pain relief.  Patient prefers her hydrocodone.  Will discontinue tramadol.  Fibromyalgia --continue Tramadol  Left knee pain.    Chronic.  Physical exam unremarkable. X-ray of the knee without any acute findings. Seen by orthopedics  VTE Prophylaxis: SCD  Code Status: Full code  Family Communication: None today.  Spoke with the patient's sister Jackelyn Poling on 05/25/2020  Status is: Inpatient  Remains inpatient appropriate because: Waiting for skilled nursing facility placement.   Dispo: The patient is from: Home              Anticipated d/c is to: SNF              Anticipated d/c date is: Likely tomorrow to skilled nursing facility as per transition of care.              Patient currently is medically stable for disposition.   Consultants:  Orthopedics  Psychiatry  Procedures:  None  Antibiotics:   None  Anti-infectives (From admission, onward)   Start     Dose/Rate Route Frequency Ordered Stop   05/25/20 1000  ciprofloxacin (CIPRO) tablet 500 mg     500 mg Oral 2 times daily 05/25/20 0934       Subjective: Today, patient was seen and examined at bedside.  Complains of perineal pain from recent fracture.  No dysuria urgency frequency nausea vomiting.   Objective: Vitals:   05/25/20 2022 05/26/20 0523  BP: (!) 144/78 (!) 146/99  Pulse: 75 82  Resp: 18 18  Temp: 98.2 F (36.8 C) 98 F (36.7 C)  SpO2: 96% 94%    Intake/Output Summary (Last 24 hours) at 05/26/2020 1244  Last data filed at 05/26/2020 0900 Gross per 24 hour  Intake 600 ml  Output 1600 ml  Net -1000 ml   Filed Weights   05/18/20 0854  Weight: 70 kg   Body mass index is 25.68 kg/m.   Physical Exam:  General:  Average built, not in obvious distress, communicative HENT:   No scleral pallor or icterus noted. Oral mucosa is moist.  Chest:  Clear breath sounds.  Diminished breath sounds bilaterally. No  crackles or wheezes.  CVS: S1 &S2 heard. No murmur.  Regular rate and rhythm. Abdomen: Soft, nontender, nondistended.  Bowel sounds are heard.   Extremities: No cyanosis, clubbing or edema.  Peripheral pulses are palpable.  Moves extremities. Psych: Alert, awake and oriented, normal mood CNS:  No cranial nerve deficits.  Power equal in all extremities.   Skin: Warm and dry.  No rashes noted. .  Data Review: I have personally reviewed the following laboratory data and studies,  CBC: Recent Labs  Lab 05/19/20 1940 05/20/20 0335 05/26/20 0405  WBC 8.7 9.6 6.4  HGB 11.4* 12.1 11.3*  HCT 34.0* 36.9 34.7*  MCV 98.0 97.4 96.9  PLT 139* 149* [id number]   Basic Metabolic Panel: Recent Labs  Lab 05/20/20 0335 05/26/20 0405  NA 140 138  K 4.0 4.1  CL 101 100  CO2 28 25  GLUCOSE 118* 114*  BUN 9 22  CREATININE 0.72 0.69  CALCIUM 8.7* 8.6*   Liver Function Tests: No results for input(s): AST, ALT, ALKPHOS, BILITOT, PROT, ALBUMIN in the last 168 hours. No results for input(s): LIPASE, AMYLASE in the last 168 hours. No results for input(s): AMMONIA in the last 168 hours. Cardiac Enzymes: No results for input(s): CKTOTAL, CKMB, CKMBINDEX, TROPONINI in the last 168 hours. BNP (last 3 results) No results for input(s): BNP in the last 8760 hours.  ProBNP (last 3 results) No results for input(s): PROBNP in the last 8760 hours.  CBG: No results for input(s): GLUCAP in the last 168 hours. Recent Results (from the past 240 hour(s))  SARS Coronavirus 2 by RT PCR (hospital order, performed in [facility] hospital lab) Nasopharyngeal Nasopharyngeal Swab     Status: None   Collection Time: 05/18/20 10:20 AM   Specimen: Nasopharyngeal Swab  Result Value Ref Range Status   SARS Coronavirus 2 NEGATIVE NEGATIVE Final    Comment: (NOTE) SARS-CoV-2 target nucleic acids are NOT DETECTED. The SARS-CoV-2 RNA is generally detectable in upper and lower respiratory specimens during the acute phase of  infection. The lowest concentration of SARS-CoV-2 viral copies this assay can detect is 250 copies / mL. A negative result does not preclude SARS-CoV-2 infection and should not be used as the sole basis for treatment or other patient management decisions.  A negative result may occur with improper specimen collection / handling, submission of specimen other than nasopharyngeal swab, presence of viral mutation(s) within the areas targeted by this assay, and inadequate number of viral copies (<250 copies / mL). A negative result must be combined with clinical observations, patient history, and epidemiological information. Fact Sheet for Patients:   StrictlyIdeas.no Fact Sheet for Healthcare Providers: BankingDealers.co.za This test is not yet approved or cleared  by the [place] FDA and has been authorized for detection and/or diagnosis of SARS-CoV-2 by FDA under an Emergency Use Authorization (EUA).  This EUA will remain in effect (meaning this test can be used) for the duration of the COVID-19 declaration under Section 564(b)(1) of the Act, 21 U.S.C. section  360bbb-3(b)(1), unless the authorization is terminated or revoked sooner. Performed at [facility], [place] [address] [place], [place] [address]   Culture, Urine     Status: None   Collection Time: 05/25/20  1:24 PM   Specimen: Urine, Clean Catch  Result Value Ref Range Status   Specimen Description   Final    URINE, CLEAN CATCH Performed at [facility], [place] [address], [place], [place] [address]    Special Requests   Final    NONE Performed at [facility], [place] [address] [place], [place] [address]    Culture   Final    NO GROWTH Performed at [facility], [place] [address], [place],  [address]    Report Status 05/26/2020 FINAL  Final     Studies: No results found.  Flora Lipps, MD  [facility] 05/26/2020

## 2020-05-26 NOTE — Progress Notes (Signed)
PT Cancellation Note  Patient Details Name: [name] MRN: [id number] DOB: 1946-01-31   Cancelled Treatment:     PT attempted but deferred at pt request 2* fatigue.  Pt states she has been up and down to [facility] all day with frequent BMs and is just so tired she couldn't stand up at this point.  Will follow in am.   [name] 05/26/2020, 3:40 PM

## 2020-05-26 NOTE — TOC Progression Note (Addendum)
Transition of Care [facility]) - Progression Note    Patient Details  Name: [name] MRN: [id number] Date of Birth: [birth year]  Transition of Care [facility]) CM/SW [place], [place] Phone Number: 05/26/2020, 1:19 PM  Clinical Narrative:    Patient reviewed SNF choices with her ex-spouse and chose [facility] [place] [facility].  Per Claiborne Billings in admissions, [employer] has waived PASRR.  Physician notified. Covid-19 test ordered.   D/C-6/4, Patient notified.     Expected Discharge Plan: Skilled Nursing Facility Barriers to Discharge: [place] [facility] G[facility]), Other-covid-19 test.   Expected Discharge Plan and Services Expected Discharge Plan: [place] In-house Referral: Clinical Social Work Discharge Planning Services: CM Consult Post Acute Care Choice: [place] arrangements for the past 2 months: Apartment                 DME Arranged: N/A DME Agency: NA       HH Arranged: NA HH Agency: NA         Social Determinants of Health (SDOH) Interventions    Readmission Risk Interventions No flowsheet data found.

## 2020-05-27 MED ORDER — HYDROCODONE-ACETAMINOPHEN 5-325 MG PO TABS: 1.0000 | ORAL_TABLET | Freq: Four times a day (QID) | ORAL | 0 refills | Status: DC | PRN

## 2020-05-27 MED ORDER — LIP MEDEX EX OINT: TOPICAL_OINTMENT | CUTANEOUS | Status: DC | PRN

## 2020-05-27 MED ORDER — SUCRALFATE 1 GM/10ML PO SUSP: 1.0000 g | Freq: Three times a day (TID) | ORAL | 0 refills | Status: DC

## 2020-05-27 MED ORDER — LORAZEPAM 0.5 MG PO TABS: 0.5000 mg | ORAL_TABLET | Freq: Two times a day (BID) | ORAL | 0 refills | Status: DC

## 2020-05-27 MED ORDER — SIMETHICONE 80 MG PO CHEW
80.0000 mg | CHEWABLE_TABLET | Freq: Four times a day (QID) | ORAL | Status: DC | PRN
Start: 2020-05-27 — End: 2024-09-17

## 2020-05-27 MED ORDER — ACETAMINOPHEN 325 MG PO TABS: 650.0000 mg | ORAL_TABLET | Freq: Four times a day (QID) | ORAL | Status: DC | PRN

## 2020-05-27 NOTE — TOC Transition Note (Addendum)
Transition of Care [facility]) - CM/SW Discharge Note   Patient Details  Name: [name] MRN: [id number] Date of Birth: 01/09/1946  [facility] of [facility]) CM/SW Contact:  Lia Hopping, [place] Phone Number: 05/27/2020, 9:54 AM   Clinical Narrative:    PASRR received [phone] F, End date 7/4 Nurse call report to: [phone] Room 109P [facility] to transport.    Final next level of care: Skilled Nursing Facility Barriers to Discharge: Barriers Resolved   Patient Goals and CMS Choice Patient states their goals for this hospitalization and ongoing recovery are:: go to rehab CMS Medicare.gov Compare Post Acute Care list provided to:: Patient Choice offered to / list presented to : Patient  Discharge Placement PASRR number recieved: 05/27/[phone])            Patient chooses bed at: [facility] Patient to be transferred to facility by: [place] Name of family member notified: Patient will notify her spouse Patient and family notified of of transfer: 05/27/20  Discharge Plan and Services In-house Referral: Clinical Social Work Discharge Planning Services: [employer] Consult Post Acute Care Choice: [place]          DME Arranged: N/A DME Agency: NA       HH Arranged: NA HH Agency: NA        Social Determinants of Health (SDOH) Interventions     Readmission Risk Interventions No flowsheet data found.

## 2020-05-27 NOTE — Progress Notes (Signed)
     Subjective: Patient alert, laying comfortably in bed. States pain is moderate in pelvis and low back this morning. No pain in left knee. Patient is eager to be discharged. States abdominal pain has improved.  Objective:  PE: VITALS:   Vitals:   05/26/20 1837 05/26/20 2153 05/27/20 0553 05/27/20 0826  BP: 136/87 116/84 (!) 150/82   Pulse: 74 76 87   Resp: 18 16 19    Temp: 98.2 F (36.8 C) 99.2 F (37.3 C) 98.3 F (36.8 C)   TempSrc:  Oral Oral   SpO2: 96% 91% 97% 95%  Weight:      Height:       General: Patient alert, laying comfortably in bed, in no acute distress MSK: L anterior pelvis TTP. No pain created with internal and external rotation of hips. AROM of left knee 0-40 degrees but this creates pain at pelvis. EHL and FHL intact bilaterally. Feet warm and well perfused. Distal sensation intact.    LABS  Results for orders placed or performed during the hospital encounter of 05/18/20 (from the past 24 hour(s))  SARS CORONAVIRUS 2 (TAT 6-24 HRS) Nasopharyngeal Nasopharyngeal Swab     Status: None   Collection Time: 05/26/20  2:03 PM   Specimen: Nasopharyngeal Swab  Result Value Ref Range   SARS Coronavirus 2 NEGATIVE NEGATIVE    No results found.  Assessment/Plan: Principal Problem:   Acute stress reaction Active Problems:   ANXIETY DEPRESSION   Bipolar affective disorder, manic (HCC)   Fall   Closed fracture of multiple pubic rami, left, initial encounter ([place])  Left superior pubic ramus fracture - willcontinuetreating non-operatively  -Weightbearing:WBATBLE -VTE prophylaxis:SCD's -Pain control:tramadol   Follow - up plan:in 2 weeks with Dr. Mardelle Matte Dispo: discharge to SNF today  Contact information:   Weekdays 8-5 Merlene Pulling, PA-C [phone] A fter hours and holidays please check [email or web address] for group call information for [facility] 05/27/2020, 8:56 AM

## 2020-05-27 NOTE — Care Management Important Message (Signed)
Important Message  Patient Details  Name: [name] MRN: [id number] Date of Birth: [birth year]   Medicare Important Message Given:  Yes  Document given to Kathrin Greathouse, LCSW  to give to the patient.   Crista Luria 05/27/2020, 10:22 AM

## 2020-05-27 NOTE — Progress Notes (Signed)
Report called and given to [facility] at [facility] [phone].

## 2020-05-27 NOTE — Progress Notes (Signed)
Physical Therapy Treatment Patient Details Name: [name] MRN: [id number] DOB: September 10, 1946 Today's Date: 05/27/2020    History of Present Illness Pt admitted s/p fall and with pelvic fx.  Pt with hx of bipolar, lumbar fusion and Fibromyalgia    PT Comments    Pt requires encouragement to participate but able to ambulate increased distance into hall this am.  Pt reports dc to SNF this date.   Follow Up Recommendations  SNF     Equipment Recommendations  None recommended by PT    Recommendations for Other Services OT consult     Precautions / Restrictions Precautions Precautions: Fall Restrictions Weight Bearing Restrictions: No RLE Weight Bearing: Weight bearing as tolerated LLE Weight Bearing: Weight bearing as tolerated    Mobility  Bed Mobility Overal bed mobility: Needs Assistance Bed Mobility: Supine to Sit     Supine to sit: Min assist;Mod assist;+2 for physical assistance;+2 for safety/equipment     General bed mobility comments: cues for sequence, assist to manage L LE and to bring trunk to upright  Transfers Overall transfer level: Needs assistance Equipment used: Rolling walker (2 wheeled) Transfers: Sit to/from Stand Sit to Stand: Min assist;Mod assist;+2 physical assistance;+2 safety/equipment;From elevated surface         General transfer comment: cues for use of UEs to self assist and physical assist to bring wt up and fwd and to control descent  Ambulation/Gait Ambulation/Gait assistance: Min assist;Mod assist;+2 safety/equipment Gait Distance (Feet): 18 Feet Assistive device: Rolling walker (2 wheeled) Gait Pattern/deviations: Step-to pattern;Decreased step length - right;Decreased step length - left;Shuffle;Trunk flexed;Antalgic Gait velocity: decr   General Gait Details: cues for posture, sequence, position from RW and increased UE WB   Stairs             Wheelchair Mobility    Modified Rankin (Stroke Patients Only)        Balance Overall balance assessment: Needs assistance Sitting-balance support: Feet supported;Single extremity supported Sitting balance-Leahy Scale: Fair     Standing balance support: Bilateral upper extremity supported Standing balance-Leahy Scale: Poor                              Cognition Arousal/Alertness: Awake/alert Behavior During Therapy: WFL for tasks assessed/performed Overall Cognitive Status: Impaired/Different from baseline Area of Impairment: Memory;Following commands;Safety/judgement;Awareness;Problem solving                     Memory: Decreased recall of precautions;Decreased short-term memory Following Commands: Follows one step commands consistently;Follows multi-step commands inconsistently Safety/Judgement: Decreased awareness of safety   Problem Solving: Requires verbal cues General Comments: tangential at times however improved attention to task      Exercises      General Comments        Pertinent Vitals/Pain Pain Assessment: Faces Faces Pain Scale: Hurts little more Pain Location: L low back/buttock/hip Pain Descriptors / Indicators: Sore;Grimacing Pain Intervention(s): Limited activity within patient's tolerance;Monitored during session;Premedicated before session    Home Living                      Prior Function            PT Goals (current goals can now be found in the care plan section) Acute Rehab PT Goals Patient Stated Goal: Walk PT Goal Formulation: With patient Time For Goal Achievement: 06/02/20 Potential to Achieve Goals: Fair Progress towards PT goals: Progressing toward goals  Frequency    Min 3X/week      PT Plan Current plan remains appropriate    Co-evaluation              AM-PAC PT "6 Clicks" Mobility   Outcome Measure  Help needed turning from your back to your side while in a flat bed without using bedrails?: A Lot Help needed moving from lying on your back to  sitting on the side of a flat bed without using bedrails?: A Lot Help needed moving to and from a bed to a chair (including a wheelchair)?: A Lot Help needed standing up from a chair using your arms (e.g., wheelchair or bedside chair)?: A Lot Help needed to walk in hospital room?: A Lot Help needed climbing 3-5 steps with a railing? : Total 6 Click Score: 11    End of Session Equipment Utilized During Treatment: Gait belt Activity Tolerance: Patient tolerated treatment well;Patient limited by pain;Patient limited by fatigue Patient left: in chair;with call bell/phone within reach;with chair alarm set Nurse Communication: Mobility status PT Visit Diagnosis: History of falling (Z91.81);Difficulty in walking, not elsewhere classified (R26.2);Pain Pain - Right/Left: Left Pain - part of body: Hip     Time: 3709-6438 PT Time Calculation (min) (ACUTE ONLY): 14 min  Charges:  $Gait Training: 8-22 mins                     [place] Pager [phone] Office [phone]    Finnley Lewis 05/27/2020, 12:48 PM

## 2020-05-27 NOTE — Discharge Summary (Signed)
Physician Discharge Summary  [name] QPR:[id number] DOB: 1946-11-06 DOA: 05/18/2020  PCP: System, Pcp Not In  Admit date: 05/18/2020 Discharge date: 05/27/2020  Admitted From: Home  Discharge disposition: SNF   Recommendations for Outpatient Follow-Up:   . Follow up with your primary care provider at the skilled nursing facility in 3 to 5 days . Check CBC, BMP, Mg in the next visit. . Follow-up with orthopedics Dr Mardelle Matte in 2 weeks. . Patient used to take tramadol but she did not want it anymore so has been changed to hydrocodone. . Patient has refused to take any antibiotic for UTI.  If she develops symptoms please consider antibiotic. .  Currently off Cogentin and Wellbutrin which has been discontinued as per psychiatry.  Avoiding benzodiazepines as per psychiatry.   Discharge Diagnosis:   Principal Problem:   Acute stress reaction Active Problems:   ANXIETY DEPRESSION   Bipolar affective disorder, manic ([place])   Fall   Closed fracture of multiple pubic rami, left, initial encounter [facility])   Discharge Condition: Improved.  Diet recommendation:   Regular.  Wound care: None.  Code status: Full.   History of Present Illness:  74 year old woman PMH including bipolar disorder, fibromyalgia, presented after mechanical fall at home resulting in sharp shooting left groin pain. Imaging showed fracture left superior pubic ramus.Seen by orthopedics with recommendation for conservative management.  PMH bipolar disorder, developed encephalopathy, confusion, hallucinations. Seen by psychiatry with adjustment of medications.  This has improved at this time.Seen by PT with recommendation for SNF.  Hospital Course:   Following conditions were addressed during hospitalization as listed below,  Mechanical fall resulting in fracture left superior ramus fracture with associated pain --Weightbearing as tolerated.  Follow-up with orthopedics Dr. Mardelle Matte in 2 weeks. Plan for skilled  nursing facility for rehab at this time.  Epigastric discomfort, history of hiatal hernia.  Likely gastritis. .  On Protonix. Will continue sucralfate.  Patient was advised against carbonated drinks.   Dysuria, suprapubic pain.    Improved.  Urinalysis with nitrite and plenty of white cells.  prescribed Cipro but patient refused it. She states antibiotics make her abdomen hurt a lot. No symptoms today. Urine culture with no growth so far.  Bipolar disorder, depression, acute confusion and encephalopathy. On multiple psychotropicsprior to admissionincluding Cogentin, Wellbutrin, Depakote, Lexapro, Ativan, Remeron, Seroquel.   Currently off Cogentin and Wellbutrin which has been discontinued as per psychiatry.  Seen by psychiatry this admission.Marland Kitchen  Avoiding benzodiazepines as per psychiatry.  On tramadol and tylenol for pain relief.  Patient prefers hydrocodone.  Will discontinue tramadol and put on hydrocodone on discharge..  Fibromyalgia We will discontinuetramadol.  Continue hydrocodone  Left knee pain.   Chronic.  Physical exam unremarkable. X-ray of the knee without any acute findings. Seen by orthopedics.  Follow-up with orthopedics in 2 weeks.  Disposition.  At this time, patient is stable for disposition to skilled nursing facility.    Medical Consultants:    Orthopedics  Psychiatry  Procedures:    None Subjective:   Today, patient feels better.  No dysuria urgency frequency.  Mild pubic area pain.  Discharge Exam:   Vitals:   05/27/20 0553 05/27/20 0826  BP: (!) 150/82   Pulse: 87   Resp: 19   Temp: 98.3 F (36.8 C)   SpO2: 97% 95%   Vitals:   05/26/20 1837 05/26/20 2153 05/27/20 0553 05/27/20 0826  BP: 136/87 116/84 (!) 150/82   Pulse: 74 76 87   Resp: 18  16 19   Temp: 98.2 F (36.8 C) 99.2 F (37.3 C) 98.3 F (36.8 C)   TempSrc:  Oral Oral   SpO2: 96% 91% 97% 95%  Weight:      Height:       General: Alert awake, not in obvious  distress HENT: pupils equally reacting to light,  No scleral pallor or icterus noted. Oral mucosa is moist.  Chest:  Clear breath sounds.  Diminished breath sounds bilaterally. No crackles or wheezes.  CVS: S1 &S2 heard. No murmur.  Regular rate and rhythm. Abdomen: Soft, nontender, nondistended.  Bowel sounds are heard.   Extremities: No cyanosis, clubbing or edema.  Peripheral pulses are palpable. Psych: Alert, awake and oriented, normal mood CNS:  No cranial nerve deficits.  Power equal in all extremities.   Skin: Warm and dry.  No rashes noted.  The results of significant diagnostics from this hospitalization (including imaging, microbiology, ancillary and laboratory) are listed below for reference.     Diagnostic Studies:   DG Lumbar Spine 2-3 Views  Result Date: 05/19/2020 CLINICAL DATA:  Pain EXAM: LUMBAR SPINE - 2-3 VIEW COMPARISON:  04/04/2017 FINDINGS: Exam is severely limited by lack of additional views. There is no definite acute displaced fracture. However, detection of fractures is severely limited by single view technique. There is a large amount of stool in the colon. Again noted is a fracture of the superior pubic ramus on the left. There are questionable fractures of the bilateral sacral ala. IMPRESSION: 1. The patient declined additional views. Exam is severely limited by single view technique. Recommend short interval repeat exam with possible. 2. Again noted is an acute fracture of the left superior pubic ramus. 3. Findings suspicious for bilateral sacral ala fractures. 4. Large stool burden Electronically Signed   By: Constance Holster M.D.   On: 05/19/2020 19:28   DG Knee 1-2 Views Left  Result Date: 05/19/2020 CLINICAL DATA:  Pain following fall EXAM: LEFT KNEE - 1-2 VIEW COMPARISON:  December 28, 2010 FINDINGS: Frontal and lateral views obtained. No fracture or dislocation. There is a small joint effusion. There is slight joint space narrowing medially. Other joint spaces  appear unremarkable. There is a spur along the anterior superior patella. IMPRESSION: No fracture or dislocation. Small joint effusion. Mild narrowing medially. Spur along the anterior superior patella likely represents distal quadriceps tendinosis. Electronically Signed   By: Lowella Grip III M.D.   On: 05/19/2020 11:49   DG Chest Port 1 View  Result Date: 05/18/2020 CLINICAL DATA:  Pain following fall EXAM: PORTABLE CHEST 1 VIEW COMPARISON:  March 30, 2019 FINDINGS: Lungs are clear. Heart size and pulmonary vascularity are normal. No adenopathy. No pneumothorax. There is aortic atherosclerosis. No bone lesions. IMPRESSION: Lungs clear. Cardiac silhouette normal. Aortic Atherosclerosis (ICD10-I70.0). Electronically Signed   By: Lowella Grip III M.D.   On: 05/18/2020 10:10   DG HIP UNILAT WITH PELVIS 2-3 VIEWS LEFT  Result Date: 05/18/2020 CLINICAL DATA:  Pain following fall EXAM: DG HIP (WITH OR WITHOUT PELVIS) 2-3V LEFT COMPARISON:  MR pelvis February 19, 2015 FINDINGS: Frontal pelvis as well as frontal and lateral left hip images were obtained. There is an apparent fracture through the midportion of the left superior pubic ramus. Alignment near anatomic in this area. No other fracture is appreciable. No dislocation. There is mild symmetric narrowing of each hip joint. 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 IMPRESSION: 1. Fracture left superior pubic ramus with alignment near anatomic. No other fracture demonstrable. No dislocation. 2.  Mild symmetric narrowing of each hip joint. 3. 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 Electronically Signed   By: Lowella Grip III M.D.   On: 05/18/2020 10:15     Labs:   Basic Metabolic Panel: Recent Labs  Lab 05/26/20 0405  NA 138  K 4.1  CL 100  CO2 25  GLUCOSE 114*  BUN 22   CREATININE 0.69  CALCIUM 8.6*   GFR Estimated Creatinine Clearance: 60.6 mL/min (by C-G formula based on SCr of 0.69 mg/dL). Liver Function Tests: No results for input(s): AST, ALT, ALKPHOS, BILITOT, PROT, ALBUMIN in the last 168 hours. No results for input(s): LIPASE, AMYLASE in the last 168 hours. No results for input(s): AMMONIA in the last 168 hours. Coagulation profile No results for input(s): INR, PROTIME in the last 168 hours.  CBC: Recent Labs  Lab 05/26/20 0405  WBC 6.4  HGB 11.3*  HCT 34.7*  MCV 96.9  PLT 276   Cardiac Enzymes: No results for input(s): CKTOTAL, CKMB, CKMBINDEX, TROPONINI in the last 168 hours. BNP: Invalid input(s): POCBNP CBG: No results for input(s): GLUCAP in the last 168 hours. D-Dimer No results for input(s): DDIMER in the last 72 hours. Hgb A1c No results for input(s): HGBA1C in the last 72 hours. Lipid Profile No results for input(s): CHOL, HDL, LDLCALC, TRIG, CHOLHDL, LDLDIRECT in the last 72 hours. Thyroid function studies No results for input(s): TSH, T4TOTAL, T3FREE, THYROIDAB in the last 72 hours.  Invalid input(s): FREET3 Anemia work up No results for input(s): VITAMINB12, FOLATE, FERRITIN, TIBC, IRON, RETICCTPCT in the last 72 hours. Microbiology Recent Results (from the past 240 hour(s))  SARS Coronavirus 2 by RT PCR (hospital order, performed in [facility] hospital lab) Nasopharyngeal Nasopharyngeal Swab     Status: None   Collection Time: 05/18/20 10:20 AM   Specimen: Nasopharyngeal Swab  Result Value Ref Range Status   SARS Coronavirus 2 NEGATIVE NEGATIVE Final    Comment: (NOTE) SARS-CoV-2 target nucleic acids are NOT DETECTED. The SARS-CoV-2 RNA is generally detectable in upper and lower respiratory specimens during the acute phase of infection. The lowest concentration of SARS-CoV-2 viral copies this assay can detect is 250 copies / mL. A negative result does not preclude SARS-CoV-2 infection and should not be  used as the sole basis for treatment or other patient management decisions.  A negative result may occur with improper specimen collection / handling, submission of specimen other than nasopharyngeal swab, presence of viral mutation(s) within the areas targeted by this assay, and inadequate number of viral copies (<250 copies / mL). A negative result must be combined with clinical observations, patient history, and epidemiological information. Fact Sheet for Patients:   StrictlyIdeas.no Fact Sheet for Healthcare Providers: BankingDealers.co.za This test is not yet approved or cleared  by the [place] FDA and has been authorized for detection and/or diagnosis of SARS-CoV-2 by FDA under an Emergency Use Authorization (EUA).  This EUA will remain in effect (meaning this test can be used) for the duration of the COVID-19 declaration under Section 564(b)(1) of the Act, 21 U.S.C. section 360bbb-3(b)(1), unless the authorization is terminated or revoked sooner. Performed at [facility], [place] [address], [place], [place] [address]   Culture, Urine     Status: None   Collection Time: 05/25/20  1:24 PM  Specimen: Urine, Clean Catch  Result Value Ref Range Status   Specimen Description   Final    URINE, CLEAN CATCH Performed at [facility], [place] [address] [place], [place] [address]    Special Requests   Final    NONE Performed at [facility], [place] [address], [place], [place] [address]    Culture   Final    NO GROWTH Performed at [facility], [place] [address], [place], [place] [address]    Report Status 05/26/2020 FINAL  Final  SARS CORONAVIRUS 2 (TAT 6-24 HRS) Nasopharyngeal Nasopharyngeal Swab     Status: None   Collection Time: 05/26/20  2:03 PM   Specimen: Nasopharyngeal Swab  Result Value Ref Range Status   SARS Coronavirus 2 NEGATIVE NEGATIVE Final    Comment:  (NOTE) SARS-CoV-2 target nucleic acids are NOT DETECTED. The SARS-CoV-2 RNA is generally detectable in upper and lower respiratory specimens during the acute phase of infection. Negative results do not preclude SARS-CoV-2 infection, do not rule out co-infections with other pathogens, and should not be used as the sole basis for treatment or other patient management decisions. Negative results must be combined with clinical observations, patient history, and epidemiological information. The expected result is Negative. Fact Sheet for Patients: SugarRoll.be Fact Sheet for Healthcare Providers: https://www.woods-mathews.com/ This test is not yet approved or cleared by the [place] FDA and  has been authorized for detection and/or diagnosis of SARS-CoV-2 by FDA under an Emergency Use Authorization (EUA). This EUA will remain  in effect (meaning this test can be used) for the duration of the COVID-19 declaration under Section 56 4(b)(1) of the Act, 21 U.S.C. section 360bbb-3(b)(1), unless the authorization is terminated or revoked sooner. Performed at [facility], [place] [address], [place], [place] [address]      Discharge Instructions:   Discharge Instructions    Diet - low sodium heart healthy   Complete by: As directed    Discharge instructions   Complete by: As directed    Follow up with your primary care provider at SNF in 3-5 days. Follow up with Dr Mardelle Matte orthopedics in 2 weeks. Weight bearing as tolerated.   Increase activity slowly   Complete by: As directed      Allergies as of 05/27/2020      Reactions   Augmentin [amoxicillin-pot Clavulanate]    Doxycycline    Sulfa Drugs Cross Reactors    Fentanyl And Related    Other reaction(s): Confusion      Medication List    STOP taking these medications   benztropine 1 MG tablet Commonly known as: COGENTIN   buPROPion 150 MG 24 hr tablet Commonly known as: WELLBUTRIN  XL   traMADol 50 MG tablet Commonly known as: ULTRAM     TAKE these medications   acetaminophen 325 MG tablet Commonly known as: TYLENOL Take 2 tablets (650 mg total) by mouth every 6 (six) hours as needed for mild pain (or Fever >/= 101).   albuterol 108 (90 Base) MCG/ACT inhaler Commonly known as: ProAir HFA Inhale 2 puffs into the lungs every 4 (four) hours as needed for wheezing or shortness of breath. What changed: when to take this   benzonatate 100 MG capsule Commonly known as: TESSALON Take 1-2 capsules (100-200 mg total) by mouth 3 (three) times daily as needed for cough.   cetirizine 10 MG tablet Commonly known as: ZYRTEC Take 10 mg by mouth daily.   divalproex  500 MG 24 hr tablet Commonly known as: DEPAKOTE ER Take 500 mg by mouth daily.   escitalopram 20 MG tablet Commonly known as: LEXAPRO Take 20 mg by mouth daily.   fluticasone 50 MCG/ACT nasal spray Commonly known as: FLONASE Place 2 sprays into both nostrils at bedtime.   HYDROcodone-acetaminophen 5-325 MG tablet Commonly known as: NORCO/VICODIN Take 1 tablet by mouth every 6 (six) hours as needed for severe pain.   lansoprazole 15 MG capsule Commonly known as: PREVACID Take 15 mg by mouth daily.   LORazepam 0.5 MG tablet Commonly known as: ATIVAN Take 1 tablet (0.5 mg total) by mouth 2 (two) times daily.   mirtazapine 45 MG tablet Commonly known as: REMERON Take 45 mg by mouth at bedtime.   ondansetron 8 MG tablet Commonly known as: ZOFRAN Take 8 mg by mouth every 8 (eight) hours as needed for nausea or vomiting.   QUEtiapine 100 MG tablet Commonly known as: SEROQUEL Take 150 mg by mouth at bedtime.   simethicone 80 MG chewable tablet Commonly known as: Gas-X Chew 1 tablet (80 mg total) by mouth 4 (four) times daily as needed for flatulence.   simvastatin 20 MG tablet Commonly known as: ZOCOR Take 20 mg by mouth at bedtime.   sucralfate 1 GM/10ML suspension Commonly known as:  CARAFATE Take 10 mLs (1 g total) by mouth 4 (four) times daily -  with meals and at bedtime.   Vitamin D (Ergocalciferol) 1.25 MG (50000 UNIT) Caps capsule Commonly known as: DRISDOL Take 50,000 Units by mouth every 7 (seven) days. wednesday      Follow-up Information    Marchia Bond, MD. Schedule an appointment as soon as possible for a visit in 2 week(s).   Specialty: Orthopedic Surgery Contact information: [place] [address] [phone]           Time coordinating discharge: 39 minutes  Signed:  Tyiesha Brackney  [facility] 05/27/2020, 10:07 AM

## 2020-08-11 ENCOUNTER — Ambulatory Visit (HOSPITAL_COMMUNITY)
Admission: RE | Admit: 2020-08-11 | Discharge: 2020-08-11 | Disposition: A | Discharge status: Home / Self Care | Payer: Medicare Other | Source: Ambulatory Visit | Attending: Vascular Surgery | Admitting: Vascular Surgery

## 2020-08-11 ENCOUNTER — Other Ambulatory Visit: Payer: Self-pay

## 2020-08-11 DIAGNOSIS — R609 Edema, unspecified: Secondary | ICD-10-CM | POA: Insufficient documentation

## 2024-04-23 ENCOUNTER — Other Ambulatory Visit: Payer: Self-pay

## 2024-04-24 LAB — SURGICAL PATHOLOGY

## 2024-05-06 ENCOUNTER — Other Ambulatory Visit: Payer: Self-pay | Admitting: Radiology

## 2024-05-07 ENCOUNTER — Telehealth: Payer: Self-pay | Admitting: *Deleted

## 2024-05-07 ENCOUNTER — Encounter: Payer: Self-pay | Admitting: Family Medicine

## 2024-05-07 LAB — SURGICAL PATHOLOGY

## 2024-05-07 NOTE — Telephone Encounter (Signed)
 LVM to patient in reference to upcoming [facility] appointment on 5/21 arrive at 1215, left my contact to call back

## 2024-05-11 ENCOUNTER — Encounter: Payer: Self-pay | Admitting: *Deleted

## 2024-05-11 DIAGNOSIS — C50312 Malignant neoplasm of lower-inner quadrant of left female breast: Secondary | ICD-10-CM | POA: Insufficient documentation

## 2024-05-12 NOTE — Progress Notes (Signed)
 Radiation Oncology         [phone] ________________________________  Initial Outpatient Consultation  Name: [name] MRN: [id number]  Date: 05/13/2024  DOB: March 16, 1946  CC:Pcp, No  Caralyn Chandler, MD   REFERRING PHYSICIAN: [name], MD  DIAGNOSIS:    ICD-10-CM   1. Malignant neoplasm of lower-inner quadrant of left breast in female, estrogen receptor positive (HCC)  C50.312    Z17.0        Cancer Staging  Malignant neoplasm of lower-inner quadrant of left breast in female, estrogen receptor positive (HCC) Staging form: Breast, AJCC 8th Edition - Clinical stage from 05/13/2024: Stage IA (cT1b, cN0, cM0, G2, ER+, PR+, HER2-) - Signed by Cameron Cea, MD on 05/13/2024 Stage prefix: Initial diagnosis Histologic grading system: 3 grade system Laterality: Left Staged by: Pathologist and managing physician Stage used in treatment planning: Yes National guidelines used in treatment planning: Yes Type of national guideline used in treatment planning: NCCN  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 female who presented with a left breast abnormality on the following imaging: bilateral screening mammogram on the date of 04/06/24. No symptoms, if any, were reported at that time. A left breast diagnostic mammogram was subsequently performed on 04/20/24 which showed a 9 mm oval mass in the 6 o'clock left breast at a posterior depth. A left breast ultrasound was then performed that same date which redemonstrated the 9 mm oval mass in the 6 o'clock left breast (6 cmfn) suspicious of malignancy. No evidence of left axillary lymphadenopathy was demonstrated otherwise.    Biopsy of the 6 o'clock left breast on date of 04/23/24 showed grade 2 invasive ductal carcinoma measuring 7.5 mm in the greatest linear extent of the sample.  ER status: 100% positive and PR status 100% positive, both with strong staining intensity; Proliferation marker Ki67 at 1%; Her2 status negative; Grade 2.   To further evaluate a possible additional mass in the 9 o'clock left breast mass seen on biopsy imaging, a repeat left breast diagnostic mammogram was performed on 05/05 which confirmed an indeterminate 6 mm oval mass in the central 9 o'clock left breast. An ultrasound was then performed that same date which further revealed: two adjacent masses measuring 6 mm and 4 mm in the 9 o'clock left breast suspicious of malignancy. Another partially solid but indeterminate 9 mm mass was also demonstrated in the 10 o'clock left breast  Imaging again showed no evidence of left axillary lymphadenopathy.   Additional biopsies were accordingly collected on 05/06/24; results are detailed as follows:  -- Biopsy of the 10 o'clock left breast (7 cmfn) showed grade 2 invasive ductal carcinoma measuring 4 mm in the greatest linear extent of the sample, with intermediate grade DCIS. ER status 100% positive and PR status 95% positive, both with strong staining intensity; Proliferation marker Ki67 at 5%; Her2 status negative. -- Biopsy of the 9 o'clock left breast (4 cmfn) showed grade 2 invasive ductal carcinoma measuring 2 mm in the greatest linear extent of the sample.  -- Biopsy of the 10 o'clock left breast (4 cmfn) showed no evidence of malignancy.   She is here with her significant other.  She  states that her daughter was treated for breast cancer in our center a couple years ago.  PREVIOUS RADIATION THERAPY: Yes - Radiation therapy for right breast cancer from 02/24/2007 through 04/30/2007.   PAST MEDICAL HISTORY:  has a past medical history of Allergy, Anemia, Anxiety, Arthritis, Astigmatism,  Bursitis, Bursitis of hip, Cancer (HCC) (10/2006), Carpal tunnel syndrome, Cataracts, bilateral, Closed fracture of multiple pubic rami, left, initial encounter (HCC), Colitis, Depression, Drusen of both optic discs, Endometriosis, Fibromyalgia, Gall stones, GERD (gastroesophageal reflux disease), Hyperlipidemia, Pneumonia, and Tendon tear.    PAST SURGICAL HISTORY: Past Surgical History:  Procedure Laterality Date   APPENDECTOMY     BREAST SURGERY Right 12/11/2006   Lumpectomy   CARPAL TUNNEL RELEASE     CYST EXCISION     ELBOW SURGERY     LUMBAR FUSION      FAMILY HISTORY: family history includes Cancer in her father and mother; Heart disease in her brother, brother, father, and mother; Transient ischemic attack in her father; Vision loss in her sister.  SOCIAL HISTORY:  reports that she has quit smoking. She has never used smokeless tobacco. She reports that she does not drink alcohol and does not use drugs.  ALLERGIES: Augmentin [amoxicillin-pot clavulanate], Doxycycline , Sulfa drugs cross reactors, and Fentanyl  and related  MEDICATIONS:  Current Outpatient Medications  Medication Sig Dispense Refill   acetaminophen  (TYLENOL ) 325 MG tablet Take 2 tablets (650 mg total) by mouth every 6 (six) hours as needed for mild pain (or Fever >/= 101).     albuterol  (PROAIR  HFA) 108 (90 Base) MCG/ACT inhaler Inhale 2 puffs into the lungs every 4 (four) hours as needed for wheezing or shortness of breath. (Patient taking differently: Inhale 2 puffs into the lungs at bedtime.) 1 Inhaler 1   benzonatate  (TESSALON ) 100 MG capsule Take 1-2 capsules (100-200 mg total) by mouth 3 (three) times daily as needed for cough. (Patient not taking: Reported on 05/13/2024) 40 capsule 0   benztropine  (COGENTIN ) 1 MG tablet      cetirizine (ZYRTEC) 10 MG tablet Take 10 mg by mouth daily.       divalproex  (DEPAKOTE  ER) 500 MG 24 hr tablet Take 500 mg by mouth daily.     escitalopram  (LEXAPRO ) 20 MG tablet Take 20  mg by mouth daily.      fluticasone  (FLONASE ) 50 MCG/ACT nasal spray Place 2 sprays into both nostrils at bedtime.      HYDROcodone -acetaminophen  (NORCO/VICODIN) 5-325 MG tablet Take 1 tablet by mouth every 6 (six) hours as needed for severe pain. 15 tablet 0   lansoprazole (PREVACID) 15 MG capsule Take 15 mg by mouth daily.      LORazepam  (ATIVAN ) 0.5 MG tablet Take 1 tablet (0.5 mg total) by mouth 2 (two) times daily. 10 tablet 0   mirtazapine  (REMERON ) 45 MG tablet Take 45 mg by mouth at bedtime.     ondansetron  (ZOFRAN ) 8 MG tablet Take 8 mg by mouth every 8 (eight) hours as needed for nausea or vomiting.  (Patient not taking: Reported on 05/13/2024)     QUEtiapine  (SEROQUEL ) 100 MG tablet Take 150 mg by mouth at bedtime.      simethicone  (GAS-X) 80 MG chewable tablet Chew 1 tablet (80 mg total) by mouth 4 (four) times daily as needed for flatulence.     simvastatin  (ZOCOR ) 20 MG tablet Take 20 mg by mouth at bedtime.       sucralfate  (CARAFATE ) 1 GM/10ML suspension Take 10 mLs (1 g total)  by mouth 4 (four) times daily -  with meals and at bedtime. 420 mL 0   traMADol  (ULTRAM ) 50 MG tablet traMADol  HCl     Vitamin D , Ergocalciferol , (DRISDOL ) 50000 units CAPS capsule Take 50,000 Units by mouth every 7 (seven) days. wednesday     No current facility-administered medications for this encounter.    REVIEW OF SYSTEMS: As above in HPI.   PHYSICAL EXAM:  vitals were not taken for this visit.   General: Alert and oriented, in no acute distress HEENT: Head is normocephalic. Extraocular movements are intact.  Upper dentures noted. Heart: Regular in rate and rhythm with no murmurs, rubs, or gallops. Chest: Clear to auscultation bilaterally, with no rhonchi, wheezes, or rales. Abdomen: Soft, nontender, nondistended, with no rigidity or guarding. Extremities: No cyanosis or edema. Skin: No concerning lesions. Musculoskeletal: Ambulatory, thin Neurologic: Slightly blunted affect.  Cranial nerves II  through XII are grossly intact. No obvious focalities. Speech is fluent. Coordination is intact. Psychiatric: Judgment and insight are intact. Affect is appropriate. Breasts: At the 6 o'clock position of the left breast I can appreciate a mass that is approximately 1 cm in dimension.  Lateral right breast lumpectomy scar with tissue concavity noted but no sign of local right breast recurrence..  Radiation tattoo  appreciated over midline of chest.  No other masses are appreciated bilaterally in the axillar or breasts.      ECOG = 1  0 - Asymptomatic (Fully active, able to carry on all predisease activities without restriction)  1 - Symptomatic but completely ambulatory (Restricted in physically strenuous activity but ambulatory and able to carry out work of a light or sedentary nature. For example, light housework, office work)  2 - Symptomatic, <50% in bed during the day (Ambulatory and capable of all self care but unable to carry out any work activities. Up and about more than 50% of waking hours)  3 - Symptomatic, >50% in bed, but not bedbound (Capable of only limited self-care, confined to bed or chair 50% or more of waking hours)  4 - Bedbound (Completely disabled. Cannot carry on any self-care. Totally confined to bed or chair)  5 - Death   Aurea Blossom MM, Creech RH, Tormey DC, et al. [phone]). "Toxicity and response criteria of the [facility]". Am. Hillard Lowes. Oncol. 5 (6): 649-55   LABORATORY DATA:   CBC    Component Value Date/Time   WBC 6.3 05/13/2024 1226   WBC 6.4 05/26/2020 0405   RBC 4.29 05/13/2024 1226   HGB 13.8 05/13/2024 1226   HGB 12.6 02/04/2014 1358   HCT 39.5 05/13/2024 1226   HCT 38.4 02/04/2014 1358   PLT 213 05/13/2024 1226   PLT 207 02/04/2014 1358   MCV 92.1 05/13/2024 1226   MCV 88.6 05/15/2017 1607   MCV 90.1 02/04/2014 1358   MCH 32.2 05/13/2024 1226   MCHC 34.9 05/13/2024 1226   RDW 13.0 05/13/2024 1226   RDW 14.0 02/04/2014 1358    LYMPHSABS 2.1 05/13/2024 1226   LYMPHSABS 2.1 02/04/2014 1358   MONOABS 0.4 05/13/2024 1226   MONOABS 0.4 02/04/2014 1358   EOSABS 0.0 05/13/2024 1226   EOSABS 0.1 02/04/2014 1358   BASOSABS 0.0 05/13/2024 1226   BASOSABS 0.0 02/04/2014 1358    CMP     Component Value Date/Time   NA 139 05/13/2024 1226   NA 138 07/11/2017 1416   NA 140 02/04/2014 1358   K 4.1 05/13/2024 1226   K 4.1  02/04/2014 1358   CL 104 05/13/2024 1226   CL 104 12/11/2012 1045   CO2 30 05/13/2024 1226   CO2 26 02/04/2014 1358   GLUCOSE 104 (H) 05/13/2024 1226   GLUCOSE 128 02/04/2014 1358   GLUCOSE 100 (H) 12/11/2012 1045   BUN 12 05/13/2024 1226   BUN 16 07/11/2017 1416   BUN 12.5 02/04/2014 1358   CREATININE 0.93 05/13/2024 1226   CREATININE 0.9 02/04/2014 1358   CALCIUM 9.3 05/13/2024 1226   CALCIUM 9.3 02/04/2014 1358   PROT 6.7 05/13/2024 1226   PROT 6.9 02/04/2014 1358   ALBUMIN 4.1 05/13/2024 1226   ALBUMIN 4.0 02/04/2014 1358   AST 11 (L) 05/13/2024 1226   AST 14 02/04/2014 1358   ALT 6 05/13/2024 1226   ALT 9 02/04/2014 1358   ALKPHOS 51 05/13/2024 1226   ALKPHOS 50 02/04/2014 1358   BILITOT 0.3 05/13/2024 1226   BILITOT 0.27 02/04/2014 1358   GFRNONAA >60 05/13/2024 1226      RADIOGRAPHY: As above  IMPRESSION/PLAN: This is a very pleasant 78 year old woman with previous history of right breast cancer, now with multicentric left breast cancer.  She is highly motivated to preserve her breast.  She has spoken with Dr. Alethea Andes and anticipates undergoing a breast MRI, followed by multiple left breast lumpectomies if the MRI deems this realistic.  It was a pleasure meeting the patient today. We discussed the risks, benefits, and side effects of postlumpectomy radiotherapy.  If she undergoes breast conservation, I recommend radiotherapy to the left breast to reduce her risk of locoregional recurrence by 2/3.  We discussed that radiation would take approximately 3-4 weeks to complete and that  I would give the patient a few weeks to heal following surgery before starting treatment planning. We spoke about acute effects including skin irritation and fatigue as well as much less common late effects including internal organ injury or irritation. We spoke about the latest technology that is used to minimize the risk of late effects for patients undergoing radiotherapy to the breast or chest wall. No guarantees of treatment were given. The patient is enthusiastic about proceeding with treatment. I look forward to participating in the patient's care.  I will await her referral back to me for postoperative follow-up and eventual CT simulation/treatment planning.  On date of service, in total, I spent 45 minutes on this encounter. Patient was seen in person.   __________________________________________   Colie Dawes, MD  This document serves as a record of services personally performed by Colie Dawes, MD. It was created on her behalf by Aleta Anda, a trained medical scribe. The creation of this record is based on the scribe's personal observations and the provider's statements to them. This document has been checked and approved by the attending provider.

## 2024-05-13 ENCOUNTER — Encounter: Payer: Self-pay | Admitting: General Practice

## 2024-05-13 ENCOUNTER — Ambulatory Visit: Payer: Self-pay | Admitting: General Surgery

## 2024-05-13 ENCOUNTER — Inpatient Hospital Stay (HOSPITAL_BASED_OUTPATIENT_CLINIC_OR_DEPARTMENT_OTHER): Admitting: Genetic Counselor

## 2024-05-13 ENCOUNTER — Encounter: Payer: Self-pay | Admitting: Radiation Oncology

## 2024-05-13 ENCOUNTER — Ambulatory Visit: Attending: General Surgery | Admitting: Physical Therapy

## 2024-05-13 ENCOUNTER — Inpatient Hospital Stay: Attending: Hematology and Oncology

## 2024-05-13 ENCOUNTER — Ambulatory Visit
Admission: RE | Admit: 2024-05-13 | Discharge: 2024-05-13 | Disposition: A | Discharge status: Home / Self Care | Source: Ambulatory Visit | Attending: Radiation Oncology | Admitting: Radiation Oncology

## 2024-05-13 ENCOUNTER — Encounter: Payer: Self-pay | Admitting: Physical Therapy

## 2024-05-13 ENCOUNTER — Other Ambulatory Visit: Payer: Self-pay

## 2024-05-13 ENCOUNTER — Encounter: Payer: Self-pay | Admitting: *Deleted

## 2024-05-13 ENCOUNTER — Inpatient Hospital Stay (HOSPITAL_BASED_OUTPATIENT_CLINIC_OR_DEPARTMENT_OTHER): Admitting: Hematology and Oncology

## 2024-05-13 VITALS — BP 120/84 | HR 78 | Temp 97.6°F | Resp 18 | Wt 124.6 lb

## 2024-05-13 DIAGNOSIS — Z800 Family history of malignant neoplasm of digestive organs: Secondary | ICD-10-CM | POA: Diagnosis not present

## 2024-05-13 DIAGNOSIS — C50312 Malignant neoplasm of lower-inner quadrant of left female breast: Secondary | ICD-10-CM

## 2024-05-13 DIAGNOSIS — Z1732 Human epidermal growth factor receptor 2 negative status: Secondary | ICD-10-CM

## 2024-05-13 DIAGNOSIS — Z170 Estrogen receptor positive status [ER+]: Secondary | ICD-10-CM | POA: Insufficient documentation

## 2024-05-13 DIAGNOSIS — R293 Abnormal posture: Secondary | ICD-10-CM | POA: Diagnosis present

## 2024-05-13 DIAGNOSIS — Z1721 Progesterone receptor positive status: Secondary | ICD-10-CM

## 2024-05-13 DIAGNOSIS — Z8041 Family history of malignant neoplasm of ovary: Secondary | ICD-10-CM

## 2024-05-13 DIAGNOSIS — Z803 Family history of malignant neoplasm of breast: Secondary | ICD-10-CM

## 2024-05-13 LAB — CBC WITH DIFFERENTIAL (CANCER CENTER ONLY)
Abs Immature Granulocytes: 0.01 10*3/uL (ref 0.00–0.07)
Basophils Absolute: 0 10*3/uL (ref 0.0–0.1)
Basophils Relative: 0 %
Eosinophils Absolute: 0 10*3/uL (ref 0.0–0.5)
Eosinophils Relative: 1 %
HCT: 39.5 % (ref 36.0–46.0)
Hemoglobin: 13.8 g/dL (ref 12.0–15.0)
Immature Granulocytes: 0 %
Lymphocytes Relative: 33 %
Lymphs Abs: 2.1 10*3/uL (ref 0.7–4.0)
MCH: 32.2 pg (ref 26.0–34.0)
MCHC: 34.9 g/dL (ref 30.0–36.0)
MCV: 92.1 fL (ref 80.0–100.0)
Monocytes Absolute: 0.4 10*3/uL (ref 0.1–1.0)
Monocytes Relative: 7 %
Neutro Abs: 3.7 10*3/uL (ref 1.7–7.7)
Neutrophils Relative %: 59 %
Platelet Count: 213 10*3/uL (ref 150–400)
RBC: 4.29 MIL/uL (ref 3.87–5.11)
RDW: 13 % (ref 11.5–15.5)
WBC Count: 6.3 10*3/uL (ref 4.0–10.5)
nRBC: 0 % (ref 0.0–0.2)

## 2024-05-13 LAB — GENETIC SCREENING ORDER

## 2024-05-13 LAB — CMP (CANCER CENTER ONLY)
ALT: 6 U/L (ref 0–44)
AST: 11 U/L — ABNORMAL LOW (ref 15–41)
Albumin: 4.1 g/dL (ref 3.5–5.0)
Alkaline Phosphatase: 51 U/L (ref 38–126)
Anion gap: 5 (ref 5–15)
BUN: 12 mg/dL (ref 8–23)
CO2: 30 mmol/L (ref 22–32)
Calcium: 9.3 mg/dL (ref 8.9–10.3)
Chloride: 104 mmol/L (ref 98–111)
Creatinine: 0.93 mg/dL (ref 0.44–1.00)
GFR, Estimated: >60 mL/min (ref >60)
Glucose, Bld: 104 mg/dL — ABNORMAL HIGH (ref 70–99)
Potassium: 4.1 mmol/L (ref 3.5–5.1)
Sodium: 139 mmol/L (ref 135–145)
Total Bilirubin: 0.3 mg/dL (ref 0.0–1.2)
Total Protein: 6.7 g/dL (ref 6.5–8.1)

## 2024-05-13 NOTE — Therapy (Signed)
 OUTPATIENT PHYSICAL THERAPY BREAST CANCER BASELINE EVALUATION   Patient Name: [name] MRN: [id number] DOB:1946/10/16, 78 y.o., female Today's Date: 05/13/2024  END OF SESSION:  PT End of Session - 05/13/24 1645     Visit Number 1    Number of Visits 2    Date for PT Re-Evaluation 07/08/24    PT Start Time 1343    PT Stop Time 1359   Also saw pt from 1518-1530 for a total of 28 min   PT Time Calculation (min) 16 min    Activity Tolerance Patient tolerated treatment well    Behavior During Therapy [facility] for tasks assessed/performed             Past Medical History:  Diagnosis Date   Allergy    Anemia    Anxiety    Arthritis    Astigmatism    Bursitis    Bursitis of hip    Cancer (HCC) 10/2006   Invasive ductal carcinoma of the right breast    Carpal tunnel syndrome    Cataracts, bilateral    Closed fracture of multiple pubic rami, left, initial encounter (HCC)    Colitis    Depression    Drusen of both optic discs    Endometriosis    Fibromyalgia    Gall stones    GERD (gastroesophageal reflux disease)    Hyperlipidemia    Pneumonia    Tendon tear    Past Surgical History:  Procedure Laterality Date   APPENDECTOMY     BREAST SURGERY Right 12/11/2006   Lumpectomy   CARPAL TUNNEL RELEASE     CYST EXCISION     ELBOW SURGERY     LUMBAR FUSION     Patient Active Problem List   Diagnosis Date Noted   Malignant neoplasm of lower-inner quadrant of left breast in female, estrogen receptor positive (HCC) 05/11/2024   Acute stress reaction 05/22/2020   Closed fracture of multiple pubic rami, left, initial encounter (HCC)    Fall 05/18/2020   Bipolar affective disorder, manic (HCC) 12/28/2017   Breast cancer, right breast (HCC) 02/04/2014   ANEMIA 08/22/2010   DEPRESSION 08/22/2010   HIATAL HERNIA 08/22/2010   DERMATITIS 08/22/2010   CHEST PAIN 08/22/2010   HYPERGLYCEMIA 08/22/2010   HYPERLIPIDEMIA 09/16/2008   ANXIETY DEPRESSION 09/16/2008   HX OF  GALLSTONE 09/16/2008    REFERRING PROVIDER: Dr. Lillette Reid  REFERRING DIAG: Left breast cancer  THERAPY DIAG:  Malignant neoplasm of lower-inner quadrant of left breast in female, estrogen receptor positive (HCC)  Abnormal posture  Rationale for Evaluation and Treatment: Rehabilitation  ONSET DATE: 04/06/2024  SUBJECTIVE:  SUBJECTIVE STATEMENT: Patient reports she is here today to be seen by her medical team for her newly diagnosed left breast cancer.   PERTINENT HISTORY:  Patient was diagnosed on 04/06/2024 with left grade 2 invasive ductal carcinoma breast cancer. There are several areas located in the lower inner quadrant measuring 6 mm, 6 mm, 4 mm, and 9 mm. It is ER/PR positive and HER2 negative with a Ki67 of 1%. She has a history of right breast cancer in 2007. She had a right lumpectomy and sentinel node biopsy with 4 nodes removed followed by radiation.   PATIENT GOALS:   reduce lymphedema risk and learn post op HEP.   PAIN:  Are you having pain? No except she c/o some gastrointestinal problems which cause pain at times  PRECAUTIONS: Active CA   RED FLAGS: None   HAND DOMINANCE: right  WEIGHT BEARING RESTRICTIONS: No  FALLS:  Has patient fallen in last 6 months? No  LIVING ENVIRONMENT: Patient lives with: alone Lives in: House/apartment Has following equipment at home: None  OCCUPATION: Retired  LEISURE: She does not exercise  PRIOR LEVEL OF FUNCTION: Independent   OBJECTIVE: Note: Objective measures were completed at Evaluation unless otherwise noted.  COGNITION: Overall cognitive status: Within functional limits for tasks assessed    POSTURE:  Forward head and rounded shoulders posture  UPPER EXTREMITY AROM/PROM:  A/PROM RIGHT   eval   Shoulder extension    Shoulder flexion   Shoulder abduction   Shoulder internal rotation   Shoulder external rotation     (Blank rows = not tested)  A/PROM LEFT   eval  Shoulder extension   Shoulder flexion   Shoulder abduction   Shoulder internal rotation   Shoulder external rotation     (Blank rows = not tested)  CERVICAL AROM: All within normal limits:    Percent limited  Flexion   Extension   Right lateral flexion   Left lateral flexion   Right rotation   Left rotation     UPPER EXTREMITY STRENGTH: WFL  LYMPHEDEMA ASSESSMENTS (in cm):   LANDMARK RIGHT   eval  10 cm proximal to olecranon process 22.9  Olecranon process 22.3  10 cm proximal to ulnar styloid process 20  Just proximal to ulnar styloid process 14.5  Across hand at thumb web space 17.5  At base of 2nd digit 6  (Blank rows = not tested)  LANDMARK LEFT   eval  10 cm proximal to olecranon process 22.8  Olecranon process 21.8  10 cm proximal to ulnar styloid process 18.7  Just proximal to ulnar styloid process 14.5  Across hand at thumb web space 16.6  At base of 2nd digit 5.5  (Blank rows = not tested)  L-DEX LYMPHEDEMA SCREENING:  The patient was assessed using the L-Dex machine today to produce a lymphedema index baseline score. The patient will be reassessed on a regular basis (typically every 3 months) to obtain new L-Dex scores. If the score is > 6.5 points away from his/her baseline score indicating onset of subclinical lymphedema, it will be recommended to wear a compression garment for 4 weeks, 12 hours per day and then be reassessed. If the score continues to be > 6.5 points from baseline at reassessment, we will initiate lymphedema treatment. Assessing in this manner has a 95% rate of preventing clinically significant lymphedema.   L-DEX FLOWSHEETS - 05/13/24 1600       L-DEX LYMPHEDEMA SCREENING   Measurement Type Unilateral    L-DEX  MEASUREMENT EXTREMITY Upper Extremity    POSITION  Standing     DOMINANT SIDE Right    At Risk Side Left    BASELINE SCORE (UNILATERAL) 0.2             QUICK DASH SURVEY:  Cindia Crease - 05/13/24 0001     Open a tight or new jar Mild difficulty    Do heavy household chores (wash walls, wash floors) Unable    Carry a shopping bag or briefcase No difficulty    Wash your back Unable    Use a knife to cut food No difficulty    Recreational activities in which you take some force or impact through your arm, shoulder, or hand (golf, hammering, tennis) Unable    During the past week, to what extent has your arm, shoulder or hand problem interfered with your normal social activities with family, friends, neighbors, or groups? Not at all    During the past week, to what extent has your arm, shoulder or hand problem limited your work or other regular daily activities Not at all    Arm, shoulder, or hand pain. Mild    Tingling (pins and needles) in your arm, shoulder, or hand None    Difficulty Sleeping No difficulty    DASH Score 31.82 %              PATIENT EDUCATION:  Education details: Time spent educating patient on aspects of self-care to maximize post op recovery. Patient was educated on where and how to get a post op compression bra to use to reduce post op edema. Patient was also educated on the use of SOZO screenings and surveillance principles for early identification of lymphedema onset. She was instructed to use the post op pillow in the axilla for pressure and pain relief. Patient educated on lymphedema risk reduction and post op shoulder/posture HEP. Person educated: Patient Education method: Explanation, Demonstration, Handout Education comprehension: Patient verbalized understanding and returned demonstration  HOME EXERCISE PROGRAM: Patient was instructed today in a home exercise program today for post op shoulder range of motion. These included active assist shoulder flexion in sitting, scapular retraction, wall walking with shoulder  abduction, and hands behind head external rotation.  She was encouraged to do these twice a day, holding 3 seconds and repeating 5 times when permitted by her physician.   ASSESSMENT:  CLINICAL IMPRESSION: Patient was diagnosed on 04/06/2024 with left grade 2 invasive ductal carcinoma breast cancer. There are several areas located in the lower inner quadrant measuring 6 mm, 6 mm, 4 mm, and 9 mm. It is ER/PR positive and HER2 negative with a Ki67 of 1%. She has a history of right breast cancer in 2007. She had a right lumpectomy and sentinel node biopsy with 4 nodes removed followed by radiation. Her multidisciplinary medical team met prior to her assessments to determine a recommended treatment plan. She is planning to have a left lumpectomy and sentinel node biopsy, radiation, and anti-estrogen therapy. She will benefit from a post op PT reassessment to determine needs and from L-Dex screens every 3 months for 2 years to detect subclinical lymphedema.  Pt will benefit from skilled therapeutic intervention to improve on the following deficits: Decreased knowledge of precautions, impaired UE functional use, pain, decreased ROM, postural dysfunction.   PT treatment/interventions: ADL/self-care home management, pt/family education, therapeutic exercise  REHAB POTENTIAL: Excellent  CLINICAL DECISION MAKING: Stable/uncomplicated  EVALUATION COMPLEXITY: Low   GOALS: Goals reviewed with patient? YES  LONG TERM GOALS: (STG=LTG)    Name Target Date Goal status  1 Pt will be able to verbalize understanding of pertinent lymphedema risk reduction practices relevant to her dx specifically related to skin care.  Baseline:  No knowledge 05/13/2024 Achieved at eval  2 Pt will be able to return demo and/or verbalize understanding of the post op HEP related to regaining shoulder ROM. Baseline:  No knowledge 05/13/2024 Achieved at eval  3 Pt will be able to verbalize understanding of the importance of viewing  the post op After Breast CA Class video for further lymphedema risk reduction education and therapeutic exercise.  Baseline:  No knowledge 05/13/2024 Achieved at eval  4 Pt will demo she has regained full shoulder ROM and function post operatively compared to baselines.  Baseline: See objective measurements taken today. 07/08/2024     PLAN:  PT FREQUENCY/DURATION: EVAL and 1 follow up appointment.   PLAN FOR NEXT SESSION: will reassess 3-4 weeks post op to determine needs.   Patient will follow up at outpatient cancer rehab 3-4 weeks following surgery.  If the patient requires physical therapy at that time, a specific plan will be dictated and sent to the referring physician for approval. The patient was educated today on appropriate basic range of motion exercises to begin post operatively and the importance of viewing the After Breast Cancer class video following surgery.  Patient was educated today on lymphedema risk reduction practices as it pertains to recommendations that will benefit the patient immediately following surgery.  She verbalized good understanding.    Physical Therapy Information for After Breast Cancer Surgery/Treatment:  Lymphedema is a swelling condition that you may be at risk for in your arm if you have lymph nodes removed from the armpit area.  After a sentinel node biopsy, the risk is approximately 5-9% and is higher after an axillary node dissection.  There is treatment available for this condition and it is not life-threatening.  Contact your physician or physical therapist with concerns. You may begin the 4 shoulder/posture exercises (see additional sheet) when permitted by your physician (typically a week after surgery).  If you have drains, you may need to wait until those are removed before beginning range of motion exercises.  A general recommendation is to not lift your arms above shoulder height until drains are removed.  These exercises should be done to your  tolerance and gently.  This is not a "no pain/no gain" type of recovery so listen to your body and stretch into the range of motion that you can tolerate, stopping if you have pain.  If you are having immediate reconstruction, ask your plastic surgeon about doing exercises as he or she may want you to wait. We encourage you to view the After Breast Cancer class video following surgery.  You will learn information related to lymphedema risk, prevention and treatment and additional exercises to regain mobility following surgery.   While undergoing any medical procedure or treatment, try to avoid blood pressure being taken or needle sticks from occurring on the arm on the side of cancer.   This recommendation begins after surgery and continues for the rest of your life.  This may help reduce your risk of getting lymphedema (swelling in your arm). An excellent resource for those seeking information on lymphedema is the National Lymphedema Network's web site. It can be accessed at www.lymphnet.org If you notice swelling in your hand, arm or breast at any time following surgery (even if it is  many years from now), please contact your doctor or physical therapist to discuss this.  Lymphedema can be treated at any time but it is easier for you if it is treated early on.  If you feel like your shoulder motion is not returning to normal in a reasonable amount of time, please contact your surgeon or physical therapist.  [facility] [phone]. [address], [address], [place] [address]  [facility] CLASS After Breast Cancer Class  After Breast Cancer Class is a specially designed exercise class video to assist you in a safe recover after having breast cancer surgery.  In this video you will learn how to get back to full function whether your drains were just removed or if you had surgery a month ago. The video can be viewed on this page:  https://www.boyd-meyer.org/ or on YouTube here: [email or web address].  Class Goals  Understand specific stretches to improve the flexibility of you chest and shoulder. Learn ways to safely strengthen your upper body and improve your posture. Understand the warning signs of infection and why you may be at risk for an arm infection. Learn about Lymphedema and prevention.  ** You do not need to view this video until after surgery.  Drains should be removed to participate in the recommended exercises on the video.  Patient was instructed today in a home exercise program today for post op shoulder range of motion. These included active assist shoulder flexion in sitting, scapular retraction, wall walking with shoulder abduction, and hands behind head external rotation.  She was encouraged to do these twice a day, holding 3 seconds and repeating 5 times when permitted by her physician.   Rollin Clock, [place] 05/13/24 4:54 PM

## 2024-05-13 NOTE — Assessment & Plan Note (Addendum)
 05/06/2024:2007: Right breast IDC/DCIS: Lumpectomy radiation declined tamoxifen Screening mammogram detected left breast mass 6:00: 0.9 cm, additional mass 0.6 cm at 10:00, 3 additional masses 0.6 cm 0.4 cm and 0.7 cm (benign concordant), biopsy revealed grade 2 IDC ER 100% PR 95% Ki67 1%, HER2 negative by FISH  Pathology and radiology counseling:Discussed with the patient, the details of pathology including the type of breast cancer,the clinical staging, the significance of ER, PR and HER-2/neu receptors and the implications for treatment. After reviewing the pathology in detail, we proceeded to discuss the different treatment options between surgery, radiation, antiestrogen therapies.  Recommendations: 1. Breast conserving surgery (triple lumpectomies versus mastectomy based on breast MRI) 2. Adjuvant radiation therapy if she undergoes lumpectomy 3. Adjuvant antiestrogen therapy  Weight loss: It appears that the patient has lost significant amount of weight and is very concerned about it.  I suspect it may be due to some of her medications for depression and anxiety.  She will discuss this with her psychiatrist.  We might prescribe, medications to improve weight once she is done with radiation.  She will try some CBD products on her own

## 2024-05-13 NOTE — Progress Notes (Signed)
 [facility] Spiritual Care Note  Met with Mylisa in Breast Multidisciplinary Clinic to introduce Support Center team/resources. She completed SDOH screening; results follow below.    SDOH Screenings   Food Insecurity: No Food Insecurity (05/13/2024)  Housing: Unknown (05/13/2024)  Transportation Needs: No Transportation Needs (05/13/2024)  Utilities: Not At Risk (05/13/2024)  Depression (PHQ2-9): Medium Risk (05/13/2024)  Tobacco Use: Medium Risk (05/13/2024)    Chaplain and patient discussed common feelings and emotions when being diagnosed with cancer, and the importance of support during treatment.  Chaplain informed patient of the support team and support services at [facility].  Chaplain provided contact information and encouraged patient to call with any questions or concerns.  Follow up needed: Yes.  Plan to follow up by phone in ca two weeks for Spiritual Care check-in and to revisit possible interest in Sports coach.   [address] Dorice Gardner, [place], [facility] Pager [phone] Voicemail [phone]

## 2024-05-13 NOTE — Progress Notes (Signed)
 [facility] CONSULT NOTE  Patient Care Team: Pcp, No as PCP - General Cameron Cea, MD as Consulting Physician (Hematology and Oncology) Auther Bo, RN as Oncology Nurse Navigator Alane Hsu, RN as Oncology Nurse Navigator Caralyn Chandler, MD as Consulting Physician (General Surgery) Colie Dawes, MD as Attending Physician (Radiation Oncology)  CHIEF COMPLAINTS/PURPOSE OF CONSULTATION:  Newly diagnosed breast cancer  HISTORY OF PRESENTING ILLNESS:  [name] is a 78 year old with recently diagnosed breast cancer.  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  She underwent radiation but declined tamoxifen mainly because of profound hot flashes.  She had a recent mammogram that detected a left breast mass at 6 o'clock position which led to additional diagnostic mammograms and ultrasounds.  There were multiple findings including the 0.8 cm mass and 0.6 cm mass at 10:00 and 3 additional masses measuring 0.6 cm, 0.4 cm and 0.7 cm.  The last mass was biopsy-proven to be benign.  The others were biopsy-proven to be grade 2 IDC ER/PR positive HER2 negative with a Ki-67 of 1%.  She was presented this morning to the multidiscipline tumor board.  She is here today accompanied by her ex-husband to discuss her treatment plan.   I reviewed her records extensively and collaborated the history with the patient.  SUMMARY OF ONCOLOGIC HISTORY: 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: Lumpectomy radiation declined tamoxifen Screening mammogram detected left breast mass 6:00: 0.9 cm, additional mass 0.6 cm at 10:00, 3 additional masses 0.6 cm 0.4 cm and 0.7 cm (benign concordant), biopsy revealed grade 2 IDC ER 100% PR 95% Ki67 1%, HER2 negative by FISH   05/13/2024 Cancer Staging   Staging form: Breast,  AJCC 8th Edition - Clinical stage from 05/13/2024: Stage IA (cT1b, cN0, cM0, G2, ER+, PR+, HER2-) - Signed by Cameron Cea, MD on 05/13/2024 Stage prefix: Initial diagnosis Histologic grading system: 3 grade system Laterality: Left Staged by: Pathologist and managing physician Stage used in treatment planning: Yes National guidelines used in treatment planning: Yes Type of national guideline used in treatment planning: NCCN      MEDICAL HISTORY:  Past Medical History:  Diagnosis Date   Allergy    Anemia    Anxiety    Arthritis    Astigmatism    Bursitis    Bursitis of hip    Cancer (HCC) 10/2006   Invasive ductal carcinoma of the right breast    Carpal tunnel syndrome    Cataracts, bilateral    Closed fracture of multiple pubic rami, left, initial encounter (HCC)    Colitis    Depression    Drusen of both optic discs    Endometriosis    Fibromyalgia    Gall stones    GERD (gastroesophageal reflux disease)    Hyperlipidemia    Pneumonia    Tendon tear     SURGICAL HISTORY: Past Surgical History:  Procedure Laterality Date   APPENDECTOMY     BREAST SURGERY Right 12/11/2006   Lumpectomy   CARPAL TUNNEL RELEASE     CYST EXCISION     ELBOW SURGERY     LUMBAR FUSION      SOCIAL HISTORY: Social History   Socioeconomic History   Marital status: Divorced    Spouse name: Not on file   Number of children: Not on  file   Years of education: Not on file   Highest education level: Not on file  Occupational History   Not on file  Tobacco Use   Smoking status: Former   Smokeless tobacco: Never   Tobacco comments:    3-4 packs per day x 30 years  Substance and Sexual Activity   Alcohol use: No    Comment: AA since 1989   Drug use: No    Comment: stopped 1989   Sexual activity: Never    Birth control/protection: Post-menopausal  Other Topics Concern   Not on file  Social History Narrative   Lives at home   Social Drivers of Health   Financial Resource  Strain: Not on file  Food Insecurity: No Food Insecurity (05/13/2024)   Hunger Vital Sign    Worried About Running Out of Food in the Last Year: Never true    Ran Out of Food in the Last Year: Never true  Transportation Needs: No Transportation Needs (05/13/2024)   PRAPARE - Administrator, Civil Service (Medical): No    Lack of Transportation (Non-Medical): No  Physical Activity: Not on file  Stress: Not on file  Social Connections: Not on file  Intimate Partner Violence: Patient Unable To Answer (05/13/2024)   Humiliation, Afraid, Rape, and Kick questionnaire    Fear of Current or Ex-Partner: Patient unable to answer    Emotionally Abused: Patient unable to answer    Physically Abused: Patient unable to answer    Sexually Abused: Patient unable to answer    FAMILY HISTORY: Family History  Problem Relation Age of Onset   Cancer Mother    Heart disease Mother    Cancer Father        Colon cancer   Heart disease Father    Transient ischemic attack Father    Heart disease Brother    Heart disease Brother    Vision loss Sister     ALLERGIES:  is allergic to augmentin [amoxicillin-pot clavulanate], doxycycline , sulfa drugs cross reactors, and fentanyl  and related.  MEDICATIONS:  Current Outpatient Medications  Medication Sig Dispense Refill   acetaminophen  (TYLENOL ) 325 MG tablet Take 2 tablets (650 mg total) by mouth every 6 (six) hours as needed for mild pain (or Fever >/= 101).     albuterol  (PROAIR  HFA) 108 (90 Base) MCG/ACT inhaler Inhale 2 puffs into the lungs every 4 (four) hours as needed for wheezing or shortness of breath. (Patient taking differently: Inhale 2 puffs into the lungs at bedtime.) 1 Inhaler 1   benztropine  (COGENTIN ) 1 MG tablet      cetirizine (ZYRTEC) 10 MG tablet Take 10 mg by mouth daily.       divalproex  (DEPAKOTE  ER) 500 MG 24 hr tablet Take 500 mg by mouth daily.     escitalopram  (LEXAPRO ) 20 MG tablet Take 20 mg by mouth daily.       fluticasone  (FLONASE ) 50 MCG/ACT nasal spray Place 2 sprays into both nostrils at bedtime.      HYDROcodone -acetaminophen  (NORCO/VICODIN) 5-325 MG tablet Take 1 tablet by mouth every 6 (six) hours as needed for severe pain. 15 tablet 0   lansoprazole (PREVACID) 15 MG capsule Take 15 mg by mouth daily.      LORazepam  (ATIVAN ) 0.5 MG tablet Take 1 tablet (0.5 mg total) by mouth 2 (two) times daily. 10 tablet 0   mirtazapine  (REMERON ) 45 MG tablet Take 45 mg by mouth at bedtime.     QUEtiapine  (SEROQUEL ) 100  MG tablet Take 150 mg by mouth at bedtime.      simethicone  (GAS-X) 80 MG chewable tablet Chew 1 tablet (80 mg total) by mouth 4 (four) times daily as needed for flatulence.     simvastatin  (ZOCOR ) 20 MG tablet Take 20 mg by mouth at bedtime.       sucralfate  (CARAFATE ) 1 GM/10ML suspension Take 10 mLs (1 g total) by mouth 4 (four) times daily -  with meals and at bedtime. 420 mL 0   traMADol  (ULTRAM ) 50 MG tablet traMADol  HCl     Vitamin D , Ergocalciferol , (DRISDOL ) 50000 units CAPS capsule Take 50,000 Units by mouth every 7 (seven) days. wednesday     benzonatate  (TESSALON ) 100 MG capsule Take 1-2 capsules (100-200 mg total) by mouth 3 (three) times daily as needed for cough. (Patient not taking: Reported on 05/13/2024) 40 capsule 0   ondansetron  (ZOFRAN ) 8 MG tablet Take 8 mg by mouth every 8 (eight) hours as needed for nausea or vomiting.  (Patient not taking: Reported on 05/13/2024)     No current facility-administered medications for this visit.    REVIEW OF SYSTEMS:   Constitutional: Denies fevers, chills or abnormal night sweats Breast:  Denies any palpable lumps or discharge All other systems were reviewed with the patient and are negative.  PHYSICAL EXAMINATION: ECOG PERFORMANCE STATUS: 2 - Symptomatic, <50% confined to bed  Vitals:   05/13/24 1329  BP: 120/84  Pulse: 78  Resp: 18  Temp: 97.6 F (36.4 C)  SpO2: 100%   Filed Weights   05/13/24 1329  Weight: 124 lb 9.6 oz  (56.5 kg)    GENERAL:alert, no distress and comfortable    LABORATORY DATA:  I have reviewed the data as listed Lab Results  Component Value Date   WBC 6.3 05/13/2024   HGB 13.8 05/13/2024   HCT 39.5 05/13/2024   MCV 92.1 05/13/2024   PLT 213 05/13/2024   Lab Results  Component Value Date   NA 139 05/13/2024   K 4.1 05/13/2024   CL 104 05/13/2024   CO2 30 05/13/2024    RADIOGRAPHIC STUDIES: I have personally reviewed the radiological reports and agreed with the findings in the report.  ASSESSMENT AND PLAN:  Malignant neoplasm of lower-inner quadrant of left breast in female, estrogen receptor positive (HCC) 05/06/2024:2007: Right breast IDC/DCIS: Lumpectomy radiation declined tamoxifen Screening mammogram detected left breast mass 6:00: 0.9 cm, additional mass 0.6 cm at 10:00, 3 additional masses 0.6 cm 0.4 cm and 0.7 cm (benign concordant), biopsy revealed grade 2 IDC ER 100% PR 95% Ki67 1%, HER2 negative by FISH  Pathology and radiology counseling:Discussed with the patient, the details of pathology including the type of breast cancer,the clinical staging, the significance of ER, PR and HER-2/neu receptors and the implications for treatment. After reviewing the pathology in detail, we proceeded to discuss the different treatment options between surgery, radiation, antiestrogen therapies.  Recommendations: 1. Breast conserving surgery (triple lumpectomies versus mastectomy based on breast MRI) 2. Adjuvant radiation therapy if she undergoes lumpectomy 3. Adjuvant antiestrogen therapy  Weight loss: It appears that the patient has lost significant amount of weight and is very concerned about it.  I suspect it may be due to some of her medications for depression and anxiety.  She will discuss this with her psychiatrist.  We might prescribe, medications to improve weight once she is done with radiation.  She will try some CBD products on her own    All questions were  answered.  The patient knows to call the clinic with any problems, questions or concerns.    Viinay K Deward Sebek, MD 05/13/24

## 2024-05-14 ENCOUNTER — Telehealth: Payer: Self-pay | Admitting: *Deleted

## 2024-05-14 ENCOUNTER — Encounter: Payer: Self-pay | Admitting: Genetic Counselor

## 2024-05-14 NOTE — Telephone Encounter (Signed)
 LVM to patient in reference to upcoming Breast MRI  appointment on Friday 5/23 arrive at 645 in the ED, left my contact to call back

## 2024-05-14 NOTE — Progress Notes (Addendum)
 REFERRING PROVIDER: Cameron Cea, MD [address] [place],  [place] [address]  PRIMARY PROVIDER:  None listed  PRIMARY REASON FOR VISIT:  1. Malignant neoplasm of lower-inner quadrant of left breast in female, estrogen receptor positive (HCC)   2. Family history of breast cancer   3. Family history of ovarian cancer   4. Family history of colon cancer     HISTORY OF PRESENT ILLNESS:   Ms. Fausto, a 78 y.o. female, was seen for a [place] cancer genetics consultation at the request of Dr. Lee Public due to a personal history of breast cancer.  Ms. Napoli presents to clinic today to discuss the possibility of a hereditary predisposition to cancer, to discuss genetic testing, and to further clarify her future cancer risks, as well as potential cancer risks for family members.   In May 2025, at the age of 26, Ms. Brave was diagnosed with invasive ductal carcinoma of the left breast (ER+/PR+/HER2-). The treatment plan is pending.  In 2007, at the age of 36, Ms. Zucker was diagnosed with right breast cancer, treated with lumpectomy and adjuvant radiation.    CANCER HISTORY:  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: Lumpectomy radiation declined tamoxifen Screening mammogram detected left breast mass 6:00: 0.9 cm, additional mass 0.6 cm at 10:00, 3 additional masses 0.6 cm 0.4 cm and 0.7 cm (benign concordant), biopsy revealed grade 2 IDC ER 100% PR 95% Ki67 1%, HER2 negative by FISH   05/13/2024 Cancer Staging   Staging form: Breast, AJCC 8th Edition - Clinical stage from 05/13/2024: Stage IA (cT1b, cN0, cM0, G2, ER+, PR+, HER2-) - Signed by Cameron Cea, MD on 05/13/2024 Stage prefix: Initial diagnosis Histologic grading system: 3 grade system Laterality: Left Staged by: Pathologist and managing physician Stage used in treatment  planning: Yes National guidelines used in treatment planning: Yes Type of national guideline used in treatment planning: NCCN      Past Medical History:  Diagnosis Date   Allergy    Anemia    Anxiety    Arthritis    Astigmatism    Bursitis    Bursitis of hip    Cancer (HCC) 10/2006   Invasive ductal carcinoma of the right breast    Carpal tunnel syndrome    Cataracts, bilateral    Closed fracture of multiple pubic rami, left, initial encounter (HCC)    Colitis    Depression    Drusen of both optic discs    Endometriosis    Fibromyalgia    Gall stones    GERD (gastroesophageal reflux disease)    Hyperlipidemia    Pneumonia    Tendon tear     Past Surgical History:  Procedure Laterality Date   APPENDECTOMY     BREAST SURGERY Right 12/11/2006   Lumpectomy   CARPAL TUNNEL RELEASE     CYST EXCISION     ELBOW SURGERY     LUMBAR FUSION      FAMILY HISTORY:  We obtained a detailed, 4-generation family history.  Significant diagnoses are listed below: Family History  Problem Relation Age of Onset   Cancer Mother        unknown primary; "chest"?   Colon cancer Father 65   Breast cancer Paternal Aunt 40   Cancer Paternal Aunt        unknown type; dx >50   Lung cancer Paternal Aunt  oat cell   Lung cancer Paternal Uncle    Ovarian cancer Maternal Grandmother        dx >50   Cancer Paternal Grandfather        unknown type; dx >50   Ovarian cancer Cousin        dx 13s; pat cousin   Lung cancer Cousin        pat female cousin    Ms. Durnell thinks her daughter had negative genetic testing at the time of her breast cancer diagnosis.  No report was available for review today.    There is no reported Ashkenazi Jewish ancestry. There is no known consanguinity.  GENETIC COUNSELING ASSESSMENT: Ms. Lengacher is a 78 y.o. female with a personal and family history which is somewhat suggestive of a hereditary cancer syndrome and predisposition to cancer given her  history of two primary breast cancers and her family history of related cancer (breast, ovarian, etc). We, therefore, discussed and recommended the following at today's visit.   DISCUSSION: We discussed that 5 - 10% of cancer is hereditary.  Most cases of hereditary breast cancer is associated with mutations in BRCA1/2.  There are other genes that can be associated with hereditary breast, ovarian, or other cancer syndromes.  We discussed that testing is beneficial for several reasons including knowing how to follow individuals for their cancer risks and understanding if other family members could be at an increased risk for cancer and allowing them to undergo genetic testing.   We reviewed the characteristics, features and inheritance patterns of hereditary cancer syndromes. We also discussed genetic testing, including the appropriate family members to test, the process of testing, insurance coverage and turn-around-time for results. We discussed the implications of a negative, positive, carrier and/or variant of uncertain significant result. We recommended Ms. Berti pursue genetic testing for a panel that includes genes associated with breast, ovarian, colon, and other cancers.   The CancerNext-Expanded gene panel offered by [facility] and includes sequencing, rearrangement, and RNA analysis for the following 77 genes: AIP, ALK, APC, ATM, AXIN2, BAP1, BARD1, BMPR1A, BRCA1, BRCA2, BRIP1, CDC73, CDH1, CDK4, CDKN1B, CDKN2A, CEBPA, CHEK2, CTNNA1, DDX41, DICER1, ETV6, FH, FLCN, GATA2, LZTR1, MAX, MBD4, MEN1, MET, MLH1, MSH2, MSH3, MSH6, MUTYH, NF1, NF2, NTHL1, PALB2, PHOX2B, PMS2, POT1, PRKAR1A, PTCH1, PTEN, RAD51C, RAD51D, RB1, RET, RSP20, RUNX1, SDHA, SDHAF2, SDHB, SDHC, SDHD, SMAD4, SMARCA4, SMARCB1, SMARCE1, STK11, SUFU, TMEM127, TP53, TSC1, TSC2, VHL, and WT1 (sequencing and deletion/duplication); EGFR, HOXB13, KIT, MITF, PDGFRA, POLD1, and POLE (sequencing only); EPCAM and GREM1 (deletion/duplication  only).   Based on Ms. Morella's personal history of two primary breast cancers, she meets NCCN medical criteria for genetic testing. She is the most informative relative available for genetic testing. Despite that she meets criteria, she may still have an out of pocket cost. We discussed that if her out of pocket cost for testing is over $100, the laboratory should contact her and discuss the self-pay prices and/or patient pay assistance programs.    PLAN: After considering the risks, benefits, and limitations, Ms. Pongratz provided informed consent to pursue genetic testing and the blood sample was sent to [facility] for analysis of the CancerNext-Expanded +RNAinsight Panel. Results should be available within approximately 3 weeks, at which point they will be disclosed by telephone to Ms. Prew, as will any additional recommendations warranted by these results. Ms. Morocco will receive a summary of her genetic counseling visit and a copy of her results once available. This information will  also be available in Epic.    Ms. Laguardia questions were answered to her satisfaction today. Our contact information was provided should additional questions or concerns arise. Thank you for the referral and allowing us  to share in the care of your patient.   Ailee Pates M. Ora Billing, MS, [facility] Genetic Counselor [email or web address] (P) [phone]   30 minutes were spent on the date of the encounter in service to the patient including preparation, face-to-face consultation, documentation and care coordination.  Dr. Gudena was available to discuss this case as needed.    _______________________________________________________________________ For Office Staff:  Number of people involved in session: 2 Was an Intern/ student involved with case: no

## 2024-05-15 ENCOUNTER — Ambulatory Visit (HOSPITAL_COMMUNITY)
Admission: RE | Admit: 2024-05-15 | Discharge: 2024-05-15 | Disposition: A | Discharge status: Home / Self Care | Source: Ambulatory Visit | Attending: Hematology and Oncology | Admitting: Hematology and Oncology

## 2024-05-15 DIAGNOSIS — C50312 Malignant neoplasm of lower-inner quadrant of left female breast: Secondary | ICD-10-CM | POA: Insufficient documentation

## 2024-05-15 DIAGNOSIS — Z170 Estrogen receptor positive status [ER+]: Secondary | ICD-10-CM | POA: Insufficient documentation

## 2024-05-15 MED ORDER — GADOBUTROL 1 MMOL/ML IV SOLN
5.0000 mL | Freq: Once | INTRAVENOUS | Status: AC | PRN
  Administered 2024-05-15: 5 mL via INTRAVENOUS

## 2024-05-21 ENCOUNTER — Telehealth: Payer: Self-pay | Admitting: *Deleted

## 2024-05-21 ENCOUNTER — Encounter: Payer: Self-pay | Admitting: *Deleted

## 2024-05-21 ENCOUNTER — Other Ambulatory Visit: Payer: Self-pay | Admitting: *Deleted

## 2024-05-21 ENCOUNTER — Other Ambulatory Visit: Payer: Self-pay | Admitting: Hematology and Oncology

## 2024-05-21 DIAGNOSIS — R928 Other abnormal and inconclusive findings on diagnostic imaging of breast: Secondary | ICD-10-CM

## 2024-05-21 DIAGNOSIS — Z170 Estrogen receptor positive status [ER+]: Secondary | ICD-10-CM

## 2024-05-21 NOTE — Telephone Encounter (Signed)
 Spoke with patient to follow up from [facility] 5/21 and assess navigation needs.  Reviewed MR findings and the need for bx's on the right breast and bone scan. Informed patient she would get a call to get these scheduled.  Patient verbalized understanding.

## 2024-05-22 ENCOUNTER — Encounter: Payer: Self-pay | Admitting: *Deleted

## 2024-05-25 ENCOUNTER — Ambulatory Visit
Admission: RE | Admit: 2024-05-25 | Discharge: 2024-05-25 | Disposition: A | Discharge status: Home / Self Care | Source: Ambulatory Visit | Attending: Hematology and Oncology | Admitting: Hematology and Oncology

## 2024-05-25 ENCOUNTER — Encounter (HOSPITAL_BASED_OUTPATIENT_CLINIC_OR_DEPARTMENT_OTHER): Payer: Self-pay | Admitting: General Surgery

## 2024-05-25 ENCOUNTER — Other Ambulatory Visit (HOSPITAL_COMMUNITY): Payer: Self-pay | Admitting: Diagnostic Radiology

## 2024-05-25 ENCOUNTER — Other Ambulatory Visit: Payer: Self-pay

## 2024-05-25 DIAGNOSIS — C50312 Malignant neoplasm of lower-inner quadrant of left female breast: Secondary | ICD-10-CM

## 2024-05-25 DIAGNOSIS — R928 Other abnormal and inconclusive findings on diagnostic imaging of breast: Secondary | ICD-10-CM

## 2024-05-25 MED ORDER — GADOPICLENOL 0.5 MMOL/ML IV SOLN
6.0000 mL | Freq: Once | INTRAVENOUS | Status: AC | PRN
  Administered 2024-05-25: 6 mL via INTRAVENOUS

## 2024-05-26 ENCOUNTER — Ambulatory Visit: Payer: Self-pay | Admitting: General Surgery

## 2024-05-26 ENCOUNTER — Ambulatory Visit: Payer: Self-pay | Admitting: Genetic Counselor

## 2024-05-26 ENCOUNTER — Ambulatory Visit: Admitting: Plastic Surgery

## 2024-05-26 ENCOUNTER — Encounter: Payer: Self-pay | Admitting: Plastic Surgery

## 2024-05-26 ENCOUNTER — Encounter: Payer: Self-pay | Admitting: Genetic Counselor

## 2024-05-26 ENCOUNTER — Other Ambulatory Visit: Payer: Self-pay | Admitting: *Deleted

## 2024-05-26 ENCOUNTER — Encounter: Payer: Self-pay | Admitting: *Deleted

## 2024-05-26 VITALS — BP 137/86 | HR 113 | Ht 65.0 in | Wt 120.4 lb

## 2024-05-26 DIAGNOSIS — Z8041 Family history of malignant neoplasm of ovary: Secondary | ICD-10-CM

## 2024-05-26 DIAGNOSIS — C50312 Malignant neoplasm of lower-inner quadrant of left female breast: Secondary | ICD-10-CM

## 2024-05-26 DIAGNOSIS — Z170 Estrogen receptor positive status [ER+]: Secondary | ICD-10-CM

## 2024-05-26 DIAGNOSIS — C50411 Malignant neoplasm of upper-outer quadrant of right female breast: Secondary | ICD-10-CM

## 2024-05-26 DIAGNOSIS — Z800 Family history of malignant neoplasm of digestive organs: Secondary | ICD-10-CM

## 2024-05-26 DIAGNOSIS — Z1379 Encounter for other screening for genetic and chromosomal anomalies: Secondary | ICD-10-CM | POA: Insufficient documentation

## 2024-05-26 DIAGNOSIS — Z803 Family history of malignant neoplasm of breast: Secondary | ICD-10-CM

## 2024-05-26 LAB — SURGICAL PATHOLOGY

## 2024-05-26 NOTE — Progress Notes (Signed)
 HPI:   [name] was previously seen in the [facility] clinic due to a personal history of breast cancer and concerns regarding a hereditary predisposition to cancer.    [name] recent genetic test results were disclosed to her by telephone. These results and recommendations are discussed in more detail below.  CANCER HISTORY:  In May 2025, at the age of 78, [name] was diagnosed with invasive ductal carcinoma of the left breast (ER+/PR+/HER2-). The treatment plan is pending.  In 2007, at the age of 58, [name] was diagnosed with right breast cancer, treated with lumpectomy and adjuvant radiation.      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: Lumpectomy radiation declined tamoxifen Screening mammogram detected left breast mass 6:00: 0.9 cm, additional mass 0.6 cm at 10:00, 3 additional masses 0.6 cm 0.4 cm and 0.7 cm (benign concordant), biopsy revealed grade 2 IDC ER 100% PR 95% Ki67 1%, HER2 negative by FISH   05/13/2024 Cancer Staging   Staging form: Breast, AJCC 8th Edition - Clinical stage from 05/13/2024: Stage IA (cT1b, cN0, cM0, G2, ER+, PR+, HER2-) - Signed by Cameron Cea, MD on 05/13/2024 Stage prefix: Initial diagnosis Histologic grading system: 3 grade system Laterality: Left Staged by: Pathologist and managing physician Stage used in treatment planning: Yes National guidelines used in treatment planning: Yes Type of national guideline used in treatment planning: NCCN   05/24/2024 Genetic Testing   Negative [facility] CancerNext-Expanded +RNAinsight Panel.  Report date is 05/24/2024.   The CancerNext-Expanded gene panel offered by [facility] and includes sequencing, rearrangement, and RNA analysis for the following 77 genes: AIP, ALK, APC, ATM, AXIN2, BAP1, BARD1, BMPR1A, BRCA1, BRCA2, BRIP1, CDC73, CDH1, CDK4,  CDKN1B, CDKN2A, CEBPA, CHEK2, CTNNA1, DDX41, DICER1, ETV6, FH, FLCN, GATA2, LZTR1, MAX, MBD4, MEN1, MET, MLH1, MSH2, MSH3, MSH6, MUTYH, NF1, NF2, NTHL1, PALB2, PHOX2B, PMS2, POT1, PRKAR1A, PTCH1, PTEN, RAD51C, RAD51D, RB1, RET, RUNX1, RSP20, SDHA, SDHAF2, SDHB, SDHC, SDHD, SMAD4, SMARCA4, SMARCB1, SMARCE1, STK11, SUFU, TMEM127, TP53, TSC1, TSC2, VHL, and WT1 (sequencing and deletion/duplication); EGFR, HOXB13, KIT, MITF, PDGFRA, POLD1, and POLE (sequencing only); EPCAM and GREM1 (deletion/duplication only).      FAMILY HISTORY:  We obtained a detailed, 4-generation family history.  Significant diagnoses are listed below:      Family History  Problem Relation Age of Onset   Cancer Mother          unknown primary; "chest"?   Colon cancer Father 81   Breast cancer Paternal Aunt 44   Cancer Paternal Aunt          unknown type; dx >50   Lung cancer Paternal Aunt          oat cell   Lung cancer Paternal Uncle     Ovarian cancer Maternal Grandmother          dx >50   Cancer Paternal Grandfather          unknown type; dx >50   Ovarian cancer Cousin          dx 79s; pat cousin   Lung cancer Cousin          pat female cousin     [name] thinks her daughter had negative genetic testing at the time of her breast cancer diagnosis.  No report was available for review today.     There is no  reported Ashkenazi Jewish ancestry. There is no known consanguinity.    GENETIC TEST RESULTS:  The [facility] CancerNext-Expanded +RNAinsight Panel found no pathogenic mutations.   The CancerNext-Expanded gene panel offered by [facility] and includes sequencing, rearrangement, and RNA analysis for the following 77 genes: AIP, ALK, APC, ATM, AXIN2, BAP1, BARD1, BMPR1A, BRCA1, BRCA2, BRIP1, CDC73, CDH1, CDK4, CDKN1B, CDKN2A, CEBPA, CHEK2, CTNNA1, DDX41, DICER1, ETV6, FH, FLCN, GATA2, LZTR1, MAX, MBD4, MEN1, MET, MLH1, MSH2, MSH3, MSH6, MUTYH, NF1, NF2, NTHL1, PALB2, PHOX2B, PMS2, POT1, PRKAR1A, PTCH1, PTEN,  RAD51C, RAD51D, RB1, RET, RUNX1, RSP20, SDHA, SDHAF2, SDHB, SDHC, SDHD, SMAD4, SMARCA4, SMARCB1, SMARCE1, STK11, SUFU, TMEM127, TP53, TSC1, TSC2, VHL, and WT1 (sequencing and deletion/duplication); EGFR, HOXB13, KIT, MITF, PDGFRA, POLD1, and POLE (sequencing only); EPCAM and GREM1 (deletion/duplication only).    The test report has been scanned into EPIC and is located under the Molecular Pathology section of the Results Review tab.  A portion of the result report is included below for reference. Genetic testing reported out on May 24, 2024.       Even though a pathogenic variant was not identified, possible explanations for the cancer in the family may include: There may be no hereditary risk for cancer in the family. The cancers in [name] and/or her family may be sporadic/familial or due to other genetic and environmental factors.  Most cancer is not hereditary.  There may be a gene mutation in one of these genes that current testing methods cannot detect but that chance is small. There could be another gene that has not yet been discovered, or that we have not yet tested, that is responsible for the cancer diagnoses in the family.  It is also possible there is a hereditary cause for the cancer in the family that [name] did not inherit.   Therefore, it is important to remain in touch with cancer genetics in the future so that we can continue to offer [name] the most up to date genetic testing.    ADDITIONAL GENETIC TESTING:   [name] genetic testing was fairly extensive.  If there are additional relevant genes identified to increase cancer risk that can be analyzed in the future, we would be happy to discuss and coordinate this testing at that time.    CANCER SCREENING RECOMMENDATIONS:  [name] test result is considered negative (normal).  This means that we have not identified a hereditary cause for her personal history of breast cancer at this time.   An  individual's cancer risk and medical management are not determined by genetic test results alone. Overall cancer risk assessment incorporates additional factors, including personal medical history, family history, and any available genetic information that may result in a personalized plan for cancer prevention and surveillance. Therefore, it is recommended she continue to follow the cancer management and screening guidelines provided by her oncology and primary healthcare provider.    RECOMMENDATIONS FOR FAMILY MEMBERS:   Since she did not inherit a identifiable mutation in a cancer predisposition gene included on this panel, her children could not have inherited a known mutation from her in one of these genes. Individuals in this family might be at some increased risk of developing cancer, over the general population risk, due to the family history of cancer.  Individuals in the family should notify their providers of the family history of cancer. We recommend women in this family have a yearly mammogram beginning at age 8, or 13 years younger than the earliest  onset of cancer, an annual clinical breast exam, and perform monthly breast self-exams.  Risk models that take into account family history and hormonal history may be helpful in determining appropriate breast cancer screening options for family members.  First degree relatives of those with colon cancer should receive colonoscopies beginning at age 11, or 10 years prior to the earliest diagnosis of colon cancer in the family, and receive colonoscopies at least every 5 years, or as recommended by their gastroenterologist.   Other members of the family may still carry a pathogenic variant in one of these genes that [name] did not inherit. Based on the family history, we recommend first degree relatives of who have had breast and ovarian cancer in the family (now deceased), have genetic counseling and testing. [name] can let us  know if we can  be of any assistance in coordinating genetic counseling and/or testing for these family members.  Her daughter should keep in touch with her genetics providers to ensure her testing to up to date.    FOLLOW-UP:  Cancer genetics is a rapidly advancing field and it is possible that new genetic tests will be appropriate for her and/or her family members in the future. We encourage [name] to remain in contact with cancer genetics, so we can update her personal and family histories and let her know of advances in cancer genetics that may benefit this family.   Our contact number was provided.  They are welcome to call us  at anytime with additional questions or concerns.   Lorayne Getchell M. Ora Billing, MS, [facility] Genetic Counselor [email or web address] (P) [phone]

## 2024-05-26 NOTE — Progress Notes (Signed)
 Patient ID: [name], female    DOB: 01/06/1946, 78 y.o.   MRN: [id number]   Chief Complaint  Patient presents with   Advice Only    The patient is a 78 year old female here for evaluation and consultation for breast reconstruction.  She is 5 feet 5 inches tall and weighs 120 pounds.  She is around an A or B cup sized bra.  She smokes 3 to 4 packs/day.  The patient was diagnosed with left breast cancer in the lower inner quadrant.  It is estrogen and progesterone positive, HER2 negative, grade 2.  The right breast imaging is pending.  Her past medical history is positive for hyperglycemia, hiatal hernia, depression, right breast cancer with radiation and bipolar.  She is also had a pubic ramus fracture.  Past surgical history includes lumbar fusion, cataract surgery, elbow surgery, carpal tunnel syndrome, appendectomy and right breast surgery.  Patient's support has been her ex-husband.    Review of Systems  Constitutional: Negative.   HENT: Negative.    Eyes: Negative.   Respiratory: Negative.    Cardiovascular: Negative.   Gastrointestinal: Negative.   Endocrine: Negative.   Genitourinary: Negative.   Musculoskeletal: Negative.     Past Medical History:  Diagnosis Date   Allergy    Anemia    Anxiety    Arthritis    Astigmatism    Bursitis    Bursitis of hip    Cancer (HCC) 10/2006   Invasive ductal carcinoma of the right breast    Carpal tunnel syndrome    Cataracts, bilateral    Closed fracture of multiple pubic rami, left, initial encounter (HCC)    Colitis    Depression    Drusen of both optic discs    Endometriosis    Fibromyalgia    Gall stones    GERD (gastroesophageal reflux disease)    Hyperlipidemia    Pneumonia    Tendon tear     Past Surgical History:  Procedure Laterality Date   APPENDECTOMY     BREAST SURGERY Right 12/11/2006   Lumpectomy   CARPAL TUNNEL RELEASE     CYST EXCISION     ELBOW SURGERY     LUMBAR FUSION        Current  Outpatient Medications:    cetirizine (ZYRTEC) 10 MG tablet, Take 10 mg by mouth daily.  , Disp: , Rfl:    escitalopram  (LEXAPRO ) 20 MG tablet, Take 20 mg by mouth daily. , Disp: , Rfl:    lansoprazole (PREVACID) 15 MG capsule, Take 15 mg by mouth daily. , Disp: , Rfl:    LORazepam  (ATIVAN ) 0.5 MG tablet, Take 1 tablet (0.5 mg total) by mouth 2 (two) times daily., Disp: 10 tablet, Rfl: 0   mirtazapine  (REMERON ) 45 MG tablet, Take 45 mg by mouth at bedtime., Disp: , Rfl:    ondansetron  (ZOFRAN ) 8 MG tablet, Take 8 mg by mouth every 8 (eight) hours as needed for nausea or vomiting., Disp: , Rfl:    QUEtiapine  (SEROQUEL ) 100 MG tablet, Take 150 mg by mouth at bedtime. , Disp: , Rfl:    simethicone  (GAS-X) 80 MG chewable tablet, Chew 1 tablet (80 mg total) by mouth 4 (four) times daily as needed for flatulence., Disp: , Rfl:    simvastatin  (ZOCOR ) 20 MG tablet, Take 20 mg by mouth at bedtime.  , Disp: , Rfl:    Valbenazine Tosylate (INGREZZA PO), Take by mouth., Disp: , Rfl:    acetaminophen  (  TYLENOL ) 325 MG tablet, Take 2 tablets (650 mg total) by mouth every 6 (six) hours as needed for mild pain (or Fever >/= 101). (Patient not taking: Reported on 05/26/2024), Disp: , Rfl:    albuterol  (PROAIR  HFA) 108 (90 Base) MCG/ACT inhaler, Inhale 2 puffs into the lungs every 4 (four) hours as needed for wheezing or shortness of breath. (Patient not taking: Reported on 05/26/2024), Disp: 1 Inhaler, Rfl: 1   benztropine  (COGENTIN ) 1 MG tablet, , Disp: , Rfl:    sucralfate  (CARAFATE ) 1 GM/10ML suspension, Take 10 mLs (1 g total) by mouth 4 (four) times daily -  with meals and at bedtime. (Patient not taking: Reported on 05/26/2024), Disp: 420 mL, Rfl: 0   Vitamin D , Ergocalciferol , (DRISDOL ) 50000 units CAPS capsule, Take 50,000 Units by mouth every 7 (seven) days. wednesday (Patient not taking: Reported on 05/26/2024), Disp: , Rfl:    Objective:   Vitals:   05/26/24 0900  BP: 137/86  Pulse: (!) 113  SpO2: 95%     Physical Exam Vitals reviewed.  HENT:     Head: Atraumatic.  Cardiovascular:     Rate and Rhythm: Normal rate.     Pulses: Normal pulses.  Pulmonary:     Effort: Pulmonary effort is normal.  Abdominal:     Palpations: Abdomen is soft.  Skin:    General: Skin is warm.     Capillary Refill: Capillary refill takes less than 2 seconds.  Neurological:     Mental Status: She is alert and oriented to person, place, and time.  Psychiatric:        Mood and Affect: Mood normal.        Behavior: Behavior normal.        Thought Content: Thought content normal.        Judgment: Judgment normal.     Assessment & Plan:  No diagnosis found.  The options for reconstruction we explained to the patient / family for breast reconstruction.  There are two general categories of reconstruction.  We can reconstruction a breast with implants or use the patient's own tissue.  These were further discussed as listed.  Breast reconstruction is an optional procedure and eligibility depends on the full spectrum of the health of the patient and any co-morbidities.  More than one surgery is often needed to complete the reconstruction process.  The process can take three to twelve months to complete.  The breasts will not be identical due to many factors such as rib differences, shoulder asymmetry and treatments such as radiation.  The goal is to get the breasts to look normal and symmetrical in clothes.  Scars are a part of surgery and may fade some in time but will always be present under clothes.  Surgery may be an option on the non-cancer breast to achieve more symmetry.  No matter which procedure is chosen there is always the risk of complications and even failure of the body to heal.  This could result in no breast.    The options for reconstruction include the following but we did not get to them for her:  1. Placement of a tissue expander with Acellular dermal matrix. When the expander is the desired size  surgery is performed to remove the expander and place an implant.  In some cases the implant can be placed without an expander.  2. Autologous reconstruction can include using a muscle or tissue from another area of the body to create a breast.  3.  Combined procedures (ie. latissismus dorsi flap) can be done with an expander / implant placed under the muscle.   The risks, benefits, scars and recovery time were discussed for each of the above. Risks include bleeding, infection, hematoma, seroma, scarring, pain, wound healing complications, flap loss, fat necrosis, capsular contracture, need for implant removal, donor site complications, bulge, hernia, umbilical necrosis, need for urgent reoperation, and need for dressing changes.   The procedure the patient selected / that was best for the patient, was then discussed in further detail.  Total time: 45 minutes. This includes time spent with the patient during the visit as well as time spent before and after the visit reviewing the chart, documenting the encounter, making phone calls and reviewing studies.   Patient is unable or unwilling to stop smoking.  She has decided to forego reconstruction based on the high risks of complication.  Pictures were obtained of the patient and placed in the chart with the patient's or guardian's permission.   Lindaann Requena Kia Stavros, DO

## 2024-05-27 ENCOUNTER — Encounter (HOSPITAL_COMMUNITY)
Admission: RE | Admit: 2024-05-27 | Discharge: 2024-05-27 | Disposition: A | Discharge status: Home / Self Care | Source: Ambulatory Visit | Attending: Hematology and Oncology | Admitting: Hematology and Oncology

## 2024-05-27 DIAGNOSIS — R928 Other abnormal and inconclusive findings on diagnostic imaging of breast: Secondary | ICD-10-CM | POA: Diagnosis present

## 2024-05-27 DIAGNOSIS — Z170 Estrogen receptor positive status [ER+]: Secondary | ICD-10-CM | POA: Diagnosis present

## 2024-05-27 DIAGNOSIS — C50312 Malignant neoplasm of lower-inner quadrant of left female breast: Secondary | ICD-10-CM | POA: Diagnosis present

## 2024-05-27 MED ORDER — CHLORHEXIDINE GLUCONATE CLOTH 2 % EX PADS: 6.0000 | MEDICATED_PAD | Freq: Once | CUTANEOUS | Status: DC

## 2024-05-27 MED ORDER — TECHNETIUM TC 99M MEDRONATE IV KIT
21.3000 | PACK | Freq: Once | INTRAVENOUS | Status: AC
  Administered 2024-05-27: 21.3 via INTRAVENOUS

## 2024-05-27 NOTE — Progress Notes (Signed)
      Enhanced Recovery after Surgery  Enhanced Recovery after Surgery is a protocol used to improve the stress on your body and your recovery after surgery.  Patient Instructions  The night before surgery:  No food after midnight. ONLY clear liquids after midnight  The day of surgery (if you do NOT have diabetes):  Drink ONE (1) Pre-Surgery Clear Ensure as directed.   This drink was given to you during your hospital  pre-op appointment visit. The pre-op nurse will instruct you on the time to drink the  Pre-Surgery Ensure depending on your surgery time. Finish the drink at the designated time by the pre-op nurse.  Nothing else to drink after completing the  Pre-Surgery Clear Ensure.  The day of surgery (if you have diabetes): Drink ONE (1) Gatorade 2 (G2) as directed. This drink was given to you during your hospital  pre-op appointment visit.  The pre-op nurse will instruct you on the time to drink the   Gatorade 2 (G2) depending on your surgery time. Color of the Gatorade may vary. Red is not allowed. Nothing else to drink after completing the  Gatorade 2 (G2).         If you have questions, please contact your surgeon's office. Pre op soap given with instructions

## 2024-05-28 ENCOUNTER — Encounter: Payer: Self-pay | Admitting: *Deleted

## 2024-05-28 ENCOUNTER — Encounter: Payer: Self-pay | Admitting: General Practice

## 2024-05-28 NOTE — Progress Notes (Signed)
 [facility] Spiritual Care Note  Followed up with [name] by phone to wish her well through her procedure tomorrow. She reports that her "nerves are shot" with the anxiety of anticipation, but she has had some helpful support from her ex-husband and is sleeping well, thanks to medication. She welcomes a follow-up call late next week to process how she is recovering.   [address] Dorice Gardner, [place], [facility] Pager [phone] Voicemail [phone]

## 2024-05-29 ENCOUNTER — Other Ambulatory Visit: Payer: Self-pay

## 2024-05-29 ENCOUNTER — Inpatient Hospital Stay (HOSPITAL_COMMUNITY): Admitting: Anesthesiology

## 2024-05-29 ENCOUNTER — Ambulatory Visit (HOSPITAL_BASED_OUTPATIENT_CLINIC_OR_DEPARTMENT_OTHER)
Admission: RE | Admit: 2024-05-29 | Discharge: 2024-05-30 | Disposition: A | Discharge status: Home / Self Care | Attending: General Surgery | Admitting: General Surgery

## 2024-05-29 ENCOUNTER — Ambulatory Visit (HOSPITAL_COMMUNITY)

## 2024-05-29 ENCOUNTER — Encounter (HOSPITAL_COMMUNITY): Payer: Self-pay | Admitting: General Surgery

## 2024-05-29 ENCOUNTER — Encounter (HOSPITAL_COMMUNITY)
Admission: RE | Disposition: A | Discharge status: Home / Self Care | Payer: Self-pay | Source: Home / Self Care | Attending: General Surgery

## 2024-05-29 ENCOUNTER — Ambulatory Visit (HOSPITAL_COMMUNITY)
Admission: RE | Admit: 2024-05-29 | Discharge: 2024-05-29 | Disposition: A | Discharge status: Home / Self Care | Source: Ambulatory Visit | Attending: General Surgery | Admitting: General Surgery

## 2024-05-29 DIAGNOSIS — Z170 Estrogen receptor positive status [ER+]: Secondary | ICD-10-CM | POA: Insufficient documentation

## 2024-05-29 DIAGNOSIS — F419 Anxiety disorder, unspecified: Secondary | ICD-10-CM | POA: Insufficient documentation

## 2024-05-29 DIAGNOSIS — C50411 Malignant neoplasm of upper-outer quadrant of right female breast: Secondary | ICD-10-CM | POA: Insufficient documentation

## 2024-05-29 DIAGNOSIS — Z171 Estrogen receptor negative status [ER-]: Secondary | ICD-10-CM | POA: Insufficient documentation

## 2024-05-29 DIAGNOSIS — C50911 Malignant neoplasm of unspecified site of right female breast: Secondary | ICD-10-CM | POA: Diagnosis not present

## 2024-05-29 DIAGNOSIS — Z923 Personal history of irradiation: Secondary | ICD-10-CM | POA: Insufficient documentation

## 2024-05-29 DIAGNOSIS — F17210 Nicotine dependence, cigarettes, uncomplicated: Secondary | ICD-10-CM | POA: Diagnosis not present

## 2024-05-29 DIAGNOSIS — Z1732 Human epidermal growth factor receptor 2 negative status: Secondary | ICD-10-CM | POA: Insufficient documentation

## 2024-05-29 DIAGNOSIS — F319 Bipolar disorder, unspecified: Secondary | ICD-10-CM | POA: Insufficient documentation

## 2024-05-29 DIAGNOSIS — C50312 Malignant neoplasm of lower-inner quadrant of left female breast: Secondary | ICD-10-CM

## 2024-05-29 DIAGNOSIS — Z1722 Progesterone receptor negative status: Secondary | ICD-10-CM | POA: Diagnosis not present

## 2024-05-29 DIAGNOSIS — Z1721 Progesterone receptor positive status: Secondary | ICD-10-CM | POA: Diagnosis not present

## 2024-05-29 DIAGNOSIS — C50812 Malignant neoplasm of overlapping sites of left female breast: Secondary | ICD-10-CM | POA: Diagnosis present

## 2024-05-29 DIAGNOSIS — K219 Gastro-esophageal reflux disease without esophagitis: Secondary | ICD-10-CM | POA: Insufficient documentation

## 2024-05-29 DIAGNOSIS — C50912 Malignant neoplasm of unspecified site of left female breast: Secondary | ICD-10-CM | POA: Diagnosis not present

## 2024-05-29 DIAGNOSIS — Z01818 Encounter for other preprocedural examination: Principal | ICD-10-CM

## 2024-05-29 HISTORY — PX: MASTECTOMY W/ SENTINEL NODE BIOPSY: SHX2001

## 2024-05-29 SURGERY — MASTECTOMY WITH SENTINEL LYMPH NODE BIOPSY
Anesthesia: General | Site: Breast | Laterality: Bilateral

## 2024-05-29 MED ORDER — ESCITALOPRAM OXALATE 20 MG PO TABS
20.0000 mg | ORAL_TABLET | Freq: Every day | ORAL | Status: DC
  Administered 2024-05-30: 20 mg via ORAL
  Filled 2024-05-29: qty 1

## 2024-05-29 MED ORDER — LIDOCAINE 2% (20 MG/ML) 5 ML SYRINGE
INTRAMUSCULAR | Status: DC | PRN
  Administered 2024-05-29: 60 mg via INTRAVENOUS

## 2024-05-29 MED ORDER — PANTOPRAZOLE SODIUM 40 MG IV SOLR
40.0000 mg | Freq: Every day | INTRAVENOUS | Status: DC
  Filled 2024-05-29: qty 10

## 2024-05-29 MED ORDER — DROPERIDOL 2.5 MG/ML IJ SOLN: 0.6250 mg | Freq: Once | INTRAMUSCULAR | Status: DC | PRN

## 2024-05-29 MED ORDER — OXYCODONE HCL 5 MG PO TABS
5.0000 mg | ORAL_TABLET | ORAL | Status: DC | PRN
  Administered 2024-05-29: 5 mg via ORAL
  Administered 2024-05-30: 10 mg via ORAL
  Filled 2024-05-29 (×2): qty 1
  Filled 2024-05-29: qty 2

## 2024-05-29 MED ORDER — EPHEDRINE SULFATE-NACL 50-0.9 MG/10ML-% IV SOSY
PREFILLED_SYRINGE | INTRAVENOUS | Status: DC | PRN
Start: 2024-05-29 — End: 2024-05-29
  Administered 2024-05-29 (×2): 5 mg via INTRAVENOUS

## 2024-05-29 MED ORDER — BUPROPION HCL ER (XL) 150 MG PO TB24
150.0000 mg | ORAL_TABLET | Freq: Every day | ORAL | Status: DC
  Administered 2024-05-30: 150 mg via ORAL
  Filled 2024-05-29: qty 1

## 2024-05-29 MED ORDER — MIRTAZAPINE 15 MG PO TABS
45.0000 mg | ORAL_TABLET | Freq: Every day | ORAL | Status: DC
  Filled 2024-05-29: qty 3

## 2024-05-29 MED ORDER — QUETIAPINE FUMARATE 50 MG PO TABS
150.0000 mg | ORAL_TABLET | Freq: Every day | ORAL | Status: DC
  Filled 2024-05-29: qty 3

## 2024-05-29 MED ORDER — SUGAMMADEX SODIUM 200 MG/2ML IV SOLN
INTRAVENOUS | Status: DC | PRN
  Administered 2024-05-29: 108 mg via INTRAVENOUS

## 2024-05-29 MED ORDER — ACETAMINOPHEN 500 MG PO TABS: 1000.0000 mg | ORAL_TABLET | ORAL | Status: DC

## 2024-05-29 MED ORDER — FENTANYL CITRATE (PF) 100 MCG/2ML IJ SOLN
INTRAMUSCULAR | Status: AC
  Administered 2024-05-29: 50 ug
  Filled 2024-05-29: qty 2

## 2024-05-29 MED ORDER — PANTOPRAZOLE SODIUM 40 MG IV SOLR
40.0000 mg | Freq: Every day | INTRAVENOUS | Status: DC
  Administered 2024-05-30: 40 mg via INTRAVENOUS
  Filled 2024-05-29: qty 10

## 2024-05-29 MED ORDER — FENTANYL CITRATE (PF) 100 MCG/2ML IJ SOLN: 50.0000 ug | Freq: Once | INTRAMUSCULAR | Status: DC

## 2024-05-29 MED ORDER — HYDROMORPHONE HCL 1 MG/ML IJ SOLN
INTRAMUSCULAR | Status: AC
Start: 2024-05-29 — End: ?
  Filled 2024-05-29: qty 1

## 2024-05-29 MED ORDER — TECHNETIUM TC 99M TILMANOCEPT KIT
1.0000 | PACK | Freq: Once | INTRAVENOUS | Status: AC | PRN
  Administered 2024-05-29: 1 via INTRADERMAL

## 2024-05-29 MED ORDER — ACETAMINOPHEN 500 MG PO TABS
ORAL_TABLET | ORAL | Status: AC
  Administered 2024-05-29: 1000 mg
  Filled 2024-05-29: qty 2

## 2024-05-29 MED ORDER — LORAZEPAM 0.5 MG PO TABS
0.5000 mg | ORAL_TABLET | Freq: Every day | ORAL | Status: DC | PRN
  Administered 2024-05-29 – 2024-05-30 (×2): 0.5 mg via ORAL
  Filled 2024-05-29 (×2): qty 1

## 2024-05-29 MED ORDER — FENTANYL CITRATE (PF) 100 MCG/2ML IJ SOLN
INTRAMUSCULAR | Status: AC
Start: 2024-05-29 — End: ?
  Filled 2024-05-29: qty 2

## 2024-05-29 MED ORDER — VANCOMYCIN HCL IN DEXTROSE 1-5 GM/200ML-% IV SOLN
1000.0000 mg | INTRAVENOUS | Status: AC
  Administered 2024-05-29: 1000 mg via INTRAVENOUS

## 2024-05-29 MED ORDER — HYDROMORPHONE HCL 1 MG/ML IJ SOLN
INTRAMUSCULAR | Status: AC
  Filled 2024-05-29: qty 1

## 2024-05-29 MED ORDER — NICOTINE 14 MG/24HR TD PT24
14.0000 mg | MEDICATED_PATCH | Freq: Every day | TRANSDERMAL | Status: DC
  Administered 2024-05-29 – 2024-05-30 (×2): 14 mg via TRANSDERMAL
  Filled 2024-05-29 (×2): qty 1

## 2024-05-29 MED ORDER — FENTANYL CITRATE (PF) 100 MCG/2ML IJ SOLN
INTRAMUSCULAR | Status: DC | PRN
Start: 2024-05-29 — End: 2024-05-29
  Administered 2024-05-29: 50 ug via INTRAVENOUS
  Administered 2024-05-29: 100 ug via INTRAVENOUS

## 2024-05-29 MED ORDER — 0.9 % SODIUM CHLORIDE (POUR BTL) OPTIME
TOPICAL | Status: DC | PRN
  Administered 2024-05-29: 1000 mL

## 2024-05-29 MED ORDER — ONDANSETRON HCL 4 MG/2ML IJ SOLN
INTRAMUSCULAR | Status: DC | PRN
  Administered 2024-05-29: 4 mg via INTRAVENOUS

## 2024-05-29 MED ORDER — GABAPENTIN 100 MG PO CAPS: 100.0000 mg | ORAL_CAPSULE | ORAL | Status: DC

## 2024-05-29 MED ORDER — BUPIVACAINE LIPOSOME 1.3 % IJ SUSP
INTRAMUSCULAR | Status: DC | PRN
  Administered 2024-05-29 (×2): 10 mL via PERINEURAL

## 2024-05-29 MED ORDER — HYDROMORPHONE HCL 1 MG/ML IJ SOLN: 0.5000 mg | INTRAMUSCULAR | Status: DC | PRN

## 2024-05-29 MED ORDER — GABAPENTIN 100 MG PO CAPS
ORAL_CAPSULE | ORAL | Status: AC
  Administered 2024-05-29: 100 mg
  Filled 2024-05-29: qty 1

## 2024-05-29 MED ORDER — DEXAMETHASONE SODIUM PHOSPHATE 10 MG/ML IJ SOLN
INTRAMUSCULAR | Status: DC | PRN
  Administered 2024-05-29: 10 mg via INTRAVENOUS

## 2024-05-29 MED ORDER — PROPOFOL 500 MG/50ML IV EMUL
INTRAVENOUS | Status: DC | PRN
Start: 2024-05-29 — End: 2024-05-29
  Administered 2024-05-29: 40 ug/kg/min via INTRAVENOUS

## 2024-05-29 MED ORDER — ONDANSETRON HCL 4 MG/2ML IJ SOLN
4.0000 mg | Freq: Four times a day (QID) | INTRAMUSCULAR | Status: DC | PRN
  Administered 2024-05-30: 4 mg via INTRAVENOUS
  Filled 2024-05-29: qty 2

## 2024-05-29 MED ORDER — PANTOPRAZOLE SODIUM 40 MG IV SOLR: 40.0000 mg | INTRAVENOUS | Status: DC

## 2024-05-29 MED ORDER — BUPIVACAINE-EPINEPHRINE (PF) 0.25% -1:200000 IJ SOLN
INTRAMUSCULAR | Status: DC | PRN
  Administered 2024-05-29 (×2): 20 mL via PERINEURAL

## 2024-05-29 MED ORDER — ALBUMIN HUMAN 5 % IV SOLN: INTRAVENOUS | Status: DC | PRN

## 2024-05-29 MED ORDER — FENTANYL CITRATE (PF) 250 MCG/5ML IJ SOLN
INTRAMUSCULAR | Status: AC
Start: 2024-05-29 — End: ?
  Filled 2024-05-29: qty 5

## 2024-05-29 MED ORDER — SODIUM CHLORIDE 0.9 % IV SOLN: INTRAVENOUS | Status: DC

## 2024-05-29 MED ORDER — ROCURONIUM BROMIDE 10 MG/ML (PF) SYRINGE
PREFILLED_SYRINGE | INTRAVENOUS | Status: DC | PRN
  Administered 2024-05-29: 100 mg via INTRAVENOUS

## 2024-05-29 MED ORDER — SIMETHICONE 80 MG PO CHEW: 80.0000 mg | CHEWABLE_TABLET | Freq: Four times a day (QID) | ORAL | Status: DC | PRN

## 2024-05-29 MED ORDER — PROPOFOL 500 MG/50ML IV EMUL: INTRAVENOUS | Status: DC | PRN

## 2024-05-29 MED ORDER — ONDANSETRON 4 MG PO TBDP: 4.0000 mg | ORAL_TABLET | Freq: Four times a day (QID) | ORAL | Status: DC | PRN

## 2024-05-29 MED ORDER — METHOCARBAMOL 500 MG PO TABS: 500.0000 mg | ORAL_TABLET | Freq: Four times a day (QID) | ORAL | Status: DC | PRN

## 2024-05-29 MED ORDER — ENOXAPARIN SODIUM 30 MG/0.3ML IJ SOSY
30.0000 mg | PREFILLED_SYRINGE | INTRAMUSCULAR | Status: DC
Start: 2024-05-30 — End: 2024-05-30
  Administered 2024-05-30: 30 mg via SUBCUTANEOUS
  Filled 2024-05-29: qty 0.3

## 2024-05-29 MED ORDER — SIMVASTATIN 20 MG PO TABS: 20.0000 mg | ORAL_TABLET | Freq: Every day | ORAL | Status: DC

## 2024-05-29 MED ORDER — LACTATED RINGERS IV SOLN: INTRAVENOUS | Status: DC

## 2024-05-29 MED ORDER — TECHNETIUM TC 99M TILMANOCEPT KIT
1.0000 | PACK | Freq: Once | INTRAVENOUS | Status: AC | PRN
Start: 2024-05-29 — End: 2024-05-29
  Administered 2024-05-29: 1 via INTRADERMAL

## 2024-05-29 MED ORDER — HYDROMORPHONE HCL 1 MG/ML IJ SOLN
0.2500 mg | INTRAMUSCULAR | Status: DC | PRN
  Administered 2024-05-29 (×4): 0.5 mg via INTRAVENOUS

## 2024-05-29 MED ORDER — VANCOMYCIN HCL IN DEXTROSE 1-5 GM/200ML-% IV SOLN
INTRAVENOUS | Status: AC
  Filled 2024-05-29: qty 200

## 2024-05-29 MED ORDER — VANCOMYCIN HCL IN DEXTROSE 1-5 GM/200ML-% IV SOLN: 1000.0000 mg | INTRAVENOUS | Status: DC

## 2024-05-29 MED ORDER — PROPOFOL 10 MG/ML IV BOLUS
INTRAVENOUS | Status: DC | PRN
  Administered 2024-05-29: 20 mg via INTRAVENOUS
  Administered 2024-05-29: 30 mg via INTRAVENOUS
  Administered 2024-05-29: 40 mg via INTRAVENOUS
  Administered 2024-05-29: 20 mg via INTRAVENOUS
  Administered 2024-05-29: 80 mg via INTRAVENOUS

## 2024-05-29 MED ORDER — MORPHINE SULFATE (PF) 2 MG/ML IV SOLN
1.0000 mg | INTRAVENOUS | Status: DC | PRN
  Administered 2024-05-29 – 2024-05-30 (×2): 2 mg via INTRAVENOUS
  Filled 2024-05-29 (×2): qty 1

## 2024-05-29 SURGICAL SUPPLY — 48 items
BAG COUNTER SPONGE SURGICOUNT (BAG) ×1 IMPLANT
BINDER BREAST LRG (GAUZE/BANDAGES/DRESSINGS) IMPLANT
BINDER BREAST XLRG (GAUZE/BANDAGES/DRESSINGS) IMPLANT
BIOPATCH RED 1 DISK 7.0 (GAUZE/BANDAGES/DRESSINGS) ×1 IMPLANT
CANISTER SUCTION 3000ML PPV (SUCTIONS) ×1 IMPLANT
CHLORAPREP W/TINT 26 (MISCELLANEOUS) ×1 IMPLANT
CLIP APPLIE 9.375 MED OPEN (MISCELLANEOUS) ×1 IMPLANT
CNTNR URN SCR LID CUP LEK RST (MISCELLANEOUS) ×1 IMPLANT
COVER PROBE W GEL 5X96 (DRAPES) ×1 IMPLANT
COVER SURGICAL LIGHT HANDLE (MISCELLANEOUS) ×1 IMPLANT
DERMABOND ADVANCED .7 DNX12 (GAUZE/BANDAGES/DRESSINGS) ×1 IMPLANT
DEVICE DSSCT PLSMBLD 3.0S LGHT (MISCELLANEOUS) ×1 IMPLANT
DRAIN CHANNEL 19F RND (DRAIN) ×1 IMPLANT
DRAPE CHEST BREAST 15X10 FENES (DRAPES) ×1 IMPLANT
DRSG TEGADERM 4X4.75 (GAUZE/BANDAGES/DRESSINGS) ×1 IMPLANT
ELECT CAUTERY BLADE 6.4 (BLADE) ×1 IMPLANT
ELECTRODE REM PT RTRN 9FT ADLT (ELECTROSURGICAL) ×1 IMPLANT
EVACUATOR SILICONE 100CC (DRAIN) ×1 IMPLANT
FILTER IN LINE W/DETACHED HOSE (FILTER) ×1 IMPLANT
GAUZE PAD ABD 8X10 STRL (GAUZE/BANDAGES/DRESSINGS) ×1 IMPLANT
GAUZE SPONGE 4X4 12PLY STRL (GAUZE/BANDAGES/DRESSINGS) IMPLANT
GLOVE BIO SURGEON STRL SZ7.5 (GLOVE) ×1 IMPLANT
GOWN STRL REUS W/ TWL LRG LVL3 (GOWN DISPOSABLE) ×2 IMPLANT
KIT BASIN OR (CUSTOM PROCEDURE TRAY) ×1 IMPLANT
KIT TURNOVER KIT B (KITS) ×1 IMPLANT
LIGHT WAVEGUIDE WIDE FLAT (MISCELLANEOUS) IMPLANT
NDL 18GX1X1/2 (RX/OR ONLY) (NEEDLE) IMPLANT
NDL FILTER BLUNT 18X1 1/2 (NEEDLE) IMPLANT
NDL HYPO 25GX1X1/2 BEV (NEEDLE) IMPLANT
NEEDLE 18GX1X1/2 (RX/OR ONLY) (NEEDLE) IMPLANT
NEEDLE FILTER BLUNT 18X1 1/2 (NEEDLE) IMPLANT
NEEDLE HYPO 25GX1X1/2 BEV (NEEDLE) IMPLANT
NS IRRIG 1000ML POUR BTL (IV SOLUTION) ×1 IMPLANT
PACK GENERAL/GYN (CUSTOM PROCEDURE TRAY) ×1 IMPLANT
PAD ARMBOARD POSITIONER FOAM (MISCELLANEOUS) ×1 IMPLANT
PENCIL SMOKE EVACUATOR (MISCELLANEOUS) ×1 IMPLANT
SPECIMEN JAR X LARGE (MISCELLANEOUS) ×1 IMPLANT
SUT ETHILON 2 0 FS 18 (SUTURE) IMPLANT
SUT ETHILON 3 0 FSL (SUTURE) ×1 IMPLANT
SUT MNCRL AB 4-0 PS2 18 (SUTURE) ×1 IMPLANT
SUT VIC AB 0 CT1 27XBRD ANBCTR (SUTURE) ×2 IMPLANT
SUT VIC AB 3-0 54X BRD REEL (SUTURE) IMPLANT
SUT VIC AB 3-0 SH 18 (SUTURE) ×1 IMPLANT
SUT VIC AB 3-0 SH 8-18 (SUTURE) IMPLANT
SYR CONTROL 10ML LL (SYRINGE) IMPLANT
TOWEL GREEN STERILE (TOWEL DISPOSABLE) ×1 IMPLANT
TOWEL GREEN STERILE FF (TOWEL DISPOSABLE) ×1 IMPLANT
TUBE CONNECTING 12X1/4 (SUCTIONS) IMPLANT

## 2024-05-29 NOTE — Interval H&P Note (Signed)
 History and Physical Interval Note:  05/29/2024 9:54 AM  [name]  has presented today for surgery, with the diagnosis of BILATERAL BREAST CANCERS.  The various methods of treatment have been discussed with the patient and family. After consideration of risks, benefits and other options for treatment, the patient has consented to  Procedure(s) with comments: MASTECTOMY WITH SENTINEL LYMPH NODE BIOPSY (Bilateral) - 150 MINUTES BILATERAL MASTECTOMIES BILATERAL SENTINEL NODE BIOPSIES GEN w/PEC BLOCK as a surgical intervention.  The patient's history has been reviewed, patient examined, no change in status, stable for surgery.  I have reviewed the patient's chart and labs.  Questions were answered to the patient's satisfaction.     [name]

## 2024-05-29 NOTE — Op Note (Signed)
 05/29/2024  1:04 PM  PATIENT:  [name]  78 y.o. female  PRE-OPERATIVE DIAGNOSIS:  BILATERAL BREAST CANCERS  POST-OPERATIVE DIAGNOSIS:  BILATERAL BREAST CANCERS  PROCEDURE:  Procedure(s) with comments: BILATERAL MASTECTOMIES WITH BILATERAL DEEP AXILLARY SENTINEL LYMPH NODE BIOPSIES   SURGEON:  Surgeons and Role:    * Caralyn Chandler, MD - Primary  PHYSICIAN ASSISTANT:   ASSISTANTS: none   ANESTHESIA:   general  EBL:  20 mL   BLOOD ADMINISTERED:none  DRAINS: (2) Blake drain(s) in the prepectoral space   LOCAL MEDICATIONS USED:  NONE  SPECIMEN:  Source of Specimen:  bilateral mastectomies with right and left sentinel node x 2  DISPOSITION OF SPECIMEN:  PATHOLOGY  COUNTS:  YES  TOURNIQUET:  * No tourniquets in log *  DICTATION: .Dragon Dictation  After informed consent was obtained the patient was brought to the operating room and placed in the supine position on the operating table.  After adequate induction of general anesthesia the patient's bilateral chest, breast, and axillary areas were prepped with ChloraPrep, allowed to dry, and draped in usual sterile manner.  An appropriate timeout was performed.  Earlier in the day the patient underwent injection of 1 mCi of technetium sulfur colloid in the subareolar position of both breasts.  Attention was first turned to the right breast.  An elliptical incision was made around the nipple and areola complex in order to minimize the excess skin.  The incision was carried through the skin and subcutaneous tissue sharply with the PlasmaBlade.  Breast hooks were used to elevate the skin flaps anteriorly towards the ceiling.  Thin skin flaps were then created by dissecting between the breast tissue and the subcutaneous fat and skin.  This dissection was carried circumferentially all the way to the chest wall.  Next the breast was removed from the pectoralis muscle with the pectoralis fascia sharply with the PlasmaBlade.  Once this  dissection was complete then the entire right breast was removed from the patient.  It was oriented with a stitch on the lateral skin and sent to pathology for further evaluation.  Hemostasis was achieved using the Bovie electrocautery.  The neoprobe was then set to technetium and was used to guide dissection into the deep right axillary space.  I was able to identify 2 hot lymph nodes.  Each of these was excised sharply with the PlasmaBlade and the surrounding small vessels and lymphatics were controlled with clips.  Ex vivo counts on these nodes were approximately 300.  No other hot or palpable nodes were identified in the right axilla.  The wound was irrigated with copious amounts of saline.  A small stab incision was made near the anterior axillary line inferior to the operative bed.  Hemostat was placed through this incision and used to bring a 19 [place] round Blake drain into the operative bed.  The drain was curled along the chest wall.  The drain was anchored to the skin with a 3-0 nylon stitch.  Next the superior and inferior skin flaps were grossly reapproximated with interrupted 3-0 Vicryl stitches.  The skin was then closed with a running 4-0 Monocryl subcuticular stitch.  Attention was then turned to the left breast.  A similar elliptical incision was made around the nipple and areola complex in order to minimize the excess skin.  The incision was carried through the skin and subcutaneous tissue sharply with the PlasmaBlade.  Breast hooks were used to elevate the skin flaps anteriorly towards the ceiling.  Thin skin flaps were then created by dissecting between the breast tissue and the subcutaneous fat and skin.  This dissection was carried circumferentially all the way to the chest wall.  Next the breast was removed from the pectoralis muscle with the pectoralis fascia sharply with the PlasmaBlade.  Once this dissection was complete then the entire left breast was removed from the patient.  It was  oriented with a stitch on the lateral skin and sent to pathology for further evaluation.  On both sides a perforating vessel medially was controlled with a figure-of-eight 3-0 Vicryl stitch.  The neoprobe was then used to direct blunt hemostat dissection into the deep left axillary space.  I was able to identify 2 hot lymph nodes.  Each of these was excised sharply with the PlasmaBlade and the surrounding small vessels and lymphatics were controlled with clips.  Ex vivo counts on these nodes ranged from [phone].  No other hot or palpable nodes were identified in the left axilla.  Hemostasis was achieved using the Bovie electrocautery.  The wound was irrigated with copious amounts of saline.  A small stab incision was made near the anterior axillary line inferior to the operative bed.  A hemostat was placed through this opening and used to bring a 19 [place] round Blake drain into the operative bed.  The drain was curled along the chest wall.  The drain was anchored to the skin with a 3-0 nylon stitch.  Next the superior and inferior skin flaps were grossly reapproximated with interrupted 3-0 Vicryl stitches.  The skin was then closed with a running 4-0 Monocryl subcuticular stitch.  Dermabond dressings and sterile drain dressings were applied.  The patient tolerated the procedure well.  At the end of the case all needle sponge and instrument counts were correct.  The patient was awakened and taken to recovery in stable condition.  The patient's skin flaps appeared healthy and viable  PLAN OF CARE: Admit for overnight observation  PATIENT DISPOSITION:  PACU - hemodynamically stable.   Delay start of Pharmacological VTE agent (>24hrs) due to surgical blood loss or risk of bleeding: no

## 2024-05-29 NOTE — Anesthesia Procedure Notes (Signed)
 Anesthesia Regional Block: Pectoralis block   Pre-Anesthetic Checklist: , timeout performed,  Correct Patient, Correct Site, Correct Laterality,  Correct Procedure, Correct Position, site marked,  Risks and benefits discussed,  Pre-op evaluation,  At surgeon's request and post-op pain management  Laterality: Left  Prep: Maximum Sterile Barrier Precautions used, chloraprep       Needles:  Injection technique: Single-shot  Needle Type: Echogenic Stimulator Needle     Needle Length: 9cm  Needle Gauge: 22     Additional Needles:   Procedures:,,,, ultrasound used (permanent image in chart),,    Narrative:  Start time: 05/29/2024 10:11 AM End time: 05/29/2024 10:13 AM Injection made incrementally with aspirations every 5 mL.  Performed by: Personally  Anesthesiologist: Vernadine Golas, MD  Additional Notes: Risks, benefits, and alternative discussed. Patient gave consent for procedure. Patient prepped and draped in sterile fashion. Sedation administered, patient remains easily responsive to voice. Relevant anatomy identified with ultrasound guidance. Local anesthetic given in 5cc increments with no signs or symptoms of intravascular injection. No pain or paraesthesias with injection. Patient monitored throughout procedure with signs of LAST or immediate complications. Tolerated well. Ultrasound image placed in chart.  Amador Junes, MD

## 2024-05-29 NOTE — Anesthesia Preprocedure Evaluation (Addendum)
 Anesthesia Evaluation  Patient identified by MRN, date of birth, ID band Patient awake    Reviewed: Allergy & Precautions, NPO status , Patient's Chart, lab work & pertinent test results  History of Anesthesia Complications Negative for: history of anesthetic complications  Airway Mallampati: II  TM Distance: >3 FB Neck ROM: Full    Dental  (+) Edentulous Upper, Missing,    Pulmonary Current Smoker   Pulmonary exam normal        Cardiovascular negative cardio ROS Normal cardiovascular exam     Neuro/Psych   Anxiety Depression Bipolar Disorder   negative neurological ROS     GI/Hepatic Neg liver ROS,GERD  Medicated and Controlled,,  Endo/Other  negative endocrine ROS    Renal/GU negative Renal ROS  negative genitourinary   Musculoskeletal  (+) Arthritis ,  Fibromyalgia -  Abdominal   Peds  Hematology negative hematology ROS (+)   Anesthesia Other Findings BILATERAL BREAST CANCERS  Reproductive/Obstetrics negative OB ROS                              Anesthesia Physical Anesthesia Plan  ASA: 2  Anesthesia Plan: General   Post-op Pain Management: Tylenol  PO (pre-op)* and Regional block*   Induction: Intravenous  PONV Risk Score and Plan: 3 and Treatment may vary due to age or medical condition, Ondansetron  and Dexamethasone   Airway Management Planned: Oral ETT  Additional Equipment: None  Intra-op Plan:   Post-operative Plan: Extubation in OR  Informed Consent: I have reviewed the patients History and Physical, chart, labs and discussed the procedure including the risks, benefits and alternatives for the proposed anesthesia with the patient or authorized representative who has indicated his/her understanding and acceptance.     Dental advisory given  Plan Discussed with: CRNA  Anesthesia Plan Comments:         Anesthesia Quick Evaluation

## 2024-05-29 NOTE — Anesthesia Procedure Notes (Signed)
 Anesthesia Regional Block: Pectoralis block   Pre-Anesthetic Checklist: , timeout performed,  Correct Patient, Correct Site, Correct Laterality,  Correct Procedure, Correct Position, site marked,  Risks and benefits discussed,  Pre-op evaluation,  At surgeon's request and post-op pain management  Laterality: Right  Prep: Maximum Sterile Barrier Precautions used, chloraprep       Needles:  Injection technique: Single-shot  Needle Type: Echogenic Stimulator Needle     Needle Length: 9cm  Needle Gauge: 22     Additional Needles:   Procedures:,,,, ultrasound used (permanent image in chart),,    Narrative:  Start time: 05/29/2024 10:08 AM End time: 05/29/2024 10:11 AM Injection made incrementally with aspirations every 5 mL.  Performed by: Personally  Anesthesiologist: Vernadine Golas, MD  Additional Notes: Risks, benefits, and alternative discussed. Patient gave consent for procedure. Patient prepped and draped in sterile fashion. Sedation administered, patient remains easily responsive to voice. Relevant anatomy identified with ultrasound guidance. Local anesthetic given in 5cc increments with no signs or symptoms of intravascular injection. No pain or paraesthesias with injection. Patient monitored throughout procedure with signs of LAST or immediate complications. Tolerated well. Ultrasound image placed in chart.  Amador Junes, MD

## 2024-05-29 NOTE — Anesthesia Postprocedure Evaluation (Signed)
 Anesthesia Post Note  Patient: [name]  Procedure(s) Performed: BILATERAL MASTECTOMIES WITH SENTINEL LYMPH NODE BIOPSIES (Bilateral: Breast)     Patient location during evaluation: PACU Anesthesia Type: General Level of consciousness: awake and alert Pain management: pain level controlled Vital Signs Assessment: post-procedure vital signs reviewed and stable Respiratory status: spontaneous breathing, nonlabored ventilation and respiratory function stable Cardiovascular status: blood pressure returned to baseline Postop Assessment: no apparent nausea or vomiting Anesthetic complications: no   No notable events documented.            Rayfield Cairo

## 2024-05-29 NOTE — Anesthesia Procedure Notes (Addendum)
 Procedure Name: Intubation Date/Time: 05/29/2024 10:46 AM  Performed by: Steffani Edman, CRNAPre-anesthesia Checklist: Patient identified, Emergency Drugs available, Suction available and Patient being monitored Patient Re-evaluated:Patient Re-evaluated prior to induction Oxygen Delivery Method: Circle System Utilized Preoxygenation: Pre-oxygenation with 100% oxygen Induction Type: IV induction Ventilation: Oral airway inserted - appropriate to patient size Laryngoscope Size: Mac and 3 Grade View: Grade I Tube type: Oral Tube size: 7.0 mm Number of attempts: 1 Airway Equipment and Method: Stylet and Oral airway Placement Confirmation: ETT inserted through vocal cords under direct vision, positive ETCO2 and breath sounds checked- equal and bilateral Secured at: 22 cm Tube secured with: Tape Dental Injury: Teeth and Oropharynx as per pre-operative assessment

## 2024-05-29 NOTE — Progress Notes (Signed)
 Pt has SCDs on and incentive spirometer at bedside. Pt demonstrated how to us  incentive spirometer properly. Bed alarm on and bed in lowest position. Pt's daughter and friend are at bedside.

## 2024-05-29 NOTE — Transfer of Care (Signed)
 Immediate Anesthesia Transfer of Care Note  Patient: [name]  Procedure(s) Performed: BILATERAL MASTECTOMIES WITH SENTINEL LYMPH NODE BIOPSIES (Bilateral: Breast)  Patient Location: PACU  Anesthesia Type:General  Level of Consciousness: awake and patient cooperative  Airway & Oxygen Therapy: Patient Spontanous Breathing and Patient connected to nasal cannula oxygen  Post-op Assessment: Report given to RN and Post -op Vital signs reviewed and stable  Post vital signs: Reviewed and stable  Last Vitals:  Vitals Value Taken Time  BP 181/86 05/29/24 1322  Temp    Pulse 90 05/29/24 1324  Resp 18 05/29/24 1324  SpO2 100 % 05/29/24 1324  Vitals shown include unfiled device data.  Last Pain:  Vitals:   05/29/24 0938  TempSrc: Oral  PainSc: 0-No pain      Patients Stated Pain Goal: 3 (05/29/24 1610)  Complications: No notable events documented.

## 2024-05-29 NOTE — H&P (Signed)
 REFERRING PHYSICIAN: Gudena, Vinay K, MD PROVIDER: Arlester Bence, MD MRN: [id number] DOB: May 30, 1946 Subjective    Chief Complaint: Breast Cancer  History of Present Illness: [name] is a 78 y.o. female who is seen today as an office consultation for evaluation of Breast Cancer  We are asked to see the patient in consultation by Dr. Lee Public to evaluate her for a new left breast cancer. The patient is a 78 year old white female who recently went for a routine screening mammogram. At that time she was found to have 4 masses in 3 locations of the left breast. The masses ranged from 4 mm to 9 mm. The 2 masses in the near aspect of the left breast for adjacent to each other. The lymph nodes in the axilla looked normal. 3 of the masses were biopsied and came back as grade 2 invasive ductal cancer that was ER and PR positive and HER2 negative with a Ki-67 of 1%. She has a history of right breast cancer treated with lumpectomy and radiation back in 2007. She does smoke about a pack of cigarettes a day.  Review of Systems: A complete review of systems was obtained from the patient. I have reviewed this information and discussed as appropriate with the patient. See HPI as well for other ROS.  ROS   Medical History: Past Medical History:  Diagnosis Date  Anxiety  Arthritis  History of cancer  Hyperlipidemia   Patient Active Problem List  Diagnosis  Dysthymic disorder  Hyperlipidemia  Malignant neoplasm of overlapping sites of left breast in female, estrogen receptor positive (CMS/HHS-HCC)   Past Surgical History:  Procedure Laterality Date  Breast Surgery Right 12/11/2006  .Carpal Tunnel Release  APPENDECTOMY    Allergies  Allergen Reactions  Penicillins Anaphylaxis, Diarrhea and Nausea And Vomiting  Doxycycline  Unknown  Sulfa (Sulfonamide Antibiotics) Other (See Comments)  Fentanyl  Other (See Comments)  Other reaction(s): Confusion   Current Outpatient Medications on File  Prior to Visit  Medication Sig Dispense Refill  hydroCHLOROthiazide (MICROZIDE) 12.5 mg capsule Take 12.5 mg by mouth once daily  mirtazapine  (REMERON ) 45 MG tablet Take 45 mg by mouth at bedtime  buPROPion  (WELLBUTRIN  XL) 300 MG XL tablet Take 300 mg by mouth every morning  cetirizine (ZYRTEC) 10 MG tablet Take 10 mg by mouth once daily  divalproex  (DEPAKOTE ) 500 MG DR tablet Take 500 mg by mouth 3 (three) times daily  ergocalciferol , vitamin D2, 1,250 mcg (50,000 unit) capsule Take 50,000 Units by mouth every 7 (seven) days  lansoprazole (PREVACID) 15 MG DR capsule Take 15 mg by mouth once daily  simvastatin  (ZOCOR ) 20 MG tablet Take 20 mg by mouth at bedtime   No current facility-administered medications on file prior to visit.   Family History  Problem Relation Age of Onset  Skin cancer Mother  Hyperlipidemia (Elevated cholesterol) Mother  Coronary Artery Disease (Blocked arteries around heart) Mother  Hyperlipidemia (Elevated cholesterol) Father  Coronary Artery Disease (Blocked arteries around heart) Father  Diabetes Father  Colon cancer Father  High blood pressure (Hypertension) Sister  Hyperlipidemia (Elevated cholesterol) Sister  Diabetes Sister  Hyperlipidemia (Elevated cholesterol) Brother  Coronary Artery Disease (Blocked arteries around heart) Brother    Social History   Tobacco Use  Smoking Status Every Day  Current packs/day: 1.00  Average packs/day: 1 pack/day for 20.0 years (20.0 ttl pk-yrs)  Types: Cigarettes  Start date: 05/13/2004  Smokeless Tobacco Not on file    Social History   Socioeconomic History  Marital status:  Unknown  Tobacco Use  Smoking status: Every Day  Current packs/day: 1.00  Average packs/day: 1 pack/day for 20.0 years (20.0 ttl pk-yrs)  Types: Cigarettes  Start date: 05/13/2004  Vaping Use  Vaping status: Never Used  Substance and Sexual Activity  Alcohol use: Not Currently   Objective:  There were no vitals filed for this  visit.  There is no height or weight on file to calculate BMI.  Physical Exam Vitals reviewed.  Constitutional:  General: She is not in acute distress. Appearance: Normal appearance.  HENT:  Head: Normocephalic and atraumatic.  Right Ear: External ear normal.  Left Ear: External ear normal.  Nose: Nose normal.  Mouth/Throat:  Mouth: Mucous membranes are moist.  Pharynx: Oropharynx is clear.  Eyes:  General: No scleral icterus. Extraocular Movements: Extraocular movements intact.  Conjunctiva/sclera: Conjunctivae normal.  Pupils: Pupils are equal, round, and reactive to light.  Cardiovascular:  Rate and Rhythm: Normal rate and regular rhythm.  Pulses: Normal pulses.  Heart sounds: Normal heart sounds.  Pulmonary:  Effort: Pulmonary effort is normal. No respiratory distress.  Breath sounds: Normal breath sounds.  Abdominal:  General: Bowel sounds are normal.  Palpations: Abdomen is soft.  Tenderness: There is no abdominal tenderness.  Musculoskeletal:  General: No swelling, tenderness or deformity. Normal range of motion.  Cervical back: Normal range of motion and neck supple.  Skin: General: Skin is warm and dry.  Coloration: Skin is not jaundiced.  Neurological:  General: No focal deficit present.  Mental Status: She is alert and oriented to person, place, and time.  Psychiatric:  Mood and Affect: Mood normal.  Behavior: Behavior normal.     Breast: There is no palpable mass in either breast. There is no palpable axillary, supraclavicular, or cervical lymphadenopathy.  Labs, Imaging and Diagnostic Testing:  Assessment and Plan:   Diagnoses and all orders for this visit:  Malignant neoplasm of overlapping sites of left breast in female, estrogen receptor positive (CMS/HHS-HCC) - [facility] Case Posting Request; Future   The patient appears to have 4 cancers in 3 spots of the left breast with clinically negative nodes and favorable markers. Given how many cancers  were found I would recommend further evaluation with MRI. If the MRI looks similar to what we have then she would be a candidate for 3 lumpectomies and sentinel node biopsy. If the MRI looks larger than she may need to consider mastectomy. We will call her with results of the MRI and then proceed accordingly. I have discussed with her in detail the risks and benefits of the operation as well as some of the technical aspects including use of radioactive seeds for localization and she understands and wishes to proceed. She will also meet with medical and radiation oncology to discuss adjuvant therapy. We will move forward with surgical scheduling once the MRI is done   After MRI she was found to have cancers on both sides and the cancer on the left looked much larger.  Because of this and with her history of previous right lumpectomy and radiation her best option is for bilateral mastectomies with bilateral sentinel node biopsies.  I have discussed with her in detail the risks and benefits of the operation as well as some of the technical aspects and she understands and wishes to proceed.  She will not be a candidate for reconstruction at the moment.

## 2024-05-29 NOTE — Progress Notes (Signed)
 Pt 95% on room air. Pt refused to put on oxygen. Nasal cannula set up for oxygen therapy if needed. Communicated to oncoming nurse, Chasidy, LPN.   16/10/96 1535  Vitals  Temp 98.4 F (36.9 C)  Temp Source Oral  BP (!) 146/88  MAP (mmHg) 104  BP Location Right Arm  BP Method Automatic  Patient Position (if appropriate) Lying  Pulse Rate 99  Pulse Rate Source Monitor  Resp 18  MEWS COLOR  MEWS Score Color Green  Oxygen Therapy  SpO2 95 %  O2 Device Room Air  MEWS Score  MEWS Temp 0  MEWS Systolic 0  MEWS Pulse 0  MEWS RR 0  MEWS LOC 0  MEWS Score 0

## 2024-05-29 NOTE — Progress Notes (Signed)
Assisted Dr. Stephannie Peters with left, right, pectoralis, ultrasound guided block. Side rails up, monitors on throughout procedure. See vital signs in flow sheet. Tolerated Procedure well.

## 2024-05-29 NOTE — Progress Notes (Signed)
 Received msg from secretary that [name] called to confirm he will pick pt up to transport home when pt is d'c. [name] is listed as a contact in pt's chart.

## 2024-05-30 ENCOUNTER — Ambulatory Visit (HOSPITAL_COMMUNITY)

## 2024-05-30 ENCOUNTER — Encounter (HOSPITAL_COMMUNITY): Payer: Self-pay | Admitting: General Surgery

## 2024-05-30 DIAGNOSIS — C50812 Malignant neoplasm of overlapping sites of left female breast: Secondary | ICD-10-CM | POA: Diagnosis not present

## 2024-05-30 LAB — BASIC METABOLIC PANEL WITH GFR
Anion gap: 8 (ref 5–15)
BUN: 6 mg/dL — ABNORMAL LOW (ref 8–23)
CO2: 28 mmol/L (ref 22–32)
Calcium: 8.6 mg/dL — ABNORMAL LOW (ref 8.9–10.3)
Chloride: 98 mmol/L (ref 98–111)
Creatinine, Ser: 0.87 mg/dL (ref 0.44–1.00)
GFR, Estimated: >60 mL/min (ref >60)
Glucose, Bld: 100 mg/dL — ABNORMAL HIGH (ref 70–99)
Potassium: 3.9 mmol/L (ref 3.5–5.1)
Sodium: 134 mmol/L — ABNORMAL LOW (ref 135–145)

## 2024-05-30 LAB — CBC
HCT: 40 % (ref 36.0–46.0)
Hemoglobin: 13.7 g/dL (ref 12.0–15.0)
MCH: 32.1 pg (ref 26.0–34.0)
MCHC: 34.3 g/dL (ref 30.0–36.0)
MCV: 93.7 fL (ref 80.0–100.0)
Platelets: 246 10*3/uL (ref 150–400)
RBC: 4.27 MIL/uL (ref 3.87–5.11)
RDW: 12.5 % (ref 11.5–15.5)
WBC: 10.5 10*3/uL (ref 4.0–10.5)
nRBC: 0 % (ref 0.0–0.2)

## 2024-05-30 MED ORDER — GABAPENTIN 100 MG PO CAPS: 100.0000 mg | ORAL_CAPSULE | Freq: Three times a day (TID) | ORAL | Status: DC | PRN

## 2024-05-30 MED ORDER — OXYCODONE HCL 5 MG PO TABS: 5.0000 mg | ORAL_TABLET | Freq: Four times a day (QID) | ORAL | 0 refills | Status: DC | PRN

## 2024-05-30 MED ORDER — METHOCARBAMOL 500 MG PO TABS: 500.0000 mg | ORAL_TABLET | Freq: Three times a day (TID) | ORAL | 2 refills | Status: DC | PRN

## 2024-05-30 MED ORDER — HYDRALAZINE HCL 20 MG/ML IJ SOLN: 5.0000 mg | Freq: Four times a day (QID) | INTRAMUSCULAR | Status: DC | PRN

## 2024-05-30 MED ORDER — GABAPENTIN 100 MG PO CAPS: 100.0000 mg | ORAL_CAPSULE | Freq: Three times a day (TID) | ORAL | 2 refills | Status: AC | PRN

## 2024-05-30 MED ORDER — METOPROLOL TARTRATE 5 MG/5ML IV SOLN: 5.0000 mg | Freq: Four times a day (QID) | INTRAVENOUS | Status: DC | PRN

## 2024-05-30 NOTE — Progress Notes (Signed)
 Binder was changed, dressing and biopatch as well. JP drains had 30ml in each bulb that was drained.

## 2024-05-30 NOTE — Progress Notes (Signed)
 Went to give 2200 meds and the Pt informed me that she had brought her daily meds from home in a bag and had taken them because she was scared we wouldn't give her the correct meds. Made pharmacy aware and MD. Educated Pt as to why this wasn't ideal and instructed her to take what we have on her MAR, informed her that we would reevaluate her med list and ensure she receives the medications that are needed during her admission. Pt expressed understanding.

## 2024-05-30 NOTE — Progress Notes (Signed)
 Upon assessment I noticed pt gown, bed linens and ABD binder were soiled with blood, checked the dressing of the JP drain and it too was saturated with blood and leaking around the incision area. Called OR for another binder, also made MD aware she stated to change the dressing, binder and ensure the drain was draining. Prior to this the drain was empted and had 50ml of serosanguinous drainage.

## 2024-05-30 NOTE — Progress Notes (Signed)
 1 Day Post-Op   Subjective/Chief Complaint: Complains of pain left triceps area. She says she fell on this recently   Objective: Vital signs in last 24 hours: Temp:  [98.1 F (36.7 C)-99.2 F (37.3 C)] 98.8 F (37.1 C) (06/07 0650) Pulse Rate:  [82-99] 87 (06/07 0650) Resp:  [13-23] 17 (06/07 0650) BP: (138-182)/(82-104) 171/99 (06/07 0650) SpO2:  [92 %-99 %] 97 % (06/07 0650) Weight:  [54 kg] 54 kg (06/06 0938) Last BM Date : 05/28/24  Intake/Output from previous day: 06/06 0701 - 06/07 0700 In: 1908 [P.O.:240; I.V.:1218; IV Piggyback:450] Out: 145 [Drains:125; Blood:20] Intake/Output this shift: No intake/output data recorded.  General appearance: alert and cooperative Resp: clear to auscultation bilaterally Chest wall: skin flaps look good Cardio: regular rate and rhythm GI: soft, non-tender; bowel sounds normal; no masses,  no organomegaly  Lab Results:  Recent Labs    05/30/24 0520  WBC 10.5  HGB 13.7  HCT 40.0  PLT 246   BMET Recent Labs    05/30/24 0520  NA 134*  K 3.9  CL 98  CO2 28  GLUCOSE 100*  BUN 6*  CREATININE 0.87  CALCIUM 8.6*   PT/INR No results for input(s): "LABPROT", "INR" in the last 72 hours. ABG No results for input(s): "PHART", "HCO3" in the last 72 hours.  Invalid input(s): "PCO2", "PO2"  Studies/Results: NM Sentinel Node Inj-No Rpt (Breast) Result Date: 05/29/2024 Lymphoseek was injected by the Nuclear Medicine Technologist for sentinel lymph node localization.    Anti-infectives: Anti-infectives (From admission, onward)    Start     Dose/Rate Route Frequency Ordered Stop   05/29/24 1000  vancomycin  (VANCOCIN ) IVPB 1000 mg/200 mL premix        1,000 mg 200 mL/hr over 60 Minutes Intravenous On call to O.R. 05/29/24 0946 05/29/24 1147   05/29/24 1000  vancomycin  (VANCOCIN ) IVPB 1000 mg/200 mL premix  Status:  Discontinued        1,000 mg 200 mL/hr over 60 Minutes Intravenous On call to O.R. 05/29/24 0946 05/29/24 0952    05/29/24 0936  vancomycin  (VANCOCIN ) 1-5 GM/200ML-% IVPB       Note to Pharmacy: Irwin Manual B: cabinet override      05/29/24 0936 05/29/24 1101       Assessment/Plan: s/p Procedure(s) with comments: BILATERAL MASTECTOMIES WITH SENTINEL LYMPH NODE BIOPSIES (Bilateral) - 150 MINUTES BILATERAL MASTECTOMIES BILATERAL SENTINEL NODE BIOPSIES GEN w/PEC BLOCK Advance diet Discharge Will get xray of left humerus Teach pt and family drain care Add neurontin   LOS: 1 day    [name] 05/30/2024

## 2024-05-30 NOTE — Progress Notes (Signed)
 Provided drain education to patient and ex husband regarding emptying and care at home. Provided supplies, and patient's ex husband demonstrated how to empty the drain. No further questions or concerns voiced by patient or ex husband with opportunity for questions or concerns provided.

## 2024-05-30 NOTE — Plan of Care (Signed)

## 2024-06-01 ENCOUNTER — Other Ambulatory Visit (HOSPITAL_COMMUNITY)

## 2024-06-01 ENCOUNTER — Encounter (HOSPITAL_COMMUNITY)

## 2024-06-04 ENCOUNTER — Inpatient Hospital Stay: Attending: Hematology and Oncology | Admitting: Dietician

## 2024-06-04 ENCOUNTER — Telehealth: Payer: Self-pay | Admitting: Dietician

## 2024-06-04 DIAGNOSIS — G8918 Other acute postprocedural pain: Secondary | ICD-10-CM | POA: Insufficient documentation

## 2024-06-04 DIAGNOSIS — Z1721 Progesterone receptor positive status: Secondary | ICD-10-CM | POA: Insufficient documentation

## 2024-06-04 DIAGNOSIS — Z170 Estrogen receptor positive status [ER+]: Secondary | ICD-10-CM | POA: Insufficient documentation

## 2024-06-04 DIAGNOSIS — C50312 Malignant neoplasm of lower-inner quadrant of left female breast: Secondary | ICD-10-CM | POA: Insufficient documentation

## 2024-06-04 DIAGNOSIS — Z1732 Human epidermal growth factor receptor 2 negative status: Secondary | ICD-10-CM | POA: Insufficient documentation

## 2024-06-04 DIAGNOSIS — I951 Orthostatic hypotension: Secondary | ICD-10-CM | POA: Insufficient documentation

## 2024-06-04 LAB — SURGICAL PATHOLOGY

## 2024-06-04 NOTE — Progress Notes (Signed)
 Nutrition Assessment  Patient returned message and had ex-husband [name] with her during call.  Reason for Assessment: MST screen for weight loss.   ASSESSMENT: Patient is  78 year old female with multicentric Left Breast LIQ & UIQ (Multiple Sites), Invasive and in situ ductal carcinoma, ER+ / PR+ / Her2-, Grade 2, history of right breast cancer s/p right breast lumpectomy performed in 2007 followed by adjuvant radiation therapy.  She underwent bilateral mastectomy last week.  She reports she's eating much better now that people are bringing her food. She has had significant weight loss prior to her surgery and worries that she has anorexia.  She does still take Remeron  but that is not effective to aid with appetite.  She will sometimes skip evening meal if she just doesn't want to eat.  She is willing to use a ONS as a meal replacement.  She has 3 different types of ONS that she started using (premier, [employer], [employer]).  She doesn't take any vitamins other than Vitamin D . She doesn't like any dairy.    Usual intake   Tomato soup with crackers,  Scrambled egg with toast (doesn't really like egg, wouldn't consider increasing to 2) Container homemade chicken noodle soup. Spaghetti with meat sauce No snacking, never been snacker  Fluids: Coke, lemonade 5 glasses, water 1 bottle, no milk, no dairy  Anthropometrics:  31 pound (20%) weight loss past 6 months  Height: 65 Weight: 119# UBW: 150# BMI: 19.81    NUTRITION DIAGNOSIS: Inadequate PO intake to meet increased nutrient needs, r/t cancer diagnosis and need to heal post surgery  MALNUTRITION DIAGNOSIS: Suspect sever malnutrition with significant weight loss (20%past 6 months), low BMI, skipped meals, and patient's report than her skin is sagging on her which would indicate loss of LBM and fat stores.   INTERVENTION:  Relayed that nutrition services are wrap around service provided at no charge and encouraged continued  communication if experiencing continued weight loss or any nutritional impact symptoms (NIS).  Educated on importance of adequate calorie and protein energy intake  to aid in healing. Encouraged MVI daily, Juven or Arginaid for 2 weeks to aid with healing.  Reviewed strategies for oral nutrition supplements (Trial Boost VHC as meal replacement at night if no appetite or wanting to skip meal)   Discussed strategies for protein pacing throughout day. (Peanut butter or humus on toast in morning, meat, fish,chicken, or beans size of deck of cards or computer mouse at lunch and dinner or use of protein drink)  Encouraged drinking orange juice with fortified with calcium and vitamin D  twice a day.  Encouraged weighing herself once a week. Talk with her PCP about an alternative appetite stimulant.  Emailed recommendations to patient and supportive ex-husband [email or web address] with contact information contact information provided   MONITORING, EVALUATION, GOAL: weight, PO intake, Nutrition Impact Symptoms, labs  Goal is weight maintenance  Next Visit: Remote 2 weeks to monitor healing   Carleen Chary, RDN, LDN Registered Dietitian, [facility] Part Time Remote (Usual office hours: Tuesday-Thursday) Cell: [phone]

## 2024-06-04 NOTE — Telephone Encounter (Signed)
 Patient screened on MST. First attempt to reach. Provided my cell# on voice mail and in text to return call for a remote nutrition consult.  Carleen Chary, RDN, LDN Registered Dietitian, [facility] Part Time Remote (Usual office hours: Tuesday-Thursday) Cell: [phone]

## 2024-06-05 ENCOUNTER — Encounter: Payer: Self-pay | Admitting: *Deleted

## 2024-06-05 ENCOUNTER — Encounter: Payer: Self-pay | Admitting: General Practice

## 2024-06-05 NOTE — Progress Notes (Signed)
 [facility] Spiritual Care Note  Reached Ms West by phone, scheduling visit in Spiritual Care office for Monday 06/08/2024 following her appointment with Dr Lee Public, per her request. She has direct Spiritual Care number in case she needs to make changes.   [address] Dorice Gardner, [place], [facility] Pager [phone] Voicemail [phone]

## 2024-06-08 ENCOUNTER — Inpatient Hospital Stay: Admitting: Hematology and Oncology

## 2024-06-08 VITALS — BP 120/90 | HR 95 | Temp 99.5°F | Resp 18 | Ht 65.0 in | Wt 121.7 lb

## 2024-06-08 DIAGNOSIS — Z170 Estrogen receptor positive status [ER+]: Secondary | ICD-10-CM | POA: Diagnosis not present

## 2024-06-08 DIAGNOSIS — C50911 Malignant neoplasm of unspecified site of right female breast: Secondary | ICD-10-CM

## 2024-06-08 DIAGNOSIS — Z1732 Human epidermal growth factor receptor 2 negative status: Secondary | ICD-10-CM | POA: Diagnosis not present

## 2024-06-08 DIAGNOSIS — C50312 Malignant neoplasm of lower-inner quadrant of left female breast: Secondary | ICD-10-CM | POA: Diagnosis present

## 2024-06-08 DIAGNOSIS — C50912 Malignant neoplasm of unspecified site of left female breast: Secondary | ICD-10-CM

## 2024-06-08 DIAGNOSIS — I951 Orthostatic hypotension: Secondary | ICD-10-CM | POA: Diagnosis not present

## 2024-06-08 DIAGNOSIS — G8918 Other acute postprocedural pain: Secondary | ICD-10-CM | POA: Diagnosis not present

## 2024-06-08 DIAGNOSIS — Z1721 Progesterone receptor positive status: Secondary | ICD-10-CM | POA: Diagnosis not present

## 2024-06-08 MED ORDER — ANASTROZOLE 1 MG PO TABS: 1.0000 mg | ORAL_TABLET | Freq: Every day | ORAL | 3 refills | Status: AC

## 2024-06-08 NOTE — Progress Notes (Signed)
 Patient Care Team: Janese Medicine., MD as PCP - General (Family Medicine) Cameron Cea, MD as Consulting Physician (Hematology and Oncology) Auther Bo, RN as Oncology Nurse Navigator Alane Hsu, RN as Oncology Nurse Navigator Caralyn Chandler, MD as Consulting Physician (General Surgery) Colie Dawes, MD as Attending Physician (Radiation Oncology)  DIAGNOSIS:  Encounter Diagnosis  Name Primary?   Bilateral malignant neoplasm of breast in female, estrogen receptor positive, unspecified site of breast (HCC) Yes    SUMMARY OF ONCOLOGIC HISTORY: 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: Lumpectomy radiation declined tamoxifen Screening mammogram detected left breast mass 6:00: 0.9 cm, additional mass 0.6 cm at 10:00, 3 additional masses 0.6 cm 0.4 cm and 0.7 cm (benign concordant), biopsy revealed grade 2 IDC ER 100% PR 95% Ki67 1%, HER2 negative by [facility]   05/13/2024 Cancer Staging   Staging form: Breast, AJCC 8th Edition - Clinical stage from 05/13/2024: Stage IA (cT1b, cN0, cM0, G2, ER+, PR+, HER2-) - Signed by Cameron Cea, MD on 05/13/2024 Stage prefix: Initial diagnosis Histologic grading system: 3 grade system Laterality: Left Staged by: Pathologist and managing physician Stage used in treatment planning: Yes National guidelines used in treatment planning: Yes Type of national guideline used in treatment planning: NCCN   05/24/2024 Genetic Testing   Negative [facility] CancerNext-Expanded +RNAinsight Panel.  Report date is 05/24/2024.   The CancerNext-Expanded gene panel offered by [facility] and includes sequencing, rearrangement, and RNA analysis for the following 77 genes: AIP, ALK, APC, ATM, AXIN2, BAP1, BARD1, BMPR1A, BRCA1, BRCA2, BRIP1, CDC73, CDH1, CDK4, CDKN1B, CDKN2A, CEBPA, CHEK2, CTNNA1, DDX41, DICER1, ETV6, FH, FLCN,  GATA2, LZTR1, MAX, MBD4, MEN1, MET, MLH1, MSH2, MSH3, MSH6, MUTYH, NF1, NF2, NTHL1, PALB2, PHOX2B, PMS2, POT1, PRKAR1A, PTCH1, PTEN, RAD51C, RAD51D, RB1, RET, RUNX1, RSP20, SDHA, SDHAF2, SDHB, SDHC, SDHD, SMAD4, SMARCA4, SMARCB1, SMARCE1, STK11, SUFU, TMEM127, TP53, TSC1, TSC2, VHL, and WT1 (sequencing and deletion/duplication); EGFR, HOXB13, KIT, MITF, PDGFRA, POLD1, and POLE (sequencing only); EPCAM and GREM1 (deletion/duplication only).    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  She experiences significant postoperative pain, particularly on the left side, described as discomfort. She is taking oxycodone , initially four times a day, reduced to two times a day due to low supply, and plans to adjust to three times a day with a recent refill of 15 pills.  She had a recent syncope episode when standing from a sofa, with brief unconsciousness, attributed to low blood pressure.  Her pathology report shows stage one breast cancer bilaterally, with the right side estrogen receptor negative and the left side estrogen receptor positive.  She is concerned about surgical scars and discomfort from drains, with fluid output around 16-17 cc. Bruising is more pronounced on the left side.  She has a family history of breast cancer, with her daughter also affected.  She feels sick and  experiences hot flashes, with no fever or infection symptoms.     ALLERGIES:  is allergic to  augmentin [amoxicillin-pot clavulanate], doxycycline , sulfa drugs cross reactors, and fentanyl  and related.  MEDICATIONS:  Current Outpatient Medications  Medication Sig Dispense Refill   [START ON 06/23/2024] anastrozole (ARIMIDEX) 1 MG tablet Take 1 tablet (1 mg total) by mouth daily. 90 tablet 3   buPROPion  HCl (WELLBUTRIN  XL PO) Take 1 tablet by mouth daily.     cetirizine (ZYRTEC) 10 MG tablet Take 10 mg by mouth every evening.     escitalopram  (LEXAPRO ) 20 MG tablet Take 40 mg by mouth daily.     gabapentin  (NEURONTIN ) 100 MG capsule Take 1 capsule (100 mg total) by mouth 3 (three) times daily as needed. 90 capsule 2   hydrochlorothiazide (MICROZIDE) 12.5 MG capsule Take 12.5 mg by mouth daily.     lansoprazole (PREVACID) 15 MG capsule Take 15 mg by mouth daily.      LORazepam  (ATIVAN ) 0.5 MG tablet Take 0.5 mg by mouth daily as needed for anxiety.     methocarbamol  (ROBAXIN ) 500 MG tablet Take 1 tablet (500 mg total) by mouth every 8 (eight) hours as needed (use for muscle cramps/pain). 30 tablet 2   mirtazapine  (REMERON ) 45 MG tablet Take 45 mg by mouth at bedtime.     ondansetron  (ZOFRAN ) 8 MG tablet Take 8 mg by mouth every 8 (eight) hours as needed for nausea or vomiting.     oxyCODONE  (ROXICODONE ) 5 MG immediate release tablet Take 1 tablet (5 mg total) by mouth every 6 (six) hours as needed for severe pain (pain score 7-10). 15 tablet 0   simethicone  (GAS-X) 80 MG chewable tablet Chew 1 tablet (80 mg total) by mouth 4 (four) times daily as needed for flatulence.     simvastatin  (ZOCOR ) 20 MG tablet Take 20 mg by mouth at bedtime.       tiZANidine (ZANAFLEX) 2 MG tablet Take 2 mg by mouth every 6 (six) hours as needed for muscle spasms.     valbenazine (INGREZZA) 40 MG capsule Take 40 mg by mouth daily.     Vitamin D , Ergocalciferol , (DRISDOL ) 50000 units CAPS capsule Take 50,000 Units by mouth every 7 (seven) days. wednesday     QUEtiapine  (SEROQUEL ) 100 MG tablet Take 150 mg by  mouth at bedtime.      No current facility-administered medications for this visit.    PHYSICAL EXAMINATION: ECOG PERFORMANCE STATUS: 1 - Symptomatic but completely ambulatory  Vitals:   06/08/24 1352  BP: (!) 120/90  Pulse: 95  Resp: 18  Temp: 99.5 F (37.5 C)  SpO2: 99%   Filed Weights   06/08/24 1352  Weight: 121 lb 11.2 oz (55.2 kg)    Physical Exam BREAST: Surgical scar on breast appears excellent. One breast exhibits more bruising. Overall, breasts appear perfect with no concerns. No signs of infection, healing is progressing well.  (exam performed in the presence of a chaperone)  LABORATORY DATA:  I have reviewed the data as listed    Latest Ref Rng & Units 05/30/2024    5:20 AM 05/13/2024   12:26 PM 05/26/2020    4:05 AM  CMP  Glucose 70 - 99 mg/dL 784  696  295   BUN 8 - 23 mg/dL 6  12  22    Creatinine 0.44 - 1.00 mg/dL 2.84  1.32  4.40   Sodium 135 - 145 mmol/L 134  139  138   Potassium 3.5 -  5.1 mmol/L 3.9  4.1  4.1   Chloride 98 - 111 mmol/L 98  104  100   CO2 22 - 32 mmol/L 28  30  25    Calcium 8.9 - 10.3 mg/dL 8.6  9.3  8.6   Total Protein 6.5 - 8.1 g/dL  6.7    Total Bilirubin 0.0 - 1.2 mg/dL  0.3    Alkaline Phos 38 - 126 U/L  51    AST 15 - 41 U/L  11    ALT 0 - 44 U/L  6      Lab Results  Component Value Date   WBC 10.5 05/30/2024   HGB 13.7 05/30/2024   HCT 40.0 05/30/2024   MCV 93.7 05/30/2024   PLT 246 05/30/2024   NEUTROABS 3.7 05/13/2024    ASSESSMENT & PLAN:  Bilateral breast cancer (HCC) 05/29/2024: Bilateral mastectomies Right mastectomy: 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 These include but not limited to insomnia, hot flashes, mood changes, vaginal dryness, bone density loss, and weight gain. We strongly believe that the benefits far outweigh the risks. Patient understands these risks and consented to starting treatment. Planned treatment duration is 7 years.  Return to clinic 3 months for survivorship care plan visit  ------------------------------------- Assessment and Plan Assessment & Plan Malignant neoplasm of breast, stage 1 Stage 1 post-bilateral mastectomy. Left breast cancer ER positive, right breast ER negative. No lymph node involvement. Anastrozole to reduce estrogen and prevent recurrence. Discussed potential side effects. - Prescribe anastrozole 1 mg daily starting July 1st for five years. - Schedule follow-up in three months with nurse practitioner to discuss survivorship, including activity and diet.  Postoperative pain Persistent postoperative pain on the left side, consistent with normal surgical pain. No infection or complications. Advised alternating oxycodone  with acetaminophen . - Advise alternating oxycodone  with acetaminophen  for pain management. - Avoid NSAIDs like ibuprofen due to anticoagulant properties.  Orthostatic hypotension Recent episode with transient loss of consciousness. Advised increased water intake. - Increase water intake to manage orthostatic hypotension.      No orders of the defined types were placed in this encounter.  The patient has a good understanding of the overall plan. she agrees with it. she will call with any problems that may develop before the next visit here. Total time spent: 30 mins including face to face time and time spent for planning, charting and co-ordination of care   Margert Sheerer, MD 06/08/24

## 2024-06-08 NOTE — Assessment & Plan Note (Signed)
 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 These include but not limited to insomnia, hot flashes, mood changes, vaginal dryness, bone density loss, and weight gain. We strongly believe that the benefits far outweigh the risks. Patient understands these risks and consented to starting treatment. Planned treatment duration is 7 years.  Return to clinic 3 months for survivorship care plan visit

## 2024-06-09 ENCOUNTER — Encounter: Payer: Self-pay | Admitting: General Practice

## 2024-06-09 NOTE — Progress Notes (Signed)
 [facility] Spiritual Care Note  Ms Haberl wasn't able to stop by as planned after her appointment with Dr Lee Public yesterday, so left voicemail to follow up, including direct number and encouragement to return call when helpful to connect.   [address] Dorice Gardner, [place], [facility] Pager [phone] Voicemail [phone]

## 2024-06-18 ENCOUNTER — Telehealth: Payer: Self-pay | Admitting: Dietician

## 2024-06-18 ENCOUNTER — Inpatient Hospital Stay: Admitting: Dietician

## 2024-06-18 ENCOUNTER — Encounter: Payer: Self-pay | Admitting: *Deleted

## 2024-06-18 DIAGNOSIS — C50911 Malignant neoplasm of unspecified site of right female breast: Secondary | ICD-10-CM

## 2024-06-18 NOTE — Telephone Encounter (Signed)
 Attempted to reach patient for a scheduled remote nutrition consult. Provided my cell# on voice mail and in text to return call for her follow up nutrition consult.  Gennaro Africa, RDN, LDN Registered Dietitian, [facility] Part Time Remote (Usual office hours: Tuesday-Thursday) Cell: [phone]

## 2024-06-18 NOTE — Progress Notes (Signed)
 Nutrition Follow Up:  Patient returned message.  Patient called to say she thinks her eating concerns are more related to her depression and anxiety.  She reports she was eating really well after her surgery because people were cooking for her and dropping of foods.  She states she doesn't expect that to continue.  She inquired about help with her depression and emotional health.  She states she knows how to eat and about nutrition but can't force it.   She fears she will return to skipping meals. She is still using ONS most days only once a day.  Fluids: Coke, lemonade 5 glasses, water 1 bottle, no milk, no dairy  Anthropometrics:  Weight increased since last consult and prior 31 pound (20%) weight loss past 6 months  Height: 65 Weight:  06/13/24  121# 05/29/24  119# UBW: 150# BMI: 20.25    NUTRITION DIAGNOSIS: Inadequate PO intake to meet increased nutrient needs, r/t cancer diagnosis and need to heal post surgery. Ongoing  MALNUTRITION DIAGNOSIS: Suspect sever malnutrition with significant weight loss (20%past 6 months), low BMI, skipped meals, and patient's report than her skin is sagging on her which would indicate loss of LBM and fat stores.   INTERVENTION:   Will reach out to LCSW to contact patient to help address emotional concerns.  Reviewed strategies for oral nutrition supplements (Trial Boost [employer]HC as meal replacement at night if no appetite or wanting to skip meal)  Encouraged increasing frequency up to 4 times a day if easier than meals.  Encouraged weighing herself once a week. Talk with her PCP about an alternative anti depressant/appetite stimulant.  Encouraged patient to make appointment with lymphedema specialist to aid with reducing her pain.  Provided telephone # to make appointment.   MONITORING, EVALUATION, GOAL: weight, PO intake, Nutrition Impact Symptoms, labs  Goal is weight maintenance  Next Visit: PRN at patient or provider request, may benefit from a  referral to one of our RDs that specialize in eating disorders.   Micheline Craven, RDN, LDN Registered Dietitian, [facility] Part Time Remote (Usual office hours: Tuesday-Thursday) Cell: [phone]

## 2024-06-19 ENCOUNTER — Inpatient Hospital Stay: Admitting: Licensed Clinical Social Worker

## 2024-06-19 NOTE — Progress Notes (Signed)
 Pt called back . She reached out to [facility]. They are requesting a referral from our office. Referral sent today 06/19/24 to [phone].   Lorynn Moeser E Marshawn Normoyle, LCSW

## 2024-06-19 NOTE — Progress Notes (Signed)
 [facility] Clinical Social Work  Clinical Social Work was referred by dietitian for assessment of mental health needs.  Clinical Social Worker contacted patient by phone to offer support and assess for needs.    Pt with increased depression, pain, and decreased eating. Worse since surgery, but pre-existing to cancer diagnosis.  Pt is connected with [facility] in [place] for mental health diagnosis and has called them to determine if medications can be adjusted. Pt is interested in connecting with ongoing counseling.   Interventions: Patient interviewed and appropriate assessments performed: PHQ 9  Referred patient to community resources: counseling agencies   - [facility]  - [facility]  - Sport and exercise psychologist for [employer] Crisis resources provided   [employer] Clinical Support from 06/19/2024 in [facility] - A Dept Of [place]. [facility]  PHQ-9 Total Score 22      Follow Up Plan:  Patient will contact counseling agencies to set up an appointment.     Maicey Barrientez E Linzie Boursiquot, LCSW Clinical Social Worker [facility]

## 2024-06-22 ENCOUNTER — Telehealth: Payer: Self-pay | Admitting: Licensed Clinical Social Worker

## 2024-06-22 NOTE — Telephone Encounter (Signed)
 [facility] Clinical Social Work  TC to [facility] to follow-up on referral. Spoke with [name]. Referral was received and they will be reaching out to the pt today.   Neelam Tiggs E Marleah Beever, LCSW

## 2024-07-01 ENCOUNTER — Encounter: Payer: Self-pay | Admitting: Licensed Clinical Social Worker

## 2024-07-01 NOTE — Progress Notes (Signed)
 [facility] Clinical Social Work  Social research officer, government update received from [employer]. Pt is scheduled for intake for counseling on 07/22/2024 at 1:00pm with Rollene Brooklyn.   Reshawn Ostlund E Terrian Ridlon, LCSW

## 2024-07-13 ENCOUNTER — Other Ambulatory Visit: Payer: Self-pay

## 2024-07-13 ENCOUNTER — Emergency Department (HOSPITAL_COMMUNITY): Admission: EM | Admit: 2024-07-13 | Discharge: 2024-07-13 | Disposition: A | Discharge status: Home / Self Care

## 2024-07-13 ENCOUNTER — Encounter (HOSPITAL_COMMUNITY): Payer: Self-pay | Admitting: Emergency Medicine

## 2024-07-13 ENCOUNTER — Emergency Department (HOSPITAL_COMMUNITY)

## 2024-07-13 DIAGNOSIS — Z853 Personal history of malignant neoplasm of breast: Secondary | ICD-10-CM | POA: Diagnosis not present

## 2024-07-13 DIAGNOSIS — R0602 Shortness of breath: Secondary | ICD-10-CM | POA: Diagnosis not present

## 2024-07-13 DIAGNOSIS — R0789 Other chest pain: Secondary | ICD-10-CM | POA: Insufficient documentation

## 2024-07-13 DIAGNOSIS — R002 Palpitations: Secondary | ICD-10-CM | POA: Diagnosis not present

## 2024-07-13 DIAGNOSIS — R251 Tremor, unspecified: Secondary | ICD-10-CM | POA: Insufficient documentation

## 2024-07-13 DIAGNOSIS — F419 Anxiety disorder, unspecified: Secondary | ICD-10-CM | POA: Insufficient documentation

## 2024-07-13 LAB — CBC
HCT: 44.7 % (ref 36.0–46.0)
Hemoglobin: 15.5 g/dL — ABNORMAL HIGH (ref 12.0–15.0)
MCH: 30.9 pg (ref 26.0–34.0)
MCHC: 34.7 g/dL (ref 30.0–36.0)
MCV: 89 fL (ref 80.0–100.0)
Platelets: 287 K/uL (ref 150–400)
RBC: 5.02 MIL/uL (ref 3.87–5.11)
RDW: 12.3 % (ref 11.5–15.5)
WBC: 12.7 K/uL — ABNORMAL HIGH (ref 4.0–10.5)
nRBC: 0 % (ref 0.0–0.2)

## 2024-07-13 LAB — TROPONIN I (HIGH SENSITIVITY)
Troponin I (High Sensitivity): 7 ng/L (ref <18)
Troponin I (High Sensitivity): 8 ng/L (ref <18)

## 2024-07-13 LAB — D-DIMER, QUANTITATIVE: D-Dimer, Quant: 0.32 ug{FEU}/mL (ref 0.00–0.50)

## 2024-07-13 LAB — BASIC METABOLIC PANEL WITH GFR
Anion gap: 13 (ref 5–15)
BUN: 23 mg/dL (ref 8–23)
CO2: 21 mmol/L — ABNORMAL LOW (ref 22–32)
Calcium: 9.8 mg/dL (ref 8.9–10.3)
Chloride: 105 mmol/L (ref 98–111)
Creatinine, Ser: 0.86 mg/dL (ref 0.44–1.00)
GFR, Estimated: >60 mL/min (ref >60)
Glucose, Bld: 97 mg/dL (ref 70–99)
Potassium: 3.1 mmol/L — ABNORMAL LOW (ref 3.5–5.1)
Sodium: 139 mmol/L (ref 135–145)

## 2024-07-13 LAB — TSH: TSH: 2.908 u[IU]/mL (ref 0.350–4.500)

## 2024-07-13 MED ORDER — LACTATED RINGERS IV BOLUS
1000.0000 mL | Freq: Once | INTRAVENOUS | Status: AC
  Administered 2024-07-13: 1000 mL via INTRAVENOUS

## 2024-07-13 MED ORDER — LORAZEPAM 1 MG PO TABS: 1.0000 mg | ORAL_TABLET | Freq: Three times a day (TID) | ORAL | 0 refills | Status: DC | PRN

## 2024-07-13 MED ORDER — LORAZEPAM 1 MG PO TABS
1.0000 mg | ORAL_TABLET | Freq: Once | ORAL | Status: AC
  Administered 2024-07-13: 1 mg via ORAL
  Filled 2024-07-13: qty 1

## 2024-07-13 MED ORDER — POTASSIUM CHLORIDE CRYS ER 20 MEQ PO TBCR
40.0000 meq | EXTENDED_RELEASE_TABLET | Freq: Once | ORAL | Status: AC
  Administered 2024-07-13: 40 meq via ORAL
  Filled 2024-07-13: qty 2

## 2024-07-13 MED ORDER — DIPHENHYDRAMINE HCL 25 MG PO TABS: 25.0000 mg | ORAL_TABLET | Freq: Four times a day (QID) | ORAL | 0 refills | Status: AC | PRN

## 2024-07-13 NOTE — Discharge Instructions (Signed)
 Your workup today was reassuring.  Try taking the higher dose of Ativan  as needed for tremor and the mass to try the Benadryl  every 6 hours.  I have placed a referral to our neurology team.  Please follow-up as directed.  Return to the emergency department for worsening symptoms.

## 2024-07-13 NOTE — ED Provider Notes (Signed)
 [place] EMERGENCY DEPARTMENT AT [facility] Provider Note   CSN: [id number] Arrival date & time: 07/13/24  1517     Patient presents with: Tremors and Anxiety   [name] is a 78 y.o. female.   78 year old female with breast cancer status post recent mastectomy who is now cancer free as well as history of fibromyalgia and tardive dyskinesia presenting to the emergency department today with tremors.  The patient went to follow-up with her primary care provider regarding this today.  She states that she did develop some tightness in her chest as well as some palpitations and shortness of breath.  She was sent to the ER at that time for further evaluation.  The patient states that the tremors were worse and at the time when she was at her doctor's office she was hyperventilating.  She states that her respiratory rate is come down and she is feeling a little better.  She states he still having some shortness of breath with this.  She reports that the tremors have been going on now for the past month or 2.  Reports that this is different than what she has experienced in the past with her tardive dyskinesia.   Anxiety Associated symptoms include shortness of breath.       Prior to Admission medications   Medication Sig Start Date End Date Taking? Authorizing Provider  diphenhydrAMINE  (BENADRYL ) 25 MG tablet Take 1 tablet (25 mg total) by mouth every 6 (six) hours as needed. 07/13/24  Yes Ula Prentice SAUNDERS, MD  LORazepam  (ATIVAN ) 1 MG tablet Take 1 tablet (1 mg total) by mouth 3 (three) times daily as needed for anxiety (Tremor). 07/13/24  Yes Ula Prentice SAUNDERS, MD  anastrozole  (ARIMIDEX ) 1 MG tablet Take 1 tablet (1 mg total) by mouth daily. 06/23/24   Gudena, Vinay, MD  buPROPion  HCl (WELLBUTRIN  XL PO) Take 1 tablet by mouth daily.    [provider]  cetirizine (ZYRTEC) 10 MG tablet Take 10 mg by mouth every evening.    [provider]  escitalopram  (LEXAPRO ) 20 MG  tablet Take 40 mg by mouth daily.    [provider]  gabapentin  (NEURONTIN ) 100 MG capsule Take 1 capsule (100 mg total) by mouth 3 (three) times daily as needed. 05/30/24 05/30/25  Curvin Mt III, MD  hydrochlorothiazide (MICROZIDE) 12.5 MG capsule Take 12.5 mg by mouth daily. 04/27/24   [provider]  lansoprazole (PREVACID) 15 MG capsule Take 15 mg by mouth daily.     [provider]  LORazepam  (ATIVAN ) 0.5 MG tablet Take 0.5 mg by mouth daily as needed for anxiety.    [provider]  methocarbamol  (ROBAXIN ) 500 MG tablet Take 1 tablet (500 mg total) by mouth every 8 (eight) hours as needed (use for muscle cramps/pain). 05/30/24   Curvin Mt MOULD, MD  mirtazapine  (REMERON ) 45 MG tablet Take 45 mg by mouth at bedtime. 05/09/20   [provider]  ondansetron  (ZOFRAN ) 8 MG tablet Take 8 mg by mouth every 8 (eight) hours as needed for nausea or vomiting. 09/04/13   [provider]  oxyCODONE  (ROXICODONE ) 5 MG immediate release tablet Take 1 tablet (5 mg total) by mouth every 6 (six) hours as needed for severe pain (pain score 7-10). 05/30/24   Curvin Mt III, MD  QUEtiapine  (SEROQUEL ) 100 MG tablet Take 150 mg by mouth at bedtime.  05/04/20   [provider]  simethicone  (GAS-X) 80 MG chewable tablet Chew 1 tablet (  80 mg total) by mouth 4 (four) times daily as needed for flatulence. 05/27/20   Pokhrel, Laxman, MD  simvastatin  (ZOCOR ) 20 MG tablet Take 20 mg by mouth at bedtime.      [provider]  tiZANidine (ZANAFLEX) 2 MG tablet Take 2 mg by mouth every 6 (six) hours as needed for muscle spasms. 05/06/24   [provider]  valbenazine (INGREZZA) 40 MG capsule Take 40 mg by mouth daily.    [provider]  Vitamin D , Ergocalciferol , (DRISDOL ) 50000 units CAPS capsule Take 50,000 Units by mouth every 7 (seven) days. wednesday    [provider]    Allergies: Augmentin [amoxicillin-pot clavulanate], Doxycycline ,  Sulfa drugs cross reactors, and Fentanyl  and related    Review of Systems  Respiratory:  Positive for shortness of breath.   Neurological:  Positive for tremors.  All other systems reviewed and are negative.   Updated Vital Signs BP (!) 148/77   Pulse 78   Temp 98 F (36.7 C) (Oral)   Resp 19   Ht 5' 5 (1.651 m)   Wt 55.2 kg   SpO2 98%   BMI 20.25 kg/m   Physical Exam Vitals and nursing note reviewed.   Gen: NAD Eyes: PERRL, EOMI HEENT: no oropharyngeal swelling Neck: trachea midline Resp: clear to auscultation bilaterally Card: RRR, no murmurs, rubs, or gallops Abd: nontender, nondistended Extremities: no calf tenderness, no edema Vascular: 2+ radial pulses bilaterally, 2+ DP pulses bilaterally Neuro: Cranial nerves intact, equal strength and sensation throughout bilateral upper and lower extremities, the patient does have some mild involuntary rhythmic motion of her mouth but does not appear to have any obvious tremor on my exam Skin: no rashes Psyc: acting appropriately   (all labs ordered are listed, but only abnormal results are displayed) Labs Reviewed  BASIC METABOLIC PANEL WITH GFR - Abnormal; Notable for the following components:      Result Value   Potassium 3.1 (*)    CO2 21 (*)    All other components within normal limits  CBC - Abnormal; Notable for the following components:   WBC 12.7 (*)    Hemoglobin 15.5 (*)    All other components within normal limits  TSH  D-DIMER, QUANTITATIVE  TROPONIN I (HIGH SENSITIVITY)  TROPONIN I (HIGH SENSITIVITY)    EKG: EKG Interpretation Date/Time:  Monday July 13 2024 17:24:45 EDT Ventricular Rate:  84 PR Interval:  178 QRS Duration:  83 QT Interval:  375 QTC Calculation: 444 R Axis:   -4  Text Interpretation: Sinus rhythm Consider left atrial enlargement Anterior infarct, old Borderline repolarization abnormality Confirmed by Ula Barter [id number]) on 07/13/2024 5:42:57 PM  Radiology: CT Head Wo  Contrast Result Date: 07/13/2024 CLINICAL DATA:  Anxiety and tremors. EXAM: CT HEAD WITHOUT CONTRAST TECHNIQUE: Contiguous axial images were obtained from the base of the skull through the vertex without intravenous contrast. RADIATION DOSE REDUCTION: This exam was performed according to the departmental dose-optimization program which includes automated exposure control, adjustment of the mA and/or kV according to patient size and/or use of iterative reconstruction technique. COMPARISON:  December 27, 2017 FINDINGS: Brain: There is generalized cerebral atrophy with widening of the extra-axial spaces and ventricular dilatation. There are areas of decreased attenuation within the white matter tracts of the supratentorial brain, consistent with microvascular disease changes. Vascular: No hyperdense vessel or unexpected calcification. Skull: Normal. Negative for fracture or focal lesion. Sinuses/Orbits: No acute finding. Other: None. IMPRESSION: 1. Generalized cerebral atrophy and microvascular  disease changes of the supratentorial brain. 2. No acute intracranial abnormality. Electronically Signed   By: Suzen Dials M.D.   On: 07/13/2024 19:08   DG Chest Port 1 View Result Date: 07/13/2024 CLINICAL DATA:  Shortness of breath. EXAM: PORTABLE CHEST 1 VIEW COMPARISON:  Chest radiograph dated 05/18/2020. FINDINGS: Background of emphysema. No focal consolidation, pleural effusion, or pneumothorax. The cardiac silhouette is within normal limits. Atherosclerotic calcification of the aorta. No acute osseous pathology. Surgical clips over the left chest wall. IMPRESSION: 1. No active disease. 2. Emphysema. Electronically Signed   By: Vanetta Chou M.D.   On: 07/13/2024 17:10     Procedures   Medications Ordered in the ED  lactated ringers  bolus 1,000 mL (0 mLs Intravenous Stopped 07/13/24 1844)  LORazepam  (ATIVAN ) tablet 1 mg (1 mg Oral Given 07/13/24 1700)  potassium chloride  SA (KLOR-CON  M) CR tablet 40 mEq  (40 mEq Oral Given 07/13/24 1835)                                    Medical Decision Making 78 year old female with past medical history breast cancer in remission with recent mastectomy presenting to the emergency department today with tremors.  The symptoms been going now for the past few months.  Will obtain a head CT to evaluate for central lesion as well as basic labs to evaluate for electrolyte abnormalities.  In regards to her shortness of breath we will further evaluate her here with basic labs Wels and EKG, chest x-ray, and troponin further evaluation for ACS, pulmonary edema, pulmonary infiltrates, or pneumothorax.  Will also obtain a D-dimer given her recent surgery to evaluate for pulmonary embolism.  Will give the patient Ativan  here in the event this is due to anxiety.  I will reevaluate for ultimate disposition.  The patient's work appears reassuring.  She had 2 troponins that were negative.  CT scan is unremarkable.  The patient remained stable here.  Unclear if this is due to medication reaction but patient does not have findings consistent with NMS or serotonin syndrome at this time.  Will give her higher dose Ativan  for the next few days and she is also given Benadryl  to take as needed.  She was referred to outpatient neurology and is encouraged to return for worsening symptoms.  Amount and/or Complexity of Data Reviewed Labs: ordered. Radiology: ordered.  Risk OTC drugs. Prescription drug management.        Final diagnoses:  Tremor    ED Discharge Orders          Ordered    LORazepam  (ATIVAN ) 1 MG tablet  3 times daily PRN        07/13/24 2012    diphenhydrAMINE  (BENADRYL ) 25 MG tablet  Every 6 hours PRN        07/13/24 2012    Ambulatory referral to Neurology       Comments: An appointment is requested in approximately: 1 week   07/13/24 2012               Ula Prentice SAUNDERS, MD 07/13/24 2014

## 2024-07-13 NOTE — ED Triage Notes (Addendum)
 Patient BIB EMS c/o anxiety and  tremors. Per repot patient was at her primary care for check up when she got very anxious and worsening tremors. Primary recommended further work up.  Patient denies Fall at home. Patient denies N/V.

## 2024-07-21 NOTE — Therapy (Deleted)
 OUTPATIENT PHYSICAL THERAPY BREAST CANCER POST OP FOLLOW UP   Patient Name: [name] MRN: [id number] DOB:Jan 14, 1946, 78 y.o., female Today's Date: 07/21/2024  END OF SESSION:   Past Medical History:  Diagnosis Date   Allergy    Anemia    Anxiety    Arthritis    Astigmatism    Bursitis    Bursitis of hip    Cancer (HCC) 10/2006   Invasive ductal carcinoma of the right breast    Carpal tunnel syndrome    Cataracts, bilateral    Closed fracture of multiple pubic rami, left, initial encounter (HCC)    Colitis    Depression    Drusen of both optic discs    Endometriosis    Fibromyalgia    Gall stones    GERD (gastroesophageal reflux disease)    Hyperlipidemia    Pneumonia    Tendon tear    Past Surgical History:  Procedure Laterality Date   APPENDECTOMY     BREAST SURGERY Right 12/11/2006   Lumpectomy   CARPAL TUNNEL RELEASE     CYST EXCISION     ELBOW SURGERY     LUMBAR FUSION     MASTECTOMY W/ SENTINEL NODE BIOPSY Bilateral 05/29/2024   Procedure: BILATERAL MASTECTOMIES WITH SENTINEL LYMPH NODE BIOPSIES;  Surgeon: Curvin Deward MOULD, MD;  Location: [facility] OR;  Service: General;  Laterality: Bilateral;  150 MINUTES BILATERAL MASTECTOMIES BILATERAL SENTINEL NODE BIOPSIES GEN w/PEC BLOCK   Patient Active Problem List   Diagnosis Date Noted   Bilateral breast cancer (HCC) 05/29/2024   Genetic testing 05/26/2024   Malignant neoplasm of lower-inner quadrant of left breast in female, estrogen receptor positive (HCC) 05/11/2024   Acute stress reaction 05/22/2020   Closed fracture of multiple pubic rami, left, initial encounter [facility])    Fall 05/18/2020   Bipolar affective disorder, manic (HCC) 12/28/2017   Breast cancer, right breast (HCC) 02/04/2014   ANEMIA 08/22/2010   DEPRESSION 08/22/2010   HIATAL HERNIA 08/22/2010   DERMATITIS 08/22/2010   CHEST PAIN 08/22/2010   HYPERGLYCEMIA 08/22/2010   HYPERLIPIDEMIA 09/16/2008   ANXIETY DEPRESSION 09/16/2008   HX OF  GALLSTONE 09/16/2008    PCP:   REFERRING PROVIDER:  Dr. Deward Curvin   REFERRING DIAG: Left breast cancer   THERAPY DIAG:  No diagnosis found.  Rationale for Evaluation and Treatment: Rehabilitation  ONSET DATE: 04/06/2024   SUBJECTIVE:                                                                                                                                                                                           SUBJECTIVE STATEMENT: ***  PERTINENT HISTORY:  Patient was diagnosed on 04/06/2024 with left grade 2 invasive ductal carcinoma breast cancer. There are several areas located in the lower inner quadrant measuring 6 mm, 6 mm, 4 mm, and 9 mm. It is ER/PR positive and HER2 negative with a Ki67 of 1%. She has a history of right breast cancer in 2007. She had a right lumpectomy and sentinel node biopsy with 4 nodes removed followed by radiation .She is now s/p Bilateral Mastectomies with Bilateral deep Axillary SLNB on 05/29/2024. She has had some tardive dyskinesia before and after surgery  PATIENT GOALS:  Reassess how my recovery is going related to arm function, pain, and swelling.  PAIN:  Are you having pain? {OPRCPAIN:27236}  PRECAUTIONS: Recent Surgery, bilateral UE Lymphedema risk,   RED FLAGS: None   ACTIVITY LEVEL / LEISURE: ***   OBJECTIVE:   PATIENT SURVEYS:  QUICK DASH: ***  OBSERVATIONS: ***  POSTURE:  Forward head and rounded shoulders posture   LYMPHEDEMA ASSESSMENT:  UPPER EXTREMITY AROM/PROM:   A/PROM RIGHT   eval    Shoulder extension    Shoulder flexion    Shoulder abduction    Shoulder internal rotation    Shoulder external rotation                            (Blank rows = not tested)   A/PROM LEFT   eval  Shoulder extension    Shoulder flexion    Shoulder abduction    Shoulder internal rotation    Shoulder external rotation                            (Blank rows = not tested)   CERVICAL AROM: All within normal limits:       Percent limited  Flexion    Extension    Right lateral flexion    Left lateral flexion    Right rotation    Left rotation        UPPER EXTREMITY STRENGTH: WFL   LYMPHEDEMA ASSESSMENTS (in cm):    LANDMARK RIGHT   eval RIGHT 07/22/2024  10 cm proximal to olecranon process 22.9   Olecranon process 22.3   10 cm proximal to ulnar styloid process 20   Just proximal to ulnar styloid process 14.5   Across hand at thumb web space 17.5   At base of 2nd digit 6   (Blank rows = not tested)   LANDMARK LEFT   eval LEFT 07/22/2024  10 cm proximal to olecranon process 22.8   Olecranon process 21.8   10 cm proximal to ulnar styloid process 18.7   Just proximal to ulnar styloid process 14.5   Across hand at thumb web space 16.6   At base of 2nd digit 5.5   (Blank rows = not teste   Surgery type/Date: Bilateral Mastectomies with Bilateral deep axillary SLNB on 05/29/2024 Number of lymph nodes removed: Right 0/2, prior surgery 0/4, Left 0/3 Current/past treatment (chemo, radiation, hormone therapy): past radiation on right Other symptoms:  Heaviness/tightness {yes/no:20286} Pain {yes/no:20286} Pitting edema {yes/no:20286} Infections {yes/no:20286} Decreased scar mobility {yes/no:20286} Stemmer sign {yes/no:20286}  PATIENT EDUCATION:  Education details: *** Person educated: {Person educated:25204} Education method: {Education Method:25205} Education comprehension: {Education Comprehension:25206}  HOME EXERCISE PROGRAM: Reviewed previously given post op HEP. ***  ASSESSMENT:  CLINICAL IMPRESSION: Patient was diagnosed on 04/06/2024 with left grade 2 invasive ductal carcinoma breast cancer. There  are several areas located in the lower inner quadrant measuring 6 mm, 6 mm, 4 mm, and 9 mm. It is ER/PR positive and HER2 negative with a Ki67 of 1%. She has a history of right breast cancer in 2007. She had a right lumpectomy and sentinel node biopsy with 4 nodes removed followed by  radiation. She is now  s/p Bilateral Mastectomies with Bilateral deep axillary SLNB on 05/29/2024  Pt will benefit from skilled therapeutic intervention to improve on the following deficits: Decreased knowledge of precautions, impaired UE functional use, pain, decreased ROM, postural dysfunction.   PT treatment/interventions: ADL/Self care home management, {rehab planned interventions:25118::97110-Therapeutic exercises,97530- Therapeutic [phone]- Neuromuscular re-education,97535- Self Rjmz,02859- Manual therapy}   GOALS: Goals reviewed with patient? {yes/no:20286}  LONG TERM GOALS:  (STG=LTG)  GOALS Name Target Date  Goal status  1 Pt will demonstrate she has regained full shoulder ROM and function post operatively compared to baselines.  Baseline: *** INITIAL  2  *** INITIAL  3  *** INITIAL  4  *** INITIAL     PLAN:  PT FREQUENCY/DURATION: ***  PLAN FOR NEXT SESSION: ***   [facility]  [address]  [place] [address]  [phone]  After Breast Cancer Class Video It is recommended you view the ABC class video to be educated on lymphedema risk reduction. This video lasts for about 30 minutes. It can be viewed on our website here: [email or web address]  Scar massage You can begin gentle scar massage to you incision sites. Gently place one hand on the incision and move the skin (without sliding on the skin) in various directions. Do this for a few minutes and then you can gently massage either coconut oil or vitamin E cream into the scars.  Compression garment You should continue wearing your compression bra until you feel like you no longer have swelling.  Home exercise Program Continue doing the exercises you were given until you feel like you can do them without feeling any tightness at the end.   Walking Program Studies show that 30 minutes of walking  per day (fast enough to elevate your heart rate) can significantly reduce the risk of a cancer recurrence. If you can't walk due to other medical reasons, we encourage you to find another activity you could do (like a stationary bike or water exercise).  Posture After breast cancer surgery, people frequently sit with rounded shoulders posture because it puts their incisions on slack and feels better. If you sit like this and scar tissue forms in that position, you can become very tight and have pain sitting or standing with good posture. Try to be aware of your posture and sit and stand up tall to heal properly.  Follow up PT: It is recommended you return every 3 months for the first 3 years following surgery to be assessed on the SOZO machine for an L-Dex score. This helps prevent clinically significant lymphedema in 95% of patients. These follow up screens are 10 minute appointments that you are not billed for.  [name] JINNY Sheldon, PT 07/21/2024, 7:23 AM

## 2024-07-22 ENCOUNTER — Ambulatory Visit

## 2024-07-28 NOTE — Therapy (Incomplete)
 OUTPATIENT PHYSICAL THERAPY BREAST CANCER POST OP FOLLOW UP   Patient Name: [name] MRN: [id number] DOB:[birth year], 78 y.o., female Today's Date: 07/28/2024  END OF SESSION:   Past Medical History:  Diagnosis Date   Allergy    Anemia    Anxiety    Arthritis    Astigmatism    Bursitis    Bursitis of hip    Cancer (HCC) 10/2006   Invasive ductal carcinoma of the right breast    Carpal tunnel syndrome    Cataracts, bilateral    Closed fracture of multiple pubic rami, left, initial encounter (HCC)    Colitis    Depression    Drusen of both optic discs    Endometriosis    Fibromyalgia    Gall stones    GERD (gastroesophageal reflux disease)    Hyperlipidemia    Pneumonia    Tendon tear    Past Surgical History:  Procedure Laterality Date   APPENDECTOMY     BREAST SURGERY Right 12/11/2006   Lumpectomy   CARPAL TUNNEL RELEASE     CYST EXCISION     ELBOW SURGERY     LUMBAR FUSION     MASTECTOMY W/ SENTINEL NODE BIOPSY Bilateral 05/29/2024   Procedure: BILATERAL MASTECTOMIES WITH SENTINEL LYMPH NODE BIOPSIES;  Surgeon: Curvin Deward MOULD, MD;  Location: [facility] OR;  Service: General;  Laterality: Bilateral;  150 MINUTES BILATERAL MASTECTOMIES BILATERAL SENTINEL NODE BIOPSIES GEN w/PEC BLOCK   Patient Active Problem List   Diagnosis Date Noted   Bilateral breast cancer (HCC) 05/29/2024   Genetic testing 05/26/2024   Malignant neoplasm of lower-inner quadrant of left breast in female, estrogen receptor positive (HCC) 05/11/2024   Acute stress reaction 05/22/2020   Closed fracture of multiple pubic rami, left, initial encounter [facility])    Fall 05/18/2020   Bipolar affective disorder, manic (HCC) 12/28/2017   Breast cancer, right breast (HCC) 02/04/2014   ANEMIA 08/22/2010   DEPRESSION 08/22/2010   HIATAL HERNIA 08/22/2010   DERMATITIS 08/22/2010   CHEST PAIN 08/22/2010   HYPERGLYCEMIA 08/22/2010   HYPERLIPIDEMIA 09/16/2008   ANXIETY DEPRESSION 09/16/2008   HX OF  GALLSTONE 09/16/2008    PCP:   REFERRING PROVIDER:  Dr. Deward Curvin   REFERRING DIAG: Left breast cancer   THERAPY DIAG:  No diagnosis found.  Rationale for Evaluation and Treatment: Rehabilitation  ONSET DATE: 04/06/2024   SUBJECTIVE:                                                                                                                                                                                           SUBJECTIVE STATEMENT: ***  PERTINENT HISTORY:  Patient was diagnosed on 04/06/2024 with left grade 2 invasive ductal carcinoma breast cancer. There are several areas located in the lower inner quadrant measuring 6 mm, 6 mm, 4 mm, and 9 mm. It is ER/PR positive and HER2 negative with a Ki67 of 1%. She has a history of right breast cancer in 2007. She had a right lumpectomy and sentinel node biopsy with 4 nodes removed followed by radiation .She is now s/p Bilateral Mastectomies with Bilateral deep Axillary SLNB on 05/29/2024. She has had some tardive dyskinesia before and after surgery  PATIENT GOALS:  Reassess how my recovery is going related to arm function, pain, and swelling.  PAIN:  Are you having pain? {OPRCPAIN:27236}  PRECAUTIONS: Recent Surgery, bilateral UE Lymphedema risk,   RED FLAGS: None   ACTIVITY LEVEL / LEISURE: ***   OBJECTIVE:   PATIENT SURVEYS:  QUICK DASH: ***  OBSERVATIONS: ***  POSTURE:  Forward head and rounded shoulders posture   LYMPHEDEMA ASSESSMENT:  UPPER EXTREMITY AROM/PROM:   A/PROM RIGHT   eval    Shoulder extension    Shoulder flexion    Shoulder abduction    Shoulder internal rotation    Shoulder external rotation                            (Blank rows = not tested)   A/PROM LEFT   eval  Shoulder extension    Shoulder flexion    Shoulder abduction    Shoulder internal rotation    Shoulder external rotation                            (Blank rows = not tested)   CERVICAL AROM: All within normal limits:       Percent limited  Flexion    Extension    Right lateral flexion    Left lateral flexion    Right rotation    Left rotation        UPPER EXTREMITY STRENGTH: WFL   LYMPHEDEMA ASSESSMENTS (in cm):    LANDMARK RIGHT   eval RIGHT 07/22/2024  10 cm proximal to olecranon process 22.9   Olecranon process 22.3   10 cm proximal to ulnar styloid process 20   Just proximal to ulnar styloid process 14.5   Across hand at thumb web space 17.5   At base of 2nd digit 6   (Blank rows = not tested)   LANDMARK LEFT   eval LEFT 07/22/2024  10 cm proximal to olecranon process 22.8   Olecranon process 21.8   10 cm proximal to ulnar styloid process 18.7   Just proximal to ulnar styloid process 14.5   Across hand at thumb web space 16.6   At base of 2nd digit 5.5   (Blank rows = not teste   Surgery type/Date: Bilateral Mastectomies with Bilateral deep axillary SLNB on 05/29/2024 Number of lymph nodes removed: Right 0/2, prior surgery 0/4, Left 0/3 Current/past treatment (chemo, radiation, hormone therapy): past radiation on right Other symptoms:  Heaviness/tightness {yes/no:20286} Pain {yes/no:20286} Pitting edema {yes/no:20286} Infections {yes/no:20286} Decreased scar mobility {yes/no:20286} Stemmer sign {yes/no:20286}  PATIENT EDUCATION:  Education details: *** Person educated: {Person educated:25204} Education method: {Education Method:25205} Education comprehension: {Education Comprehension:25206}  HOME EXERCISE PROGRAM: Reviewed previously given post op HEP. ***  ASSESSMENT:  CLINICAL IMPRESSION: Patient was diagnosed on 04/06/2024 with left grade 2 invasive ductal carcinoma breast cancer. There  are several areas located in the lower inner quadrant measuring 6 mm, 6 mm, 4 mm, and 9 mm. It is ER/PR positive and HER2 negative with a Ki67 of 1%. She has a history of right breast cancer in 2007. She had a right lumpectomy and sentinel node biopsy with 4 nodes removed followed by  radiation. She is now  s/p Bilateral Mastectomies with Bilateral deep axillary SLNB on 05/29/2024  Pt will benefit from skilled therapeutic intervention to improve on the following deficits: Decreased knowledge of precautions, impaired UE functional use, pain, decreased ROM, postural dysfunction.   PT treatment/interventions: ADL/Self care home management, {rehab planned interventions:25118::97110-Therapeutic exercises,97530- Therapeutic [phone]- Neuromuscular re-education,97535- Self Rjmz,02859- Manual therapy}   GOALS: Goals reviewed with patient? {yes/no:20286}  LONG TERM GOALS:  (STG=LTG)  GOALS Name Target Date  Goal status  1 Pt will demonstrate she has regained full shoulder ROM and function post operatively compared to baselines.  Baseline: *** INITIAL  2  *** INITIAL  3  *** INITIAL  4  *** INITIAL     PLAN:  PT FREQUENCY/DURATION: ***  PLAN FOR NEXT SESSION: ***   [facility]  [address]  [place] [address]  [phone]  After Breast Cancer Class Video It is recommended you view the ABC class video to be educated on lymphedema risk reduction. This video lasts for about 30 minutes. It can be viewed on our website here: [email or web address]  Scar massage You can begin gentle scar massage to you incision sites. Gently place one hand on the incision and move the skin (without sliding on the skin) in various directions. Do this for a few minutes and then you can gently massage either coconut oil or vitamin E cream into the scars.  Compression garment You should continue wearing your compression bra until you feel like you no longer have swelling.  Home exercise Program Continue doing the exercises you were given until you feel like you can do them without feeling any tightness at the end.   Walking Program Studies show that 30 minutes of walking  per day (fast enough to elevate your heart rate) can significantly reduce the risk of a cancer recurrence. If you can't walk due to other medical reasons, we encourage you to find another activity you could do (like a stationary bike or water exercise).  Posture After breast cancer surgery, people frequently sit with rounded shoulders posture because it puts their incisions on slack and feels better. If you sit like this and scar tissue forms in that position, you can become very tight and have pain sitting or standing with good posture. Try to be aware of your posture and sit and stand up tall to heal properly.  Follow up PT: It is recommended you return every 3 months for the first 3 years following surgery to be assessed on the SOZO machine for an L-Dex score. This helps prevent clinically significant lymphedema in 95% of patients. These follow up screens are 10 minute appointments that you are not billed for.  [facility] [place], PT 07/28/2024, 12:52 PM

## 2024-07-30 ENCOUNTER — Ambulatory Visit: Admitting: Physical Therapy

## 2024-09-11 ENCOUNTER — Inpatient Hospital Stay: Admitting: Adult Health

## 2024-09-11 ENCOUNTER — Telehealth: Payer: Self-pay | Admitting: *Deleted

## 2024-09-11 NOTE — Telephone Encounter (Signed)
 Called and left pt vmail to call and reschedule SCP appt. Advised importance of appt briefly on vmail. Scheduling message will be sent to f/u for rescheduling of missed SCP appt

## 2024-09-17 ENCOUNTER — Encounter: Payer: Self-pay | Admitting: Orthopedic Surgery

## 2024-09-17 ENCOUNTER — Ambulatory Visit (INDEPENDENT_AMBULATORY_CARE_PROVIDER_SITE_OTHER): Admitting: Orthopedic Surgery

## 2024-09-17 VITALS — BP 124/86 | HR 96 | Temp 97.5°F | Resp 16 | Ht 65.0 in | Wt 120.6 lb

## 2024-09-17 DIAGNOSIS — E782 Mixed hyperlipidemia: Secondary | ICD-10-CM

## 2024-09-17 DIAGNOSIS — I10 Essential (primary) hypertension: Secondary | ICD-10-CM

## 2024-09-17 DIAGNOSIS — R55 Syncope and collapse: Secondary | ICD-10-CM

## 2024-09-17 DIAGNOSIS — C50312 Malignant neoplasm of lower-inner quadrant of left female breast: Secondary | ICD-10-CM | POA: Diagnosis not present

## 2024-09-17 DIAGNOSIS — G2401 Drug induced subacute dyskinesia: Secondary | ICD-10-CM

## 2024-09-17 DIAGNOSIS — F3110 Bipolar disorder, current episode manic without psychotic features, unspecified: Secondary | ICD-10-CM | POA: Diagnosis not present

## 2024-09-17 DIAGNOSIS — F17210 Nicotine dependence, cigarettes, uncomplicated: Secondary | ICD-10-CM

## 2024-09-17 DIAGNOSIS — K219 Gastro-esophageal reflux disease without esophagitis: Secondary | ICD-10-CM

## 2024-09-17 DIAGNOSIS — M150 Primary generalized (osteo)arthritis: Secondary | ICD-10-CM

## 2024-09-17 DIAGNOSIS — Z170 Estrogen receptor positive status [ER+]: Secondary | ICD-10-CM

## 2024-09-17 NOTE — Patient Instructions (Addendum)
 Please schedule with Dr. Gudena for breast cancer follow up, discuss other options besides anastrozole   Recommend Zoloft for depression and anxiety reduction, do not recommend ativan   Do not take Benadryl > linked to dementia  Recommend stopping Zyrtec and trying different antihistamine like Xyzal or Claritin  Recommend checking blood pressure twice daily x 2 weeks> bring reading next visit   Come fasting next visit> only water 12 hours before visit so we can check your cholesterol

## 2024-09-17 NOTE — Progress Notes (Unsigned)
 Careteam: Patient Care Team: Jerrye Lamar CHRISTELLA Mickey., MD as PCP - General (Family Medicine) Odean Potts, MD as Consulting Physician (Hematology and Oncology) Tyree Nanetta SAILOR, RN as Oncology Nurse Navigator Curvin Deward MOULD, MD as Consulting Physician (General Surgery) Izell Domino, MD as Attending Physician (Radiation Oncology) Dillingham, Estefana RAMAN, DO as Consulting Physician (Plastic Surgery) Yvone Rush, MD as Consulting Physician (Orthopedic Surgery) Bonney Melanie SAUNDERS, MD as Consulting Physician (Dermatology)  Seen by: Greig Cluster, AGNP-C  PLACE OF SERVICE:  [facility]  Advanced Directive information Does Patient Have a Medical Advance Directive?: No, Would patient like information on creating a medical advance directive?: No - Patient declined  Allergies  Allergen Reactions   Augmentin [Amoxicillin-Pot Clavulanate]     unknown   Doxycycline      unknown   Sulfa Drugs Cross Reactors     unknown   Fentanyl  And Related     Other reaction(s): Confusion    Chief Complaint  Patient presents with   Establish Care    New patient.     HPI: Patient is a 78 y.o. female seen today to establish at [facility].   Discussed the use of AI scribe software for clinical note transcription with the patient, who gave verbal consent to proceed.  History of Present Illness    No h/o MI or T2DM.   She has a history of breast cancer, initially diagnosed in the right breast in 2007, treated with lumpectomy and radiation therapy. She did not take tamoxifen post-treatment. Earlier this year, she was diagnosed with cancer in the left breast and underwent a mastectomy in June by Dr. Curvin. She experiences ongoing pain and tightness at the incision site, describing it as feeling 'like she's pulling across my chest.' She applies vitamin E oil to the area but continues to experience discomfort.  She had a follow-up oncology appointment on June 16th, where anastrozole  was recommended, but  she declined due to concerns about hormone-related side effects, including hot flashes. She plans to follow up with oncology but does not have a scheduled appointment.  She has a history of high blood pressure, managed with hydrochlorothiazide.   She has history of high cholesterol, for which she takes simvastatin .   She also has a history of depression, managed by a psychiatrist, Dr. Rosalva, with medications including Seroquel  and mirtazapine . She has been on various antidepressants and anxiolytics in the past, including Wellbutrin , Lexapro , and Ativan , but currently takes lorazepam  0.5 mg as needed for anxiety.  She experiences syncope, having fainted five times, most recently a few weeks ago, often after standing up from a lying position. She reports that the rescue squad suggested her fainting episodes may be related to low blood pressure after standing up from a lying position, but she does not monitor her blood pressure at home. She does not own a blood pressure cuff.  She has a history of heartburn and possibly a hiatal hernia, for which she takes Prevacid. She occasionally uses tizanidine for muscle relaxation and tramadol  for pain, though she notes it causes constipation. She takes gabapentin  three times a day for nerve pain but is concerned about dizziness and fainting.  Socially, she is a retired Runner, broadcasting/film/video and former Publishing copy. She has one daughter and two grandchildren. She does not drive and uses [employer] community for transportation. She walks a mile every morning and is trying to gain weight after losing 45 pounds post-mastectomy. Her diet includes high-cholesterol foods as she attempts to regain weight.  Review of Systems:  Review of Systems  Constitutional: Negative.   HENT: Negative.    Eyes: Negative.   Respiratory: Negative.    Cardiovascular: Negative.   Gastrointestinal:  Positive for heartburn.  Genitourinary: Negative.   Musculoskeletal:  Positive for falls, joint pain and  neck pain.  Skin: Negative.   Neurological:        Syncope  Psychiatric/Behavioral:  Positive for depression. Negative for substance abuse and suicidal ideas. The patient is nervous/anxious.     Past Medical History:  Diagnosis Date   Allergy    Anemia    Anxiety    Arthritis    Astigmatism    Bursitis    Bursitis of hip    Cancer (HCC) 10/2006   Invasive ductal carcinoma of the right breast    Carpal tunnel syndrome    Cataracts, bilateral    Closed fracture of multiple pubic rami, left, initial encounter (HCC)    Colitis    Depression    Drusen of both optic discs    Endometriosis    Fibromyalgia    Gall stones    GERD (gastroesophageal reflux disease)    Hyperlipidemia    Hypertension    IBS (irritable bowel syndrome)    Pneumonia    Tendon tear    Past Surgical History:  Procedure Laterality Date   APPENDECTOMY     BREAST SURGERY Right 12/11/2006   Lumpectomy   CARPAL TUNNEL RELEASE     CYST EXCISION     ELBOW SURGERY     LUMBAR FUSION     MASTECTOMY W/ SENTINEL NODE BIOPSY Bilateral 05/29/2024   Procedure: BILATERAL MASTECTOMIES WITH SENTINEL LYMPH NODE BIOPSIES;  Surgeon: Curvin Deward MOULD, MD;  Location: [facility] OR;  Service: General;  Laterality: Bilateral;  150 MINUTES BILATERAL MASTECTOMIES BILATERAL SENTINEL NODE BIOPSIES GEN w/PEC BLOCK   Social History:   reports that she has been smoking cigarettes. She has never used smokeless tobacco. She reports current drug use. Drug: Marijuana. She reports that she does not drink alcohol.  Family History  Problem Relation Age of Onset   Cancer Mother        unknown primary; chest?   Heart disease Mother    Colon cancer Father 43   Heart disease Father    Transient ischemic attack Father    Vision loss Sister    Heart disease Brother    Heart disease Brother    Breast cancer Paternal Aunt 2   Cancer Paternal Aunt        unknown type; dx >50   Lung cancer Paternal Aunt        oat cell   Lung cancer  Paternal Uncle    Ovarian cancer Maternal Grandmother        dx >50   Cancer Paternal Grandfather        unknown type; dx >50   Ovarian cancer Cousin        dx 57s; pat cousin   Lung cancer Cousin        pat female cousin    Medications: Patient's Medications  New Prescriptions   No medications on file  Previous Medications   ANASTROZOLE  (ARIMIDEX ) 1 MG TABLET    Take 1 tablet (1 mg total) by mouth daily.   BUPROPION  HCL (WELLBUTRIN  XL PO)    Take 1 tablet by mouth daily.   CETIRIZINE (ZYRTEC) 10 MG TABLET    Take 10 mg by mouth every evening.   DEXTROMETHORPHAN-BUPROPION  ER (AUVELITY) 45-105  MG TBCR    Take 1 tablet by mouth in the morning and at bedtime.   DIPHENHYDRAMINE  (BENADRYL ) 25 MG TABLET    Take 1 tablet (25 mg total) by mouth every 6 (six) hours as needed.   ESCITALOPRAM  (LEXAPRO ) 20 MG TABLET    Take 40 mg by mouth daily.   GABAPENTIN  (NEURONTIN ) 100 MG CAPSULE    Take 1 capsule (100 mg total) by mouth 3 (three) times daily as needed.   HYDROCHLOROTHIAZIDE (MICROZIDE) 12.5 MG CAPSULE    Take 12.5 mg by mouth daily.   LANSOPRAZOLE (PREVACID) 15 MG CAPSULE    Take 15 mg by mouth daily.    LORAZEPAM  (ATIVAN ) 0.5 MG TABLET    Take 0.5 mg by mouth daily as needed for anxiety.   LORAZEPAM  (ATIVAN ) 1 MG TABLET    Take 1 tablet (1 mg total) by mouth 3 (three) times daily as needed for anxiety (Tremor).   METHOCARBAMOL  (ROBAXIN ) 500 MG TABLET    Take 1 tablet (500 mg total) by mouth every 8 (eight) hours as needed (use for muscle cramps/pain).   MIRTAZAPINE  (REMERON ) 45 MG TABLET    Take 45 mg by mouth at bedtime.   ONDANSETRON  (ZOFRAN ) 8 MG TABLET    Take 8 mg by mouth every 8 (eight) hours as needed for nausea or vomiting.   OXYCODONE  (ROXICODONE ) 5 MG IMMEDIATE RELEASE TABLET    Take 1 tablet (5 mg total) by mouth every 6 (six) hours as needed for severe pain (pain score 7-10).   QUETIAPINE  (SEROQUEL ) 100 MG TABLET    Take 200 mg by mouth at bedtime.   SIMETHICONE  (GAS-X) 80 MG  CHEWABLE TABLET    Chew 1 tablet (80 mg total) by mouth 4 (four) times daily as needed for flatulence.   SIMVASTATIN  (ZOCOR ) 20 MG TABLET    Take 20 mg by mouth at bedtime.     TIZANIDINE (ZANAFLEX) 2 MG TABLET    Take 2 mg by mouth every 6 (six) hours as needed for muscle spasms.   TRAMADOL  (ULTRAM ) 50 MG TABLET    Take 50 mg by mouth every 6 (six) hours as needed.   VALBENAZINE (INGREZZA) 40 MG CAPSULE    Take 40 mg by mouth daily.   VITAMIN D , ERGOCALCIFEROL , (DRISDOL ) 50000 UNITS CAPS CAPSULE    Take 50,000 Units by mouth every 7 (seven) days. wednesday  Modified Medications   No medications on file  Discontinued Medications   No medications on file    Physical Exam:  Vitals:   09/17/24 1051  BP: 124/86  Pulse: 96  Resp: (!) 96  Temp: (!) 97.5 F (36.4 C)  Weight: 120 lb 9.6 oz (54.7 kg)  Height: 5' 5 (1.651 m)  HC: 16 (40.6 cm)   Body mass index is 20.07 kg/m. Wt Readings from Last 3 Encounters:  09/17/24 120 lb 9.6 oz (54.7 kg)  07/13/24 121 lb 11.1 oz (55.2 kg)  06/08/24 121 lb 11.2 oz (55.2 kg)    Physical Exam Vitals reviewed.  Constitutional:      General: She is not in acute distress. HENT:     Head: Normocephalic.  Eyes:     General:        Right eye: No discharge.        Left eye: No discharge.  Cardiovascular:     Rate and Rhythm: Normal rate and regular rhythm.     Pulses: Normal pulses.     Heart sounds: Normal heart sounds.  Pulmonary:     Effort: Pulmonary effort is normal.     Breath sounds: Normal breath sounds.  Abdominal:     Palpations: Abdomen is soft.  Musculoskeletal:     Cervical back: Neck supple.     Right lower leg: No edema.     Left lower leg: No edema.  Skin:    General: Skin is warm.     Capillary Refill: Capillary refill takes less than 2 seconds.  Neurological:     General: No focal deficit present.     Mental Status: She is alert and oriented to person, place, and time.     Comments: Tardive dyskinesia mouth, upper  extremities  Psychiatric:        Mood and Affect: Mood normal.     Labs reviewed: Basic Metabolic Panel: Recent Labs    05/13/24 1226 05/30/24 0520 07/13/24 1648  NA 139 134* 139  K 4.1 3.9 3.1*  CL 104 98 105  CO2 30 28 21*  GLUCOSE 104* 100* 97  BUN 12 6* 23  CREATININE 0.93 0.87 0.86  CALCIUM 9.3 8.6* 9.8  TSH  --   --  2.908   Liver Function Tests: Recent Labs    05/13/24 1226  AST 11*  ALT 6  ALKPHOS 51  BILITOT 0.3  PROT 6.7  ALBUMIN  4.1   No results for input(s): LIPASE, AMYLASE in the last 8760 hours. No results for input(s): AMMONIA in the last 8760 hours. CBC: Recent Labs    05/13/24 1226 05/30/24 0520 07/13/24 1648  WBC 6.3 10.5 12.7*  NEUTROABS 3.7  --   --   HGB 13.8 13.7 15.5*  HCT 39.5 40.0 44.7  MCV 92.1 93.7 89.0  PLT 213 246 287   Lipid Panel: No results for input(s): CHOL, HDL, LDLCALC, TRIG, CHOLHDL, LDLDIRECT in the last 8760 hours. TSH: Recent Labs    07/13/24 1648  TSH 2.908   A1C: No results found for: HGBA1C   Assessment/Plan 1. Tardive dyskinesia (Primary) - suspect SE to chronic antipsychotic use - involves mouth and upper extremities - cont valbenazine  2. Bipolar affective disorder, current episode manic, current episode severity unspecified (HCC) - followed by Dr. Rosalva in [place] - stable mood, but reports intermittent anxiety - cont  Auvelity, mirtrazepine and Seroquel  - Lexapro  was recently decreased - recommend restarting Lexapro  and stopping ativan  prn> especially due to recent episodes of syncope  3. Malignant neoplasm of lower-inner quadrant of left breast in female, estrogen receptor positive (HCC) - s/p mastectomy 07/08 by Dr. Curvin - followed by oncology - anastrozole  recommended> not taking - advised to schedule f/u with oncology to discuss medication/treatment/ surveillance options  4. Primary hypertension - reports episodes of syncope within past few months - on  hydrochlorothiazide - ? Dehydration - advised to take blood pressured twice daily x 14 days> bring readings next visit  5. Mixed hyperlipidemia - no recent lipid panel per chart review - cont simvastatin  - fasting lipid panel next visit   6. Gastroesophageal reflux disease without esophagitis - hgb 15.5 07/13/2024 - cont lansoprazole  7. Syncope, unspecified syncope type - reports a few episodes within past few months - hgb normal - ? Low blood pressure> on hydrochlorothiazide - ? Gabapentin  SE - see above  8. Primary osteoarthritis involving multiple joints - neck, back and knees most bothersome - cont tizanidine and tramadol  prn  9. Smokes cigarettes - per chart review - recommend low dose CT chest to r/o lung cancer  -  emphysema changes noted last CXR  - recommend Prevnar 20    Total time: 61 minutes. Greater than 50% of total time spent doing patient education regarding health maintenance, recent breast cancer, biopolar depression, OA, HTN, HLD, OA and GERD including symptom/medication management.   Future labs/tests: discuss smoking history, CT chest r/o lung cancer, Prevnar 20, recheck blood pressures, cbc/diff, cmp, lipid panel, DEXA scan    Next appt: 10/15/2024  Greig Gil BODILY  [facility] [phone]

## 2024-09-18 ENCOUNTER — Encounter: Payer: Self-pay | Admitting: Orthopedic Surgery

## 2024-10-15 ENCOUNTER — Ambulatory Visit: Admitting: Orthopedic Surgery

## 2024-10-16 ENCOUNTER — Encounter: Payer: Self-pay | Admitting: *Deleted

## 2024-10-28 NOTE — Therapy (Signed)
 OUTPATIENT PHYSICAL THERAPY  UPPER EXTREMITY ONCOLOGY EVALUATION  Patient Name: [name] MRN: [id number] DOB:1946/02/17, 78 y.o., female Today's Date: 10/29/2024  END OF SESSION:  PT End of Session - 10/29/24 1251     Visit Number 1    Number of Visits 8    Date for Recertification  11/26/24    Authorization Type BCBC;not required    PT Start Time 1204    PT Stop Time 1251    PT Time Calculation (min) 47 min    Activity Tolerance Patient tolerated treatment well    Behavior During Therapy WFL for tasks assessed/performed          Past Medical History:  Diagnosis Date   Allergy    Anemia    Anxiety    Arthritis    Astigmatism    Bursitis    Bursitis of hip    Cancer (HCC) 10/2006   Invasive ductal carcinoma of the right breast    Carpal tunnel syndrome    Cataracts, bilateral    Closed fracture of multiple pubic rami, left, initial encounter (HCC)    Colitis    Depression    Drusen of both optic discs    Endometriosis    Fibromyalgia    Gall stones    GERD (gastroesophageal reflux disease)    Hyperlipidemia    Hypertension    IBS (irritable bowel syndrome)    Pneumonia    Tendon tear    Past Surgical History:  Procedure Laterality Date   APPENDECTOMY     BREAST SURGERY Right 12/11/2006   Lumpectomy   CARPAL TUNNEL RELEASE     CYST EXCISION     ELBOW SURGERY     LUMBAR FUSION     MASTECTOMY W/ SENTINEL NODE BIOPSY Bilateral 05/29/2024   Procedure: BILATERAL MASTECTOMIES WITH SENTINEL LYMPH NODE BIOPSIES;  Surgeon: Curvin Deward MOULD, MD;  Location: [facility] OR;  Service: General;  Laterality: Bilateral;  150 MINUTES BILATERAL MASTECTOMIES BILATERAL SENTINEL NODE BIOPSIES GEN w/PEC BLOCK   Patient Active Problem List   Diagnosis Date Noted   Bilateral breast cancer (HCC) 05/29/2024   Genetic testing 05/26/2024   Malignant neoplasm of lower-inner quadrant of left breast in female, estrogen receptor positive (HCC) 05/11/2024   Acute stress reaction 05/22/2020    Closed fracture of multiple pubic rami, left, initial encounter [facility])    Fall 05/18/2020   Bipolar affective disorder, manic (HCC) 12/28/2017   Breast cancer, right breast (HCC) 02/04/2014   ANEMIA 08/22/2010   DEPRESSION 08/22/2010   HIATAL HERNIA 08/22/2010   DERMATITIS 08/22/2010   CHEST PAIN 08/22/2010   HYPERGLYCEMIA 08/22/2010   HYPERLIPIDEMIA 09/16/2008   ANXIETY DEPRESSION 09/16/2008   HX OF GALLSTONE 09/16/2008    PCP:   REFERRING PROVIDER: Deward Curvin III, MD  REFERRING DIAG: s/p Bilateral Breast Cancer  THERAPY DIAG:  Malignant neoplasm of lower-inner quadrant of left breast in female, estrogen receptor positive (HCC)  Abnormal posture  Malignant neoplasm of overlapping sites of right female breast, unspecified estrogen receptor status (HCC)  ONSET DATE: 05/29/2024  Rationale for Evaluation and Treatment: Rehabilitation  SUBJECTIVE:  SUBJECTIVE STATEMENT:  Pt is having pain in her chest on both sides due to tightness s/p bilateral Mastectomies for bilateral Breast Cancer. Shoulder ROM is doing well per pt.   PERTINENT HISTORY:  status post right mastectomy 05/29/2024 for a T1 MIC N0 right breast cancer that was triple negative with a Ki-67 of 30%. She also had a left mastectomy and sentinel node biopsy for a T1c N0 left breast cancer that was ER and PR positive and HER2 negative with a Ki-67 of 5%. She tolerated the surgery well.   PAIN:  Are you having pain? Yes NPRS scale: 5/10 Pain location: bilateral chest Pain orientation: Bilateral  PAIN TYPE: tight Pain description: constant  Aggravating factors: nothing Relieving factors: Vit E helps some  PRECAUTIONS: Tardive dyskinesia (takes meds)  RED FLAGS: None   WEIGHT BEARING RESTRICTIONS: No  FALLS:  Has patient fallen  in last 6 months? No  LIVING ENVIRONMENT: Lives with: lives alone Lives in: House/apartment   OCCUPATION: none  LEISURE: AA meetings daily, watch the news  HAND DOMINANCE: right   PRIOR LEVEL OF FUNCTION: Independent  PATIENT GOALS: Get rid of the pain in her chest   OBJECTIVE: Note: Objective measures were completed at Evaluation unless otherwise noted.  COGNITION: Overall cognitive status: Within functional limits for tasks assessed   PALPATION: Mild tenderness right axillar corder of pectorals  OBSERVATIONS / OTHER ASSESSMENTS: incisions nicely healed  SENSATION: Light touch: Deficits     POSTURE: forward head rounded shoulders, increased kyphosis, slumped sitting posture  UPPER EXTREMITY AROM/PROM: Pt reports no problems with shoulders. Left ROM has always been a bit better  A/PROM RIGHT   eval   Shoulder extension 63, mild tight  Shoulder flexion 122  Shoulder abduction 155  Shoulder internal rotation 65  Shoulder external rotation 88    (Blank rows = not tested)  A/PROM LEFT   eval  Shoulder extension 70  Shoulder flexion 130  Shoulder abduction 156  Shoulder internal rotation 73  Shoulder external rotation 80    (Blank rows = not tested)  CERVICAL AROM: All within fxl limits:      UPPER EXTREMITY STRENGTH:   LYMPHEDEMA ASSESSMENTS:   SURGERY TYPE/DATE: BILATERAL MASTECTOMIES WITH SENTINEL LYMPH NODE BIOPSIES 05/29/2024  Breast Surgery Right 12/11/2006  NUMBER OF LYMPH NODES REMOVED: 0/2 on right, 0/2 left  CHEMOTHERAPY: NO  RADIATION:NO, Prior radiation on right in 2007  HORMONE TREATMENT: will be on Anastrozole  5-7 years  INFECTIONS: NO   LYMPHEDEMA ASSESSMENTS:   LANDMARK RIGHT  eval  At axilla    15 cm proximal to the proximal aspect of the olecranon process   10 cm proximal to the proximal aspect of the olecranon process 22.4  Olecranon process 21.7  15 cm proximal to the proximal aspect of the ulnar styloid process    10 cm proximal to the proximal aspect of the ulnar styloid process 19.3  Just distal to the ulnar styloid process 15.3  Across hand at thumb web space 17.6  At base of 2nd digit 5.8  (Blank rows = not tested)  LANDMARK LEFT  eval  At axilla    15 cm proximal to the proximal aspect of the olecranon process   10 cm proximal to the proximal aspect of the olecranon process 22.0  Olecranon process 21.5  15 cm proximal to the proximal aspect of the ulnar styloid process   10 cm proximal to  the proximal aspect of the ulnar styloid process 18.4  Just  distal to the ulnar styloid process 15.3  Across hand at thumb web space 18.0  At base of 2nd digit 5.4  (Blank rows = not tested)  Chest circumference just inferior to the axillae:  Chest circumference at the largest point:      QUICK DASH SURVEY: NA                                                                                                                            TREATMENT DATE:  10/29/2024 Educated in editor, commissioning stretch supine x 5, supine wand scaption x 5 and bilateral corner stretch x 3 reps. Discussed POC, LOS and treatment interventions. Importance of proper posture to prevent tightness     PATIENT EDUCATION:  Education details: stargazer stretch supine x 5, supine wand scaption x 5 and bilateral corner stretch x 3 reps, scar massage Discussed POC, LOS and treatment interventions Person educated: Patient Education method: Programmer, Multimedia, Demonstration, and Handouts Education comprehension: verbalized understanding and returned demonstration  HOME EXERCISE PROGRAM: Stargazer, supine wand scaption 5 reps each corner pec stretch 3 reps all 2 x/day, scar massage  ASSESSMENT:  CLINICAL IMPRESSION: Patient is a 78 y.o. female who was seen today for physical therapy evaluation and treatment s/p Bilateral Mastectomies with SLNB's for bilateral breast cancers with right being triple negative. She presents with ROM WNL for her,  and main complaint is tightness in her chest like a belt wrapped around her. She was educated in 3 chest stretches above  to perform 2x's per day to tightness only and not pain. She will try and be more aware of her posture.  No concerns for lymphedema presently.She will benefit from skilled therapy to address deficits and return to PLOF.   OBJECTIVE IMPAIRMENTS: decreased knowledge of condition, decreased mobility, impaired flexibility, postural dysfunction, and pain.   ACTIVITY LIMITATIONS: carrying and reach over head  PARTICIPATION LIMITATIONS: able to do all but with chest tightness  PERSONAL FACTORS: Age and 1-2 comorbidities: bilateral Mastectomies are also affecting patient's functional outcome.   REHAB POTENTIAL: Good  CLINICAL DECISION MAKING: Stable/uncomplicated  EVALUATION COMPLEXITY: Low  GOALS: Goals reviewed with patient? Yes  SHORT TERM GOALS=LONG TERM GOALS: Target date:   Pt will be independent in a HEP to stretch bilateral pectorals Baseline: Goal status: INITIAL  2.  Pt will report decreased chest pain by atleast 50% Baseline:  Goal status: INITIAL  3.  Pt will be educated in precautions to prevent Lymphedema and will given info to watch video Baseline:  Goal status: INITIAL  4. Pt will be independent in posture correction  Goal Status: INITIAL PLAN:  PT FREQUENCY: 1-2x/week  PT DURATION: 4 weeks  PLANNED INTERVENTIONS: 97164- PT Re-evaluation, 97110-Therapeutic exercises, 97530- Therapeutic activity, 97112- Neuromuscular re-education, 97535- Self Care, 02859- Manual therapy, and 97760- Orthotic Initial  PLAN FOR NEXT SESSION: review HEP and progress, review scar massage prn, MFR to chest, progress to strength ie postural TB  [name] JINNY Sheldon, PT 10/29/2024,  12:52 PM

## 2024-10-29 ENCOUNTER — Ambulatory Visit: Attending: General Surgery

## 2024-10-29 ENCOUNTER — Other Ambulatory Visit: Payer: Self-pay

## 2024-10-29 DIAGNOSIS — Z9013 Acquired absence of bilateral breasts and nipples: Secondary | ICD-10-CM | POA: Diagnosis present

## 2024-10-29 DIAGNOSIS — R293 Abnormal posture: Secondary | ICD-10-CM | POA: Insufficient documentation

## 2024-10-29 DIAGNOSIS — C50312 Malignant neoplasm of lower-inner quadrant of left female breast: Secondary | ICD-10-CM | POA: Diagnosis present

## 2024-10-29 DIAGNOSIS — C50811 Malignant neoplasm of overlapping sites of right female breast: Secondary | ICD-10-CM | POA: Insufficient documentation

## 2024-10-29 DIAGNOSIS — Z170 Estrogen receptor positive status [ER+]: Secondary | ICD-10-CM | POA: Diagnosis present

## 2024-10-29 NOTE — Patient Instructions (Signed)
  SHOULDER: Flexion - Supine (Cane)        Cancer Rehab [phone]    Hold cane in both hands. Raise arms up overhead. Do not allow back to arch. Hold _5__ seconds. Do __5__ times; __2__ times a day.  Hands wider than shoulder width; V positions   Shoulder Blade Stretch    Clasp fingers behind head with elbows touching in front of face. Pull elbows back while pressing shoulder blades together. Relax and hold as tolerated, can place pillow under elbow here for comfort as needed and to allow for prolonged stretch.  Repeat __5__ times. Do __1-2__ sessions per day.

## 2024-11-03 ENCOUNTER — Ambulatory Visit

## 2024-11-03 ENCOUNTER — Ambulatory Visit: Admitting: Neurology

## 2024-11-05 ENCOUNTER — Ambulatory Visit: Payer: Self-pay | Admitting: Orthopedic Surgery

## 2024-11-10 ENCOUNTER — Ambulatory Visit

## 2024-11-10 DIAGNOSIS — Z9013 Acquired absence of bilateral breasts and nipples: Secondary | ICD-10-CM

## 2024-11-10 DIAGNOSIS — R293 Abnormal posture: Secondary | ICD-10-CM

## 2024-11-10 DIAGNOSIS — C50312 Malignant neoplasm of lower-inner quadrant of left female breast: Secondary | ICD-10-CM

## 2024-11-10 DIAGNOSIS — C50811 Malignant neoplasm of overlapping sites of right female breast: Secondary | ICD-10-CM

## 2024-11-10 NOTE — Therapy (Signed)
 OUTPATIENT PHYSICAL THERAPY  UPPER EXTREMITY ONCOLOGY EVALUATION  Patient Name: [name] MRN: [id number] DOB:July 13, 1946, 78 y.o., female Today's Date: 11/10/2024  END OF SESSION:  PT End of Session - 11/10/24 1202     Visit Number 2    Number of Visits 8    Date for Recertification  11/26/24    Authorization Type BCBC;not required    PT Start Time 1203    PT Stop Time 1257    PT Time Calculation (min) 54 min    Activity Tolerance Patient tolerated treatment well    Behavior During Therapy [facility] for tasks assessed/performed          Past Medical History:  Diagnosis Date   Allergy    Anemia    Anxiety    Arthritis    Astigmatism    Bursitis    Bursitis of hip    Cancer (HCC) 10/2006   Invasive ductal carcinoma of the right breast    Carpal tunnel syndrome    Cataracts, bilateral    Closed fracture of multiple pubic rami, left, initial encounter (HCC)    Colitis    Depression    Drusen of both optic discs    Endometriosis    Fibromyalgia    Gall stones    GERD (gastroesophageal reflux disease)    Hyperlipidemia    Hypertension    IBS (irritable bowel syndrome)    Pneumonia    Tendon tear    Past Surgical History:  Procedure Laterality Date   APPENDECTOMY     BREAST SURGERY Right 12/11/2006   Lumpectomy   CARPAL TUNNEL RELEASE     CYST EXCISION     ELBOW SURGERY     LUMBAR FUSION     MASTECTOMY W/ SENTINEL NODE BIOPSY Bilateral 05/29/2024   Procedure: BILATERAL MASTECTOMIES WITH SENTINEL LYMPH NODE BIOPSIES;  Surgeon: Curvin Deward MOULD, MD;  Location: [facility] OR;  Service: General;  Laterality: Bilateral;  150 MINUTES BILATERAL MASTECTOMIES BILATERAL SENTINEL NODE BIOPSIES GEN w/PEC BLOCK   Patient Active Problem List   Diagnosis Date Noted   Bilateral breast cancer (HCC) 05/29/2024   Genetic testing 05/26/2024   Malignant neoplasm of lower-inner quadrant of left breast in female, estrogen receptor positive (HCC) 05/11/2024   Acute stress reaction 05/22/2020    Closed fracture of multiple pubic rami, left, initial encounter (HCC)    Fall 05/18/2020   Bipolar affective disorder, manic (HCC) 12/28/2017   Breast cancer, right breast (HCC) 02/04/2014   ANEMIA 08/22/2010   DEPRESSION 08/22/2010   HIATAL HERNIA 08/22/2010   DERMATITIS 08/22/2010   CHEST PAIN 08/22/2010   HYPERGLYCEMIA 08/22/2010   HYPERLIPIDEMIA 09/16/2008   ANXIETY DEPRESSION 09/16/2008   HX OF GALLSTONE 09/16/2008    PCP:   REFERRING PROVIDER: Deward Curvin MOULD, MD  REFERRING DIAG: s/p Bilateral Breast Cancer  THERAPY DIAG:  Malignant neoplasm of lower-inner quadrant of left breast in female, estrogen receptor positive (HCC)  Abnormal posture  Malignant neoplasm of overlapping sites of right female breast, unspecified estrogen receptor status (HCC)  History of bilateral mastectomy  ONSET DATE: 05/29/2024  Rationale for Evaluation and Treatment: Rehabilitation  SUBJECTIVE:  SUBJECTIVE STATEMENT:  I am doing the exercises but only 1 time per day. I have been doing the scar massage. Today will be my last visit because I can't get rides. Chest pain is overall better  PERTINENT HISTORY:  status post right mastectomy 05/29/2024 for a T1 MIC N0 right breast cancer that was triple negative with a Ki-67 of 30%. She also had a left mastectomy and sentinel node biopsy for a T1c N0 left breast cancer that was ER and PR positive and HER2 negative with a Ki-67 of 5%. She tolerated the surgery well.   PAIN:  Are you having pain? None Relieving factors: Vit E helps some  PRECAUTIONS: Tardive dyskinesia (takes meds)  RED FLAGS: None   WEIGHT BEARING RESTRICTIONS: No  FALLS:  Has patient fallen in last 6 months? No  LIVING ENVIRONMENT: Lives with: lives alone Lives in:  House/apartment   OCCUPATION: none  LEISURE: AA meetings daily, watch the news  HAND DOMINANCE: right   PRIOR LEVEL OF FUNCTION: Independent  PATIENT GOALS: Get rid of the pain in her chest   OBJECTIVE: Note: Objective measures were completed at Evaluation unless otherwise noted.  COGNITION: Overall cognitive status: Within functional limits for tasks assessed   PALPATION: Mild tenderness right axillar corder of pectorals  OBSERVATIONS / OTHER ASSESSMENTS: incisions nicely healed  SENSATION: Light touch: Deficits     POSTURE: forward head rounded shoulders, increased kyphosis, slumped sitting posture  UPPER EXTREMITY AROM/PROM: Pt reports no problems with shoulders. Left ROM has always been a bit better  A/PROM RIGHT   eval  RIGHT 11/10/2024  Shoulder extension 63, mild tight   Shoulder flexion 122 135  Shoulder abduction 155 155  Shoulder internal rotation 65   Shoulder external rotation 88 95    (Blank rows = not tested)  A/PROM LEFT   eval LEFT 11/10/2024  Shoulder extension 70   Shoulder flexion 130 135  Shoulder abduction 156 155  Shoulder internal rotation 73   Shoulder external rotation 80 94    (Blank rows = not tested)  CERVICAL AROM: All within fxl limits:      UPPER EXTREMITY STRENGTH:   LYMPHEDEMA ASSESSMENTS:   SURGERY TYPE/DATE: BILATERAL MASTECTOMIES WITH SENTINEL LYMPH NODE BIOPSIES 05/29/2024  Breast Surgery Right 12/11/2006  NUMBER OF LYMPH NODES REMOVED: 0/2 on right, 0/2 left  CHEMOTHERAPY: NO  RADIATION:NO, Prior radiation on right in 2007  HORMONE TREATMENT: will be on Anastrozole  5-7 years  INFECTIONS: NO   LYMPHEDEMA ASSESSMENTS:   LANDMARK RIGHT  eval  At axilla    15 cm proximal to the proximal aspect of the olecranon process   10 cm proximal to the proximal aspect of the olecranon process 22.4  Olecranon process 21.7  15 cm proximal to the proximal aspect of the ulnar styloid process   10 cm proximal to  the proximal aspect of the ulnar styloid process 19.3  Just distal to the ulnar styloid process 15.3  Across hand at thumb web space 17.6  At base of 2nd digit 5.8  (Blank rows = not tested)  LANDMARK LEFT  eval  At axilla    15 cm proximal to the proximal aspect of the olecranon process   10 cm proximal to the proximal aspect of the olecranon process 22.0  Olecranon process 21.5  15 cm proximal to the proximal aspect of the ulnar styloid process   10 cm proximal to  the proximal aspect of the ulnar styloid process 18.4  Just distal  to the ulnar styloid process 15.3  Across hand at thumb web space 18.0  At base of 2nd digit 5.4  (Blank rows = not tested)  Chest circumference just inferior to the axillae:  Chest circumference at the largest point:      QUICK DASH SURVEY: NA                                                                                                                            TREATMENT DATE:  11/10/2024 Discussed lymphedema and gave info for video to watch. Discussed BP and taking on left due to XRT on Right and possibly more LN's removed on right. Checked incisions; look great; thin and mobile incisions Supine wand flexion and scaption x 5 ea Stargazer x 5 Snow angels x 5 B Scapular retraction and shoulder extension yellow band x 10 Updated HEP and gave band Measured AROM and checked goals for DC  10/29/2024 Educated in editor, commissioning stretch supine x 5, supine wand scaption x 5 and bilateral corner stretch x 3 reps. Discussed POC, LOS and treatment interventions. Importance of proper posture to prevent tightness     PATIENT EDUCATION:  Access Code: [email or web address] URL: [email or web address] Date: 11/10/2024 Prepared by: [name] Sheldon  Exercises  Scapular retraction and shoulder ext with yellow band x 10 - Single Arm Doorway Pec Stretch at 90 Degrees Abduction  - 2 x daily - 7 x weekly - 1 sets - 3 reps - 20 hold - Supine Shoulder Abduction  AROM  - 2 x daily - 7 x weekly - 1 sets - 5 reps Education details: stargazer stretch supine x 5, supine wand scaption x 5 and bilateral corner stretch x 3 reps, scar massage Discussed POC, LOS and treatment interventions Person educated: Patient Education method: Explanation, Demonstration, and Handouts Education comprehension: verbalized understanding and returned demonstration  HOME EXERCISE PROGRAM: Stargazer, supine wand scaption 5 reps each corner pec stretch 3 reps all 2 x/day, scar massage Snow angels, single arm doorway stretch, SR and shoulder ext with yellow band ASSESSMENT:  CLINICAL IMPRESSION:  Pt has achieved 3 of 4 goals established at initial evaluation. Pain has improved by atleast 50% and is no longer constant or as intense. She has been compliant with HEP 1x/day. She was given the information to watch the [employer] video on our web site, and her HEP was updated. She has made good improvement with her bilateral shoulder ROM. She feels ready to be discharged to HEP.  OBJECTIVE IMPAIRMENTS: decreased knowledge of condition, decreased mobility, impaired flexibility, postural dysfunction, and pain.   ACTIVITY LIMITATIONS: carrying and reach over head  PARTICIPATION LIMITATIONS: able to do all but with chest tightness  PERSONAL FACTORS: Age and 1-2 comorbidities: bilateral Mastectomies are also affecting patient's functional outcome.   REHAB POTENTIAL: Good  CLINICAL DECISION MAKING: Stable/uncomplicated  EVALUATION COMPLEXITY: Low  GOALS: Goals reviewed with patient? Yes  SHORT TERM GOALS=LONG TERM GOALS: Target date:   Pt will be independent  in a HEP to stretch bilateral pectorals Baseline: Goal status: MET 2.  Pt will report decreased chest pain by atleast 50% Baseline:  Goal status: MET 11/10/2024 3.  Pt will be educated in precautions to prevent Lymphedema and will given info to watch video Baseline:  Goal status:MET Gave video link today/discussed briefly 4.  Pt will be independent in posture correction  Goal Status: In PROGRESS PLAN:  PT FREQUENCY: 1-2x/week  PT DURATION: 4 weeks  PLANNED INTERVENTIONS: 97164- PT Re-evaluation, 97110-Therapeutic exercises, 97530- Therapeutic activity, 97112- Neuromuscular re-education, 97535- Self Care, 02859- Manual therapy, and 97760- Orthotic Initial  PLAN FOR NEXT SESSION: review HEP and progress, review scar massage prn, MFR to chest, progress to strength ie postural TB PHYSICAL THERAPY DISCHARGE SUMMARY  Visits from Start of Care: 2  Current functional level related to goals / functional outcomes: Achieved most goals   Remaining deficits: Not yet independent with posture correction, but know importance   Education / Equipment: HEP/theraband   Patient agrees to discharge. Patient goals were partially met. Patient is being discharged due to being pleased with the current functional level.  [name] JINNY Sheldon, PT 11/10/2024, 1:05 PM

## 2024-11-17 ENCOUNTER — Ambulatory Visit

## 2024-11-24 ENCOUNTER — Ambulatory Visit

## 2024-11-26 ENCOUNTER — Ambulatory Visit: Admitting: Orthopedic Surgery

## 2024-12-21 ENCOUNTER — Inpatient Hospital Stay: Admitting: Adult Health
